# Patient Record
Sex: Male | Born: 1940 | Race: White | Hispanic: No | Marital: Married | Smoking: Former smoker
Health system: Southern US, Community
[De-identification: ages and names within clinical notes are randomized; demographics above are authoritative.]

## PROBLEM LIST (undated history)

## (undated) DIAGNOSIS — I10 Essential (primary) hypertension: Secondary | ICD-10-CM

## (undated) DIAGNOSIS — I219 Acute myocardial infarction, unspecified: Secondary | ICD-10-CM

## (undated) DIAGNOSIS — K56609 Unspecified intestinal obstruction, unspecified as to partial versus complete obstruction: Secondary | ICD-10-CM

## (undated) DIAGNOSIS — I2510 Atherosclerotic heart disease of native coronary artery without angina pectoris: Secondary | ICD-10-CM

## (undated) DIAGNOSIS — E785 Hyperlipidemia, unspecified: Secondary | ICD-10-CM

## (undated) DIAGNOSIS — R0609 Other forms of dyspnea: Secondary | ICD-10-CM

## (undated) DIAGNOSIS — Z720 Tobacco use: Secondary | ICD-10-CM

## (undated) DIAGNOSIS — J449 Chronic obstructive pulmonary disease, unspecified: Secondary | ICD-10-CM

## (undated) DIAGNOSIS — K219 Gastro-esophageal reflux disease without esophagitis: Secondary | ICD-10-CM

## (undated) DIAGNOSIS — R0600 Dyspnea, unspecified: Secondary | ICD-10-CM

## (undated) DIAGNOSIS — K625 Hemorrhage of anus and rectum: Secondary | ICD-10-CM

## (undated) HISTORY — DX: Tobacco use: Z72.0

## (undated) HISTORY — DX: Gastro-esophageal reflux disease without esophagitis: K21.9

## (undated) HISTORY — DX: Essential (primary) hypertension: I10

## (undated) HISTORY — DX: Atherosclerotic heart disease of native coronary artery without angina pectoris: I25.10

## (undated) HISTORY — DX: Hyperlipidemia, unspecified: E78.5

## (undated) HISTORY — PX: CORONARY ANGIOPLASTY WITH STENT PLACEMENT: SHX49

## (undated) HISTORY — DX: Dyspnea, unspecified: R06.00

## (undated) HISTORY — PX: PTCA: SHX146

## (undated) HISTORY — DX: Hemorrhage of anus and rectum: K62.5

## (undated) HISTORY — DX: Other forms of dyspnea: R06.09

---

## 1969-02-13 HISTORY — PX: EYE SURGERY: SHX253

## 2003-07-15 ENCOUNTER — Inpatient Hospital Stay (HOSPITAL_COMMUNITY): Admission: EM | Admit: 2003-07-15 | Discharge: 2003-07-18 | Payer: Self-pay | Admitting: Family Medicine

## 2003-08-24 ENCOUNTER — Encounter (HOSPITAL_COMMUNITY): Admission: RE | Admit: 2003-08-24 | Discharge: 2003-11-22 | Payer: Self-pay | Admitting: Cardiology

## 2003-11-23 ENCOUNTER — Encounter (HOSPITAL_COMMUNITY): Admission: RE | Admit: 2003-11-23 | Discharge: 2004-01-18 | Payer: Self-pay | Admitting: Cardiology

## 2003-12-22 ENCOUNTER — Ambulatory Visit: Payer: Self-pay | Admitting: Cardiology

## 2003-12-30 ENCOUNTER — Ambulatory Visit: Payer: Self-pay | Admitting: *Deleted

## 2004-01-19 ENCOUNTER — Encounter (HOSPITAL_COMMUNITY): Admission: RE | Admit: 2004-01-19 | Discharge: 2004-02-14 | Payer: Self-pay | Admitting: Cardiology

## 2004-03-30 ENCOUNTER — Ambulatory Visit: Payer: Self-pay

## 2004-07-06 ENCOUNTER — Ambulatory Visit: Payer: Self-pay | Admitting: *Deleted

## 2004-09-29 ENCOUNTER — Ambulatory Visit: Payer: Self-pay | Admitting: Internal Medicine

## 2004-09-30 ENCOUNTER — Ambulatory Visit: Payer: Self-pay | Admitting: Cardiology

## 2005-02-22 ENCOUNTER — Ambulatory Visit: Payer: Self-pay | Admitting: Cardiology

## 2005-11-06 ENCOUNTER — Emergency Department (HOSPITAL_COMMUNITY): Admission: EM | Admit: 2005-11-06 | Discharge: 2005-11-06 | Payer: Self-pay | Admitting: Family Medicine

## 2005-11-15 ENCOUNTER — Ambulatory Visit: Payer: Self-pay | Admitting: Cardiology

## 2005-11-21 ENCOUNTER — Ambulatory Visit: Payer: Self-pay | Admitting: Cardiology

## 2006-02-13 HISTORY — PX: RECTAL SURGERY: SHX760

## 2006-03-26 ENCOUNTER — Ambulatory Visit: Payer: Self-pay | Admitting: Cardiology

## 2006-04-02 ENCOUNTER — Ambulatory Visit (HOSPITAL_COMMUNITY): Admission: RE | Admit: 2006-04-02 | Discharge: 2006-04-02 | Payer: Self-pay | Admitting: General Surgery

## 2006-04-02 ENCOUNTER — Encounter (INDEPENDENT_AMBULATORY_CARE_PROVIDER_SITE_OTHER): Payer: Self-pay | Admitting: *Deleted

## 2006-05-29 ENCOUNTER — Ambulatory Visit: Payer: Self-pay | Admitting: Cardiology

## 2006-12-24 ENCOUNTER — Ambulatory Visit: Payer: Self-pay | Admitting: Cardiology

## 2006-12-24 LAB — CONVERTED CEMR LAB
ALT: 22 units/L (ref 0–53)
AST: 20 units/L (ref 0–37)
Albumin: 3.9 g/dL (ref 3.5–5.2)
Alkaline Phosphatase: 70 units/L (ref 39–117)
Cholesterol: 101 mg/dL (ref 0–200)
HDL: 26.7 mg/dL — ABNORMAL LOW (ref >39.0)
LDL Cholesterol: 57 mg/dL (ref 0–99)
Total CHOL/HDL Ratio: 3.8
Total Protein: 6.7 g/dL (ref 6.0–8.3)
VLDL: 18 mg/dL (ref 0–40)

## 2006-12-25 ENCOUNTER — Ambulatory Visit: Payer: Self-pay | Admitting: Cardiology

## 2006-12-25 ENCOUNTER — Ambulatory Visit: Payer: Self-pay

## 2008-01-15 ENCOUNTER — Ambulatory Visit: Payer: Self-pay | Admitting: Cardiology

## 2008-01-23 ENCOUNTER — Ambulatory Visit: Payer: Self-pay | Admitting: Cardiology

## 2008-01-23 LAB — CONVERTED CEMR LAB
Alkaline Phosphatase: 74 units/L (ref 39–117)
Bilirubin, Direct: 0.1 mg/dL (ref 0.0–0.3)
LDL Cholesterol: 81 mg/dL (ref 0–99)
Total Bilirubin: 0.6 mg/dL (ref 0.3–1.2)
Total CHOL/HDL Ratio: 4.1
VLDL: 12 mg/dL (ref 0–40)

## 2008-03-03 ENCOUNTER — Ambulatory Visit: Payer: Self-pay | Admitting: Cardiology

## 2008-03-03 LAB — CONVERTED CEMR LAB
Cholesterol: 114 mg/dL (ref 0–200)
HDL: 27.7 mg/dL — ABNORMAL LOW (ref >39.0)
LDL Cholesterol: 69 mg/dL (ref 0–99)

## 2008-03-05 ENCOUNTER — Ambulatory Visit: Payer: Self-pay | Admitting: Cardiology

## 2008-06-23 ENCOUNTER — Telehealth: Payer: Self-pay | Admitting: Cardiology

## 2008-06-28 ENCOUNTER — Emergency Department (HOSPITAL_COMMUNITY): Admission: EM | Admit: 2008-06-28 | Discharge: 2008-06-28 | Payer: Self-pay | Admitting: Family Medicine

## 2008-06-30 ENCOUNTER — Emergency Department (HOSPITAL_COMMUNITY): Admission: EM | Admit: 2008-06-30 | Discharge: 2008-06-30 | Payer: Self-pay | Admitting: Emergency Medicine

## 2008-11-05 ENCOUNTER — Ambulatory Visit: Payer: Self-pay | Admitting: Cardiology

## 2008-11-05 ENCOUNTER — Inpatient Hospital Stay (HOSPITAL_COMMUNITY): Admission: EM | Admit: 2008-11-05 | Discharge: 2008-11-06 | Payer: Self-pay | Admitting: Emergency Medicine

## 2008-11-09 ENCOUNTER — Telehealth (INDEPENDENT_AMBULATORY_CARE_PROVIDER_SITE_OTHER): Payer: Self-pay | Admitting: *Deleted

## 2009-01-21 DIAGNOSIS — F17200 Nicotine dependence, unspecified, uncomplicated: Secondary | ICD-10-CM | POA: Insufficient documentation

## 2009-01-21 DIAGNOSIS — E785 Hyperlipidemia, unspecified: Secondary | ICD-10-CM | POA: Insufficient documentation

## 2009-01-21 DIAGNOSIS — R0989 Other specified symptoms and signs involving the circulatory and respiratory systems: Secondary | ICD-10-CM

## 2009-01-21 DIAGNOSIS — I10 Essential (primary) hypertension: Secondary | ICD-10-CM

## 2009-01-21 DIAGNOSIS — K219 Gastro-esophageal reflux disease without esophagitis: Secondary | ICD-10-CM | POA: Insufficient documentation

## 2009-01-21 DIAGNOSIS — I2510 Atherosclerotic heart disease of native coronary artery without angina pectoris: Secondary | ICD-10-CM | POA: Insufficient documentation

## 2009-01-21 DIAGNOSIS — R0609 Other forms of dyspnea: Secondary | ICD-10-CM | POA: Insufficient documentation

## 2009-01-22 ENCOUNTER — Ambulatory Visit: Payer: Self-pay | Admitting: Cardiology

## 2009-01-27 ENCOUNTER — Ambulatory Visit: Payer: Self-pay | Admitting: Cardiology

## 2009-01-27 DIAGNOSIS — E860 Dehydration: Secondary | ICD-10-CM | POA: Insufficient documentation

## 2009-01-27 DIAGNOSIS — J449 Chronic obstructive pulmonary disease, unspecified: Secondary | ICD-10-CM | POA: Insufficient documentation

## 2009-01-27 LAB — CONVERTED CEMR LAB
BUN: 18 mg/dL (ref 6–23)
Chloride: 103 meq/L (ref 96–112)
Creatinine, Ser: 1.1 mg/dL (ref 0.4–1.5)
Sodium: 137 meq/L (ref 135–145)

## 2009-07-15 ENCOUNTER — Ambulatory Visit: Payer: Self-pay | Admitting: Cardiology

## 2009-07-21 ENCOUNTER — Ambulatory Visit: Payer: Self-pay | Admitting: Cardiology

## 2009-07-25 LAB — CONVERTED CEMR LAB
ALT: 26 units/L (ref 0–53)
AST: 20 units/L (ref 0–37)
Alkaline Phosphatase: 74 units/L (ref 39–117)
Bilirubin, Direct: 0.1 mg/dL (ref 0.0–0.3)
LDL Cholesterol: 77 mg/dL (ref 0–99)
Total CHOL/HDL Ratio: 4
Triglycerides: 92 mg/dL (ref 0.0–149.0)
VLDL: 18.4 mg/dL (ref 0.0–40.0)

## 2009-07-27 ENCOUNTER — Encounter: Payer: Self-pay | Admitting: Cardiology

## 2010-03-01 ENCOUNTER — Ambulatory Visit
Admission: RE | Admit: 2010-03-01 | Discharge: 2010-03-01 | Payer: Self-pay | Source: Home / Self Care | Attending: Cardiology | Admitting: Cardiology

## 2010-03-01 ENCOUNTER — Encounter: Payer: Self-pay | Admitting: Cardiology

## 2010-03-09 ENCOUNTER — Ambulatory Visit: Admit: 2010-03-09 | Payer: Self-pay | Admitting: Cardiology

## 2010-03-15 NOTE — Letter (Signed)
Summary: [facility] - Lipid  [facility], Main Office  [address]   [place], [place] [address]   Phone: [phone]  Fax: [phone]     July 27, 2009 MRN: [id number]   Austin [name] [address] [place], [place]  [address]   Dear Mr. Savant,  We have reviewed your cholesterol results.  They are as follows:     Total Cholesterol:    129 (Desirable: less than 200)       HDL  Cholesterol:     33.50  (Desirable: greater than 40 for men and 50 for women)       LDL Cholesterol:       77  (Desirable: less than 100 for low risk and less than 70 for moderate to high risk)       Triglycerides:       92.0  (Desirable: less than 150)  Our recommendations include:  You are near target on your LDL. Cholesterol results look good. Liver function is normal.  Continue current medications.  Dr Riley Kill would like you to have labs rechecked in one year. Lauren RN   Call our office at the number listed above if you have any questions.  Lowering your LDL cholesterol is important, but it is only one of a large number of "risk factors" that may indicate that you are at risk for heart disease, stroke or other complications of hardening of the arteries.  Other risk factors include:   A.  Cigarette Smoking* B.  High Blood Pressure* C.  Obesity* D.   Low HDL Cholesterol (see yours above)* E.   Diabetes Mellitus (higher risk if your is uncontrolled) F.  Family history of premature heart disease G.  Previous history of stroke or cardiovascular disease    *These are risk factors YOU HAVE CONTROL OVER.  For more information, visit .  There is now evidence that lowering the TOTAL CHOLESTEROL AND LDL CHOLESTEROL can reduce the risk of heart disease.  The American Heart Association recommends the following guidelines for the treatment of elevated cholesterol:  1.  If there is now current heart disease and less than two risk factors, TOTAL CHOLESTEROL should be less than 200 and LDL  CHOLESTEROL should be less than 100. 2.  If there is current heart disease or two or more risk factors, TOTAL CHOLESTEROL should be less than 200 and LDL CHOLESTEROL should be less than 70.  A diet low in cholesterol, saturated fat, and calories is the cornerstone of treatment for elevated cholesterol.  Cessation of smoking and exercise are also important in the management of elevated cholesterol and preventing vascular disease.  Studies have shown that 30 to 60 minutes of physical activity most days can help lower blood pressure, lower cholesterol, and keep your weight at a healthy level.  Drug therapy is used when cholesterol levels do not respond to therapeutic lifestyle changes (smoking cessation, diet, and exercise) and remains unacceptably high.  If medication is started, it is important to have you levels checked periodically to evaluate the need for further treatment options.  Thank you,    [employer] Team

## 2010-03-15 NOTE — Assessment & Plan Note (Signed)
Summary: Austin [name]   Visit Type:  6 months follow up  CC:  No cardiac complains.  History of Present Illness: Stays pretty busy.  Does honor guard, racing, and works at hospital.  Expressed a desire to come off of plavix at this point in time.  We discussed today in great detail.  He has three DES  (one Cypher, two TAXUS Express 2).  Denies chest pain at all.  The indication for stenting was not STEMI. THe RCA was 32 mm in length.  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    j Current Medications (verified): 1)  Altace 2.5 Mg Caps (Ramipril) .Marland Kitchen.. 1 By Mouth Daily 2)  Lipitor 40 Mg Tabs (Atorvastatin Calcium) .... Take 1 Tablet By Mouth At Bedtime 3)  Nitroglycerin 0.4 Mg Subl (Nitroglycerin) .... Place 1 Tablet Under Tongue As Directed 4)  Plavix 75 Mg Tabs (Clopidogrel Bisulfate) .... Take 1 Tablet By Mouth Once A Day 5)  Toprol Xl 50 Mg Xr24h-Tab (Metoprolol Succinate) .... Take 1 Tablet By Mouth Once A Day 6)  Aspirin 81 Mg Tbec (Aspirin) .... Take One Tablet By Mouth Daily  Allergies (verified): No Known Drug Allergies  Vital Signs:  Patient profile:   70 year old male Height:      71 inches Weight:      147 pounds BMI:     20.58 Pulse rate:   58 / minute Pulse rhythm:   regular Resp:     18 per minute BP sitting:   116 / 64  (left arm) Cuff size:   regular  Vitals Entered By: Vikki Ports (July 15, 2009 11:36 AM)  Physical Exam  General:  Well developed, well nourished, in no acute distress. Head:  normocephalic and atraumatic Eyes:  PERRLA/EOM intact; conjunctiva and lids normal. Neck:  Neck supple, no JVD. No masses, thyromegaly or abnormal cervical nodes. Chest Wall:  no deformities or breast masses noted Lungs:  Slight decrease in breath sounds.   Heart:  PMI non displaced. Normal  Skin:  multiple bruises both arms.   EKG  Procedure date:  07/15/2009  Findings:      SB.  Otherwise normal.  Impression &  Recommendations:  Problem # 1:  CAD (ICD-414.00)  Anatomy, indications, DES type all reviewed with patient, along with studies.   Not a STEMI  indication.   Now out about six years from initial implant.  Lots of bruising noted.  Will stop Plavix per pateint request, and also increase ASA to 162mg  per day.  Guidelines  (1 year total) reviewed in detail.   His updated medication list for this problem includes:    Altace 2.5 Mg Caps (Ramipril) .Marland Kitchen... 1 by mouth daily    Nitroglycerin 0.4 Mg Subl (Nitroglycerin) .Marland Kitchen... Place 1 tablet under tongue as directed    Plavix 75 Mg Tabs (Clopidogrel bisulfate) .Marland Kitchen... Take 1 tablet by mouth once a day    Toprol Xl 50 Mg Xr24h-tab (Metoprolol succinate) .Marland Kitchen... Take 1 tablet by mouth once a day    Aspirin 81 Mg Tbec (Aspirin) .Marland Kitchen... Take one tablet by mouth daily  Orders: EKG w/ Interpretation (93000)  Problem # 2:  HYPERLIPIDEMIA (ICD-272.4) Need to check lipid and liver profile.   His updated medication list for this problem includes:    Lipitor 40 Mg Tabs (Atorvastatin calcium) .Marland Kitchen... Take 1 tablet by mouth at bedtime  His updated medication list for this problem includes:  Lipitor 40 Mg Tabs (Atorvastatin calcium) .Marland Kitchen... Take 1 tablet by mouth at bedtime  Patient Instructions: 1)  Your physician recommends that you return for a FASTING LIPID and LIVER Profile (414.01, 272.0)---Nothing to eat or drink after midnight 2)  Your physician has recommended you make the following change in your medication: STOP Plavix when you finish your current supply, INCREASE Aspirin to 162mg  once a day (2 baby aspirin) when you STOP Plavix 3)  Your physician wants you to follow-up in:   6 MONTHS. You will receive a reminder letter in the mail two months in advance. If you don't receive a letter, please call our office to schedule the follow-up appointment. Prescriptions: TOPROL XL 50 MG XR24H-TAB (METOPROLOL SUCCINATE) Take 1 tablet by mouth once a day  #90 x 3   Entered  by:   Julieta Gutting, RN, BSN   Authorized by:   Ronaldo Miyamoto, MD, [facility]   Signed by:   Julieta Gutting, RN, BSN on 07/15/2009   Method used:   Electronically to        [facility]* (mail-order)             ,          Ph: [phone]       Fax: [phone]   RxID:   [id number] LIPITOR 40 MG TABS (ATORVASTATIN CALCIUM) Take 1 tablet by mouth at bedtime  #90 x 3   Entered by:   Julieta Gutting, RN, BSN   Authorized by:   Ronaldo Miyamoto, MD, [facility]   Signed by:   Julieta Gutting, RN, BSN on 07/15/2009   Method used:   Electronically to        [facility] [employer] [facility]* (mail-order)             ,          Ph: [phone]       Fax: [phone]   RxID:   [id number] ALTACE 2.5 MG CAPS (RAMIPRIL) 1 by mouth daily  #90 x 3   Entered by:   Julieta Gutting, RN, BSN   Authorized by:   Ronaldo Miyamoto, MD, [facility]   Signed by:   Julieta Gutting, RN, BSN on 07/15/2009   Method used:   Electronically to        [facility] [facility]* (mail-order)             ,          Ph: [phone]       Fax: [phone]   RxID:   [id number]

## 2010-03-17 NOTE — Assessment & Plan Note (Signed)
Summary: Austin [name]   Visit Type:  6 months follow up  CC:  No complaints.  History of Present Illness: Patient is doing very well.  He is not having any problem.  He denies any chest pain.  He has been off of clopidogrel for about six months without any problems.      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 1 By Mouth Daily 2)  Lipitor 40 Mg Tabs (Atorvastatin Calcium) .... Take 1 Tablet By Mouth At Bedtime 3)  Nitroglycerin 0.4 Mg Subl (Nitroglycerin) .... Place 1 Tablet Under Tongue As Directed 4)  Toprol Xl 50 Mg Xr24h-Tab (Metoprolol Succinate) .... Take 1 Tablet By Mouth Once A Day 5)  Aspirin 81 Mg Tbec (Aspirin) .... Take One Tablet By Mouth Daily  Allergies (verified): No Known Drug Allergies  Vital Signs:  Patient profile:   70 year old male Height:      71 inches Weight:      150.25 pounds BMI:     21.03 Pulse rate:   57 / minute Pulse rhythm:   regular Resp:     18 per minute BP sitting:   140 / 70  (left arm) Cuff size:   large  Vitals Entered By: Vikki Ports (March 01, 2010 4:02 PM)  Physical Exam  General:  Well developed, well nourished, in no acute distress. Head:  normocephalic and atraumatic Eyes:  PERRLA/EOM intact; conjunctiva and lids normal. Lungs:  Clear bilaterally to auscultation and percussion. Heart:  PMi non displaced.  Normal S1 and S2.  No murmur, rub, or gallop.   Pulses:  pulses normal in all 4 extremities Extremities:  No clubbing or cyanosis. Neurologic:  Alert and oriented x 3.   EKG  Procedure date:  03/01/2010  Findings:      NSR with PAC's.  No acute changes.    Impression & Recommendations:  Problem  # 1:  CAD (ICD-414.00) Doing well since last visit.  Will continue to follow at one year.  The following medications were removed from the medication list:    Plavix 75 Mg Tabs (Clopidogrel bisulfate) .Marland Kitchen... Take 1 tablet by mouth once a day His updated medication list for this problem includes:    Altace 2.5 Mg Caps (Ramipril) .Marland Kitchen... 1 by mouth daily    Nitroglycerin 0.4 Mg Subl (Nitroglycerin) .Marland Kitchen... Place 1 tablet under tongue as directed    Toprol Xl 50 Mg Xr24h-tab (Metoprolol succinate) .Marland Kitchen... Take 1 tablet by mouth once a day    Aspirin 81 Mg Tbec (Aspirin) .Marland Kitchen... Take one tablet by mouth daily  Orders: EKG w/ Interpretation (93000)  Problem # 2:  HYPERLIPIDEMIA (ICD-272.4) near target six months ago.  Gets primary care at [facility]). His updated medication list for this problem includes:    Lipitor 40 Mg Tabs (Atorvastatin calcium) .Marland Kitchen... Take 1 tablet by mouth at bedtime  Problem # 3:  HYPERTENSION, UNSPECIFIED (ICD-401.9) on ACE, so will check BMET.  Pt agreeable to return for followup.  His updated medication list for this problem includes:    Altace 2.5 Mg Caps (Ramipril) .Marland Kitchen... 1 by mouth daily    Toprol Xl 50 Mg Xr24h-tab (  Metoprolol succinate) .Marland Kitchen... Take 1 tablet by mouth once a day    Aspirin 81 Mg Tbec (Aspirin) .Marland Kitchen... Take one tablet by mouth daily  Orders: EKG w/ Interpretation (93000)  Patient Instructions: 1)  Your physician recommends that you return for lab work : BMP --lab hours 8:30-2:00 and 2:30-4:45 (401.9, 414.01, 272.0) 2)  Your physician wants you to follow-up in: 6 MONTHS.  You will receive a reminder letter in the mail two months in advance. If you don't receive a letter, please call our office to schedule the follow-up appointment. 3)  Your physician recommends that you continue on your current medications as directed. Please refer to the Current Medication list given to you today.

## 2010-03-21 ENCOUNTER — Other Ambulatory Visit (INDEPENDENT_AMBULATORY_CARE_PROVIDER_SITE_OTHER): Payer: Medicare Other

## 2010-03-21 ENCOUNTER — Encounter (INDEPENDENT_AMBULATORY_CARE_PROVIDER_SITE_OTHER): Payer: Self-pay | Admitting: *Deleted

## 2010-03-21 ENCOUNTER — Other Ambulatory Visit: Payer: Self-pay | Admitting: Cardiology

## 2010-03-21 DIAGNOSIS — I2510 Atherosclerotic heart disease of native coronary artery without angina pectoris: Secondary | ICD-10-CM

## 2010-03-21 DIAGNOSIS — I10 Essential (primary) hypertension: Secondary | ICD-10-CM

## 2010-03-21 DIAGNOSIS — E785 Hyperlipidemia, unspecified: Secondary | ICD-10-CM

## 2010-03-21 LAB — BASIC METABOLIC PANEL
CO2: 27 mEq/L (ref 19–32)
Chloride: 104 mEq/L (ref 96–112)
Creatinine, Ser: 1 mg/dL (ref 0.4–1.5)
Sodium: 139 mEq/L (ref 135–145)

## 2010-05-20 LAB — BASIC METABOLIC PANEL
BUN: 20 mg/dL (ref 6–23)
Calcium: 9.6 mg/dL (ref 8.4–10.5)
Creatinine, Ser: 1.79 mg/dL — ABNORMAL HIGH (ref 0.4–1.5)
GFR calc non Af Amer: >60 mL/min (ref >60)
Potassium: 4.2 mEq/L (ref 3.5–5.1)
Potassium: 4.6 mEq/L (ref 3.5–5.1)
Sodium: 139 mEq/L (ref 135–145)

## 2010-05-20 LAB — CBC
HCT: 36 % — ABNORMAL LOW (ref 39.0–52.0)
HCT: 41 % (ref 39.0–52.0)
Hemoglobin: 14.1 g/dL (ref 13.0–17.0)
MCHC: 35.1 g/dL (ref 30.0–36.0)
MCV: 95.2 fL (ref 78.0–100.0)
MCV: 95.8 fL (ref 78.0–100.0)
Platelets: 209 10*3/uL (ref 150–400)
Platelets: 226 10*3/uL (ref 150–400)
RBC: 3.78 MIL/uL — ABNORMAL LOW (ref 4.22–5.81)
RDW: 13 % (ref 11.5–15.5)

## 2010-05-20 LAB — URINALYSIS, ROUTINE W REFLEX MICROSCOPIC
Glucose, UA: NEGATIVE mg/dL
Ketones, ur: NEGATIVE mg/dL
Nitrite: NEGATIVE
Urobilinogen, UA: 1 mg/dL (ref 0.0–1.0)

## 2010-05-20 LAB — CARDIAC PANEL(CRET KIN+CKTOT+MB+TROPI)
CK, MB: 1.2 ng/mL (ref 0.3–4.0)
CK, MB: 1.4 ng/mL (ref 0.3–4.0)
Relative Index: 1.2 (ref 0.0–2.5)
Total CK: 116 U/L (ref 7–232)
Total CK: 121 U/L (ref 7–232)

## 2010-05-20 LAB — LIPID PANEL: VLDL: 9 mg/dL (ref 0–40)

## 2010-05-20 LAB — TROPONIN I: Troponin I: 0.01 ng/mL (ref 0.00–0.06)

## 2010-05-20 LAB — DIFFERENTIAL
Basophils Absolute: 0.1 10*3/uL (ref 0.0–0.1)
Eosinophils Absolute: 0.1 10*3/uL (ref 0.0–0.7)
Lymphocytes Relative: 7 % — ABNORMAL LOW (ref 12–46)
Lymphs Abs: 1.4 10*3/uL (ref 0.7–4.0)
Monocytes Relative: 7 % (ref 3–12)
Neutrophils Relative %: 85 % — ABNORMAL HIGH (ref 43–77)

## 2010-05-20 LAB — APTT: aPTT: 27 seconds (ref 24–37)

## 2010-05-20 LAB — POCT CARDIAC MARKERS

## 2010-05-20 LAB — CK TOTAL AND CKMB (NOT AT ARMC): Total CK: 156 U/L (ref 7–232)

## 2010-05-20 LAB — BRAIN NATRIURETIC PEPTIDE: Pro B Natriuretic peptide (BNP): <30 pg/mL (ref 0.0–100.0)

## 2010-05-24 LAB — CULTURE, ROUTINE-ABSCESS: Gram Stain: NONE SEEN

## 2010-06-28 NOTE — Assessment & Plan Note (Signed)
[facility]                            CARDIOLOGY OFFICE NOTE   POOKELA, SELLIN                       MRN:          [id number]  DATE:01/15/2008                            DOB:          1940-10-13    [name] is in for followup.  He is doing extremely well.  He  continues to see Dr. __________ at the [facility] for his general medical  care.  They do a lot of his laboratory studies.  He likes his current  lipid status, and we talked about the possibility of using niacin.  He  is no longer on Zetia.   His current medications include:  1. Lipitor 40 mg daily.  2. Prilosec over the counter p.r.n.  3. Altace 5 mg daily.  4. Toprol-XL 50 daily.  5. Plavix 75 mg daily.  6. Aspirin 81 mg daily.   On physical, the blood pressure is 110/60, pulse is 51.  The lung fields  are clear.  The cardiac rhythm is regular.  The weight is 150.   EKG reveals sinus bradycardia, otherwise unremarkable.   IMPRESSION:  1. Coronary disease with prior multivessel coronary intervention with      prior right coronary artery intervention in the right followed by      proximal circumflex and obtuse marginal by Dr. Samule Ohm with Cypher      stents and TAXUS.  2. Hypercholesterolemia with low HDL.  3. Hypertension.  4. History of mildly elevated liver enzymes.  5. History of exercise-induced asymptomatic paroxysmal atrial      fibrillation at the end of routine treadmill testing.  6. History of hemorrhoids, status post surgery.   PLAN:  1. Continue current medical regimen.  2. Continue followup at the [place].  3. We will obtain lipid and liver profile.  4. He and I had a discussion about his Prilosec.  He only uses it very      sparingly, so I am less concerned about the Plavix and omeprazole      interaction, but he and I actually in fact did discuss this.   ADDENDUM  He will return to clinic in 1 year.      [name], MD, [facility]  Electronically  Signed    TDS/MedQ  DD: 01/15/2008  DT: 01/16/2008  Job #: [id number]

## 2010-06-28 NOTE — Assessment & Plan Note (Signed)
[facility]                            CARDIOLOGY OFFICE NOTE   WEAVER, TWEED                       MRN:          [id number]  DATE:03/05/2008                            DOB:          1940-12-03    Austin [name] is in for followup.  He really is doing quite well.  He  repeated his lipid profile.  His total cholesterol is 114, his HDL is  27, his LDL is 69.  His preference is to not add any more drug therapy.   He denies any ongoing chest pain.  He actually feels fairly well.   MEDICATIONS:  1. Lipitor 40 mg daily.  2. Altace 5 mg daily.  3. Toprol-XL 50 mg daily.  4. Plavix 75 mg daily.  5. Aspirin 81 mg daily.   PHYSICAL EXAMINATION:  GENERAL:  He is alert and oriented gentleman in  no distress.  VITAL SIGNS:  Blood pressure is 116/68, pulse is 60.  LUNGS:  Fields are clear.  CARDIAC:  Rhythm is regular.  There is no definite murmur.  EXTREMITIES:  No edema.   IMPRESSION:  1. Coronary artery disease status post multivessel percutaneous      coronary intervention.  2. Hypercholesterolemia with low HDL.  3. Hypertension.  4. History of previously elevated liver enzymes, normal most recently.  5. History of hemorrhoids, status post surgery.   DISPOSITION:  1. Continued medical therapy.  2. Return to clinic in 1 year.  3. Continued followup at the [facility].     [name], MD, [facility]  Electronically Signed    TDS/MedQ  DD: 03/05/2008  DT: 03/06/2008  Job #: [id number]

## 2010-06-28 NOTE — Procedures (Signed)
[facility]                              EXERCISE TREADMILL   NAME:[name], Austin A                       MRN:          [id number]  DATE:12/25/2006                            DOB:          1940-11-18    Duration of exercise:  Eleven minutes, 15 seconds.  Maximum heart rate:  130.  Percent of PMHR:  83%.   COMMENTS:  [name] exercised today on the Bruce protocol.  Exercise  tolerance was fairly good.  He experienced no chest pain.  Test was  terminated due to some fatigue.  There was minimal scooping of the ST  segments, but no horizontal ST depression.  There was no evidence of  high-grade ischemia by exercise tolerance testing.   RISK SUMMATION:  The patient has previously undergone percutaneous  intervention in the setting of myocardial infarction.  He had drug-  eluting intervention on July 15, 2003, in the right coronary artery.  At  that time, he had a Taxus platform placed.  This was then followed by  percutaneous intervention of the proximal circumflex and obtuse marginal  by Dr. Samule Ohm on July 17, 2003, and at that time, had a Cypher placed in  the obtuse marginal and then a Taxus placed in the proximal marginal.  He has done well since that time.  The current study does not suggest  ongoing ischemia.  He has tolerated aspirin and Plavix.  Most recent  lipid profile, done today, reveals an LDL of 57.  Triglycerides are  intact.  Liver function studies are normal.  Overall, HDL is low at 26.  In February of this year, when the patient had surgery, the hemoglobin  was 14.9, hematocrit was 44, renal function was normal with a potassium  of 4.5.  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   Overall, the patient has done well and remains stable.  He will remain  on aspirin and Plavix because of multiple stents and remain on aspirin  81 mg.  Should he have problems,  he is to call us and otherwise we will  see him back in followup in one year.     [name], MD, [facility]  Electronically Signed    TDS/MedQ  DD: 12/25/2006  DT: 12/26/2006  Job #: [id number]   cc:   Dr. Delorise Shiner, [facility] [place]

## 2010-07-01 NOTE — Assessment & Plan Note (Signed)
[facility]                            CARDIOLOGY OFFICE NOTE   Austin [name], Austin [name]                       MRN:          [id number]  DATE:03/26/2006                            DOB:          [birth year]    CARDIOLOGIST:  Dr. Bonnee [name]   HISTORY OF PRESENT ILLNESS:  Austin [name] is a 70 year old male patient  followed by Dr. 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  He subsequently underwent staged intervention of the circumflex  and obtuse marginal with a Cypher drug-eluting stent to the obtuse  marginal and a Taxus drug-eluting stent to the circumflex.  He has done  well since that time.  He denies any chest pain or shortness of breath,  syncope or near syncope.  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  He denies any orthopnea or  paroxysmal exertional dyspnea.  He denies any arm or jaw discomfort with  exertion. Denies any lower extremity edema.  Denies any palpitations.  He apparently needs some hemorrhoid surgery.  He needs cardiac clearance  and is referred to our office today by Dr. Earlene [name].   PAST MEDICAL HISTORY:  Please see the problem list below.   MEDICATIONS:  1. Lipitor 40 mg daily.  2. Prilosec daily.  3. Altace 75 mg daily.  4. Toprol XL 50 mg daily.  5. Zetia 10 mg nightly.  6. Plavix 75 mg daily.  7. Aspirin 81 mg daily.  8. Zyrtec 10 mg daily.  9. Nitroglycerin p.r.n. chest pain.   ALLERGIES:  No known drug allergies.   SOCIAL HISTORY:  The patient quit smoking 2005.   FAMILY HISTORY:  Insignificant for coronary artery disease.   REVIEW OF SYSTEMS:  Please see the HPI.  Denies any fevers, chills,  cough, melena, hematuria, dysuria, nausea, vomiting, diarrhea.  He does  note occasional bright red blood per rectum secondary to his  hemorrhoids.  The rest of the review of systems are  negative.   PHYSICAL EXAMINATION:  He is a well-nourished, well-developed male in no  acute distress.  Blood pressure is 147/70, pulse 69, weight 150 pounds.  HEAD:  Normocephalic, atraumatic.  EYES:  PERRLA, EOMI. Sclerae are clear.  NECK:  Without JV.  LYMPH:  Without lymphadenopathy.  ENDOCRINE:  Without thyromegaly.  Carotids without bruits bilaterally.  CARDIAC:  Normal S1, S2.  Regular rate and rhythm.  LUNGS:  Are clear to auscultation bilaterally without wheezing, rhonchi  or rales.  ABDOMEN:  Soft, nontender with normoactive bowel sounds, no  organomegaly.  EXTREMITIES:  Without edema.  Calves are soft, nontender.  NEUROLOGIC:  He is alert and oriented x3.  Cranial nerves II through XII  grossly intact.   Electrocardiogram reveals sinus bradycardia with a heart rate of 57,  normal axis, no acute changes.   IMPRESSION:  1. Coronary artery disease.      a.     Status post acute inferior non ST elevation myocardial  infarction June 2005 treated with Taxus drug-eluting stent to the       right coronary artery.      b.     Status post staged intervention with a drug-eluting stent to       the circumflex and first obtuse marginal June 2005.  2. Preserved left ventricular function.  3. Treated dyslipidemia.  4. Hypertension.  5. History of mildly elevated liver enzymes.  6. History of exercise-induced asymptomatic paroxysmal atrial      fibrillation at the time of routine treadmill test.      a.     Maintaining sinus rhythm.  7. Hemorrhoids.      a.     Needs surgery.   PLAN:  The patient presents to the office today for surgical clearance  for hemorrhoid surgery in the near future.  He has no symptoms of  angina.  According to [facility] and AHA guidelines, he requires no further  cardiac workup and should be at acceptable risk.  He does however have 3  drug-eluting stents that were placed in June 2005.  He has remained on  Plavix and aspirin since that time.  I  discussed the case with Dr.  Riley [name] who notes that the patient should remain on 1 antiplatelet  agent.  We recommend he remain on aspirin throughout the perioperative  period.  It should be okay for him to hold Plavix.  He can hold Plavix 3  days prior to his procedure and start it after the procedure as soon as  possible.  He should also remain on his beta blocker  throughout the perioperative period.  Our service will certainly be  available as necessary.  I did speak to Dr. Earlene [name] over the telephone  today regarding the issues with Plavix and aspirin.      Austin Newcomer, PA-C  Electronically Signed      Austin [name]. Austin Kill, MD, [facility]  Electronically Signed   SW/MedQ  DD: 03/26/2006  DT: 03/26/2006  Job #: [id number]   cc:   Austin [name].D.

## 2010-07-01 NOTE — Assessment & Plan Note (Signed)
[facility]                            CARDIOLOGY OFFICE NOTE   VIC, ESCO                       MRN:          [id number]  DATE:05/29/2006                            DOB:          [birth year]    [name] is in for followup.  In general, he is stable, he has not  been having any chest pain.  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  He has not  had recurrent chest discomfort.  The patient has had hemorrhoid surgery  since I saw him last, and has had complete resolution of his problems.   MEDICATIONS:  1. Lipitor 40 mg daily.  2. Prilosec over-the-counter.  3. Altace 5 mg daily.  4. Toprol-XL 15 mg daily.  5. Zetia 10 mg q.h.s.  6. Plavix 75 mg daily.  7. Aspirin 81 mg daily.   PHYSICAL:  He is a well-appearing gentleman.  The weight is 151 pounds,  the blood pressure 110/64 and the pulse is 68.  There are no carotid  bruits.  PMI is nondisplaced.  There is a normal first and second heart  sound without murmur, rub or gallop.  EXTREMITIES:  No edema.   IMPRESSION:  1. Coronary artery disease with prior percutaneous coronary      intervention in June of 2005, with percutaneous intervention of the      right coronary artery with a drug-eluting stent, and subsequent      percutaneous intervention of the circumflex coronary artery in both      the first obtuse marginal and proximal circumflex by Dr. Samule Ohm.  2. Hypercholesterolemia.  3. Hypertension.  4. History of borderline elevated liver enzymes.  5. History of hemorrhoids.  6. History of atrial fibrillation at time of exercise testing in the      past.   PLAN:  1. We will repeat his exercise test in July of this year.  2. Continue current medical regimen.  3. Discussion today regarding the role of Zetia.  4. Lipid and liver profile within the next couple of months.     Austin [name]. Riley Kill, MD, [facility]  Electronically Signed    TDS/MedQ  DD: 05/29/2006  DT: 05/29/2006  Job #: [id number]

## 2010-07-01 NOTE — Op Note (Signed)
NAME:  Austin [name], Austin [name] NO.:  [id number]   MEDICAL RECORD NO.:  [id number]          PATIENT TYPE:  AMB   LOCATION:  DAY                          FACILITY:  [facility]   PHYSICIAN:  Timothy E. Earlene Plater, M.D. DATE OF BIRTH:  October 30, 1940   DATE OF PROCEDURE:  04/02/2006  DATE OF DISCHARGE:                               OPERATIVE REPORT   PREOPERATIVE DIAGNOSIS:  Hemorrhoids with prolapse, mucosal prolapse  syndrome.   POSTOPERATIVE DIAGNOSIS:  Hemorrhoids with prolapse, mucosal prolapse  syndrome.   PROCEDURE:  Examination under anesthesia and closed surgical  hemorrhoidectomy.   SURGEON:  Kendrick Ranch, MD   ANESTHESIA:  General.   Mr. Okelley is 38, thin, considers himself healthy.  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  Riley Kill.  We do have his clearance for this procedure.  The patient has  been on long-term aspirin and Plavix.  We had agreed to stop the Plavix  3 days prior to surgery and continue aspirin through the surgery.  Risk  of bleeding to the patient has been thoroughly explained.  The patient,  for many years, has had hemorrhoids.  He continued that until he now has  chronic fourth-degree hemorrhoids with continuous prolapse, bleeding,  soilage, difficulty cleansing, pain.  His bowel habits are good.  He  says he does not strain.  So the surgery has been carefully recommended  and he has agreed and signed the permit.   The patient was taken to the operating room and placed supine.  General  LMA anesthesia provided.  The patient placed in lithotomy.  Perianal  area inspected, prepped and draped.  Hemorrhoids were enormous, left  lateral, right posterior, right anterior with constant prolapse even  under general anesthesia.  I injected 25 mL of Marcaine mixed with  Wydase and massaged that in for a long time to try to get the  hemorrhoids to reduce.  Finally, they did shrink up some and could be  reduced with  the anoscope in place.  I had to make a decision about a  PPH versus a standard surgical closed hemorrhoidectomy.  The PPH would  have helped his mucosal prolapse syndrome but does not get rid of the  distal mucosal prolapse which is his main complaint.  A surgical  hemorrhoidectomy would get rid of the hemorrhoidal complex and the  distal portion but perhaps will not treat the mucosal prolapse syndrome  quite as effectively.  With the amount of tissue he had distally, I  elected to proceed with a surgical hemorrhoidectomy.  I approached left  lateral then right posterior each in the same fashion, a 2-0 chromic  suture ligature placed at the apex; the hemorrhoid was carefully grasped  between clamps and then I made a skinny elliptical incision around the  hemorrhoid, care taken to avoid removing much anoderm but widening the  lips both around the hemorrhoidal complex internally and on the skin  externally.  These were carefully dissected out and the hemorrhoid  removed, the wound closed with a running 2-0 chromic down to the  skin  where undermining of the edges removed all the large bolus superficial  varicosities.  This sufficed nicely for the left lateral, right  posterior.  Because the right anterior was not as large and I hesitated  make a third incision, I simply put a rubber band ligation on the  internal component of the right anterior.  All wounds were checked.  There was no bleeding or complication.  Gelfoam wrapped in Surgicel was  placed both inside and outside the rectal vault, and the procedure was  complete, all counts correct, and the patient was removed to recovery  room in good condition.   He will be followed closely as an outpatient, long-term, to follow his  progress in regard to the mucosal prolapse syndrome.      Timothy E. Earlene Plater, M.D.  Electronically Signed     TED/MEDQ  D:  04/02/2006  T:  04/02/2006  Job:  [id number]   cc:   Petra Kuba, M.D.  Fax:  [phone]   [name], MD, [facility]  [address]  [address]  [place]  [place] [address]

## 2010-07-01 NOTE — Cardiovascular Report (Signed)
NAMEKIMBALL, APPLEBY NO.:  [id number]   MEDICAL RECORD NO.:  [id number]                   PATIENT TYPE:  INP   LOCATION:  2908                                 FACILITY:  [facility]   PHYSICIAN:  [name], M.D. [facility]         DATE OF BIRTH:  1940/11/06   DATE OF PROCEDURE:  07/15/2003  DATE OF DISCHARGE:                              CARDIAC CATHETERIZATION   INDICATIONS:  Mr. Cansler is a pleasant gentleman who presents with an acute  coronary syndrome.  He has inferolateral T-wave inversion and chest pain.  He has been having off and on chest pain for about a month.  He was seen by  Dr. Chales Abrahams in the emergency room and subsequently admitted for early  evaluation.   PROCEDURE:  1. Left heart catheterization.  2. Selective coronary arteriography.  3. Selective left ventriculography.  4. Percutaneous coronary intervention in the right coronary artery.   DESCRIPTION OF THE PROCEDURE:  The patient was brought to the cath lab and  prepped and draped in the usual fashion.  Through an anterior puncture the  right femoral artery was easily entered.  A 6 French sheath was initially  placed.  Views of the left and right coronaries were obtained in multiple  angiographic projections.  Central aortic and left ventricular pressures  were measured with the pigtail.  Ventriculography was performed in the  biplane manner.  Following a pressure pullback, the pigtail catheter was  removed.  The patient had significant disease of the circumflex, but  subtotal occlusion of the right coronary artery with some slow antegrade  flow.  Based on this, it was our feeling that we should address the right  coronary artery first.  I discussed this with the patient and he was  agreeable.  We sent our staff out to speak with the patient's family and  take pictures.  The patient had been enrolled in the TRITON study.  Heparin  and Integrilin were given according to protocol.  A 6  French sheath was  upgraded to a 7 [place] sheath.  A JR4 guiding catheter with side holes was  then utilized to intubate the right coronary artery.  There was some very  mild ostial disease with a slight damping of the catheter with entry.  The  right coronary artery was then crossed using a Hi-Torque Floppy wire.  Predilatation was done with the 2.25 mm 15 mm Maverick balloon.  Following  this, we carefully gauged size and placed a 2.75 x 32 TAXUS drug-eluting  stent.  The stent choice was based upon length of lesion.  There was marked  improvement in the appearance of the artery, with the stent taken up to  about 12-13 atmospheres.  We then took a second wire down and passed it into  the acute marginal branch which was pinched.  Post dilatation was done with  a 3 mm power sail balloon.  This was taken out throughout the course of the  stent with marked improvement in the appearance of the artery.  The second  guide wire was across the side branch, and this was then dilated with a 2.25  mm balloon.  Initially there was about a 70% area of narrowing which became  90.  After predilatation, this was back to about 70.  Elected not to go  higher with the balloon for fear of compromising the stent to the main  portion of the right coronary artery.  There was excellent antegrade flow  throughout the entire vessel.  All wires were subsequently removed.  Generous doses of intracoronary nitroglycerin were given throughout the  course of the procedure.   HEMODYNAMIC DATA:  1. Central aortic pressure 171/79.  2. Left ventricle 155/12.  3. No gradient on pullback across the aortic valve.   ANGIOGRAPHIC DATA:  1. Ventriculography was performed in the RAO projection.  The ejection     fraction was calculated at 59%.  There was inferobasal hypokinesis.  This     was rather severe.  This was also noted in the LAO projection.  2. The left main coronary artery was free of critical disease.  3. The LAD  bifurcates fairly proximally into a large septal perforator and     an LAD proper.  The LAD proper has about 30% narrowing in some views.     The septal perforator probably has about 60% narrowing and is a fairly     large caliber vessel.  4. The circumflex proper demonstrates a slightly segmental area of disease     in the proximal vessel that has about 75% narrowing.  There is some mild     irregularity involving the ostium.  There is a marginal branch that has a     hazy 90% area of stenosis proximally.  The continuation portion of the     circumflex has about 30% narrowing.  5. The right coronary artery has about 30% narrowing at the ostium.  This is     followed by a 95% stenosis and then diffuse 70% narrowing overlying the     origin of the acute marginal.  Following the stenting of this vessel,     this was reduced to 0%.  This was done with a 2.75 x 32 TAXUS drug-     eluting stent with post dilatation to 30.  The acute marginal branch,     which is covered by the stent, as noted was about 70%, went to 90% and     then back down to about 70% after balloon dilatation.  There was     excellent runoff into the PDA.  Posterolateral exhibited a lot of spasm     throughout the course of the study.  There was a 40-50% area of narrowing     just beyond the bend in the posterolateral branch.   CONCLUSIONS:  1. Non-ST elevation myocardial infarction involving probably the right     coronary artery with successful stenting of the main channel and balloon     dilatation of the acute marginal branch to reopen this vessel.  2. Significant disease of the circumflex coronary artery.   DISPOSITION:  The patient has been started in the TRITON study.  He is  agreeable to this.  We will treat him medically at the present time.  We  will recommend that he come back to the catheterization laboratory on  Friday, and his name  has been placed on the schedule.  He will need an intervention of the  circumflex artery at that time.  I will be out of town,  will pass this on to my partners.                                               [name], M.D. [facility]    TDS/MEDQ  D:  07/15/2003  T:  07/16/2003  Job:  [id number]   cc:   Jeannett Senior A. Clent Ridges, M.D. [facility]   CV Laboratory

## 2010-07-01 NOTE — H&P (Signed)
Austin [name], Austin [name] NO.:  [id number]   MEDICAL RECORD NO.:  [id number]                   PATIENT TYPE:  INP   LOCATION:  1830                                 FACILITY:  [facility]   PHYSICIAN:  Austin [name], M.D.                   DATE OF BIRTH:  12/17/1940   DATE OF ADMISSION:  07/15/2003  DATE OF DISCHARGE:                                HISTORY & PHYSICAL   CHIEF COMPLAINT:  Chest pain.   HISTORY:  Austin [name] is a 70 year old white male with history of  dyslipidemia and tobacco use who presents with a one month history of  crescendo angina. The patient thought this was GI etiology. It seemed to be  similar to the discomfort he had during a colonoscopy. Associated initially  with exertion but eventually started occurring at rest. Last night, it awoke  him from sleep, and this morning, he had associated shortness of breath that  was concerning, and with family pressure, he finally presented to the  emergency room. He denied any nausea, vomiting, or diaphoresis. No syncope  or presyncope. He has had discomfort on an almost daily basis. No fevers or  chills or other constitutional symptoms.   REVIEW OF SYSTEMS:  Otherwise noncontributory.   PAST MEDICAL HISTORY:  Notable for gastroesophageal reflux disease and  dyslipidemia. The patient reports having had chest discomfort a number of  years ago and underwent stress test by Austin [name] which was negative.   ALLERGIES:  None.   MEDICATIONS:  1. Aspirin p.r.n.  2. Zocor 20 mg q.h.s.   SOCIAL HISTORY:  The patient lives in [place] with his wife. He has two  children. He has a long history of tobacco use, 1 pack per day for 40 years.  He uses alcohol rarely. Denies recreational drug use. He is retired Ec[employer].   FAMILY HISTORY:  Mother died of ovarian cancer in her 51s. Father died at  the age of 45 with emphysema. One brother is 4 with coronary artery disease  and has had infarction.   PHYSICAL EXAMINATION:  GENERAL:  On examination, he is well-developed, well-  nourished, white male in no acute distress.  VITAL SIGNS:  Temperature is 98.1, pulse 64, respirations 25, blood pressure  123/82. O2 saturation 96% on room air.  HEENT:  Pupils are equal, round, and reactive to light. Extraocular  movements are intact. Oropharynx with no lesion.  NECK:  Supple. No lymphadenopathy. No bruits.  HEART:  Regular rate without murmurs.  LUNGS:  Clear to auscultation.  ABDOMEN:  Soft, nontender.  EXTREMITIES:  Bilateral femoral bruits are audible. There is no peripheral  edema. Peripheral pulses are diminished bilaterally.  NEUROLOGICAL:  Motor exam is 5/5. Sensory is intact to touch.   STUDIES:  Chest x-ray shows no acute infiltration or effusion. ECG is normal  sinus rhythm at 57. There is T-wave  inversion in the lateral leads without  ST segment changes. Laboratory data is notable for a troponin 1.77.   ASSESSMENT/PLAN:  [name] is a 70 year old gentleman with multiple  cardiovascular risk factors including tobacco use, dyslipidemia, and family  history who presents with one month history of crescendo angina that has now  become unstable. He has had associated shortness of breath and continues to  have very mild chest discomfort. He has positive cardiac enzymes, and ECG  changes suggestive of non-ST elevation myocardial infarction. We will plan  on admitting the patient to telemetry monitor. We will anticoagulate with  heparin and start persistent antiplatelet therapy. We need to control  hemodynamics with beta blocker and ACE inhibitor as indicated. He will need  smoking cessation. We plan to advance his statin therapy after checking  lipid profile. The patient will undergo urgent cardiac catheterization to  define his anatomy and guide further therapy. The risks, benefits, and  alternatives of which were discussed with the patient who understands and  wishes to  proceed.                                                Austin [name], M.D.    Melton Alar  D:  07/15/2003  T:  07/15/2003  Job:  [id number]

## 2010-07-01 NOTE — Assessment & Plan Note (Signed)
[facility]                              CARDIOLOGY OFFICE NOTE   ZEREK, LITSEY                       MRN:          [id number]  DATE:11/15/2005                            DOB:          May 23, 1940    Austin [name] is in for followup.  He is doing reasonably well.  He developed  recently a cold and probable pneumonia.  He has been on amoxicillin.  He is  doing better, however.  Importantly, however, the patient has had no chest  pain.  He is now 2-1/2 years out after having drug-eluting stents in the  circumflex, marginal, and right coronary arteries.   MEDICATIONS:  1. Lipitor 40 mg daily.  2. Prilosec over the counter.  3. Altace 5 mg daily.  4. Toprol-XL 50 daily.  5. Zetia 10 mg at bedtime.  6. Aspirin 81 mg daily.  7. Plavix 75 mg daily.  8. Amoxicillin.  9. Zyrtec.   PHYSICAL EXAMINATION:  GENERAL:  He is a thin white male in no acute  distress.  VITAL SIGNS:  Weight 149, blood pressure 110/70 by me, pulse 59,  LUNGS:  Lung fields are clear, with decreased breath sounds.  CARDIAC:  Rhythm is regular.   The EKG reveals normal sinus rhythm, otherwise unremarkable.   Overall, the patient appears to be doing well.  I plan to see him back in  followup in about 6 months.  He does need a lipid and liver profile, and we  have carefully reviewed all of the indications for his current medications.  Given his stents in at least two locations, we will continue him on dual  antiplatelet therapy, and we will see him back in followup in 6 months.       [name], MD, [facility]     TDS/MedQ  DD:  11/15/2005  DT:  11/17/2005  Job #:  [id number]

## 2010-07-01 NOTE — Discharge Summary (Signed)
Austin [name], SMELSER NO.:  [id number]   MEDICAL RECORD NO.:  [id number]                   PATIENT TYPE:  INP   LOCATION:  2908                                 FACILITY:  [facility]   PHYSICIAN:  Veneda Melter, M.D.                   DATE OF BIRTH:  08/13/1940   DATE OF ADMISSION:  07/15/2003  DATE OF DISCHARGE:  07/18/2003                           DISCHARGE SUMMARY - REFERRING   PROCEDURES:  1. Emergent stenting of right coronary artery on July 15, 2003.  2. Elective stenting of circumflex and first obtuse marginal on July 17, 2003.   REASON FOR ADMISSION:  Please refer to dictated admission note.   LABORATORY DATA:  Hemoglobin 11.7 and hematocrit 34 at discharge.  Normal  electrolytes and renal function at discharge.  Cardiac markers:  Peak CPK  840/56; peak troponin I 29.4.  Lipid profile:  Total cholesterol 206,  triglycerides 126, HDL 34, LDL 147 (cholesterol/HDL ratio 6.1).  Hemoglobin  A1C 5.5.  Liver enzymes normal.   Admission CXR:  No acute changes.   HOSPITAL COURSE:  Following presentation to the emergency room with acute  coronary syndrome, the patient was stabilized and taken to the  catheterization laboratory where he underwent emergent intervention by  Austin [name]. Austin [name], M.D. (see report for full details).  The patient was  found to have significant two-vessel coronary artery disease and underwent  initial stenting (TAXUS) of tandem 70-90% proximal lesion of the proximal  RCA with no noted complications.  The patient was enrolled in the TRITON  study.   The patient remained stable and returned to the laboratory 48 hours later  for staged intervention of the proximal circumflex and first obtuse marginal  by Salvadore Farber, M.D.  The patient had placement of a TAXUS stent for  treatment of the circumflex and a CYPHER stent for treatment of the obtuse  marginal.   The patient's medications were adjusted with up titration of beta  blocker to  Toprol 100 mg daily by the time of discharge.  Low-dose Altace was also  started.   The patient was cleared for discharge on hospital day #3 in hemodynamically  stable condition.  There was minimal ecchymosis of the right groin, as well  as a small hematoma noted in the left groin.   MEDICATIONS AT DISCHARGE:  1. TRITON study drug.  2. Coated aspirin 325 mg daily.  3. Toprol XL 100 mg daily.  4. Altace 2.5 mg daily.  5. Lipitor 40 mg daily.  6. __________ 0.4 mg p.r.n.   ACTIVITY:  No strenuous activity or return to work until seen by physician.  No driving x 1 week.   FOLLOWUP:  The patient will be contacted by the TRITON study research team  regarding scheduled followup.   The patient will follow up with Austin [name]. Austin [name], M.D., in approximately  two weeks.  Arrangements will be made through the office.   DISCHARGE DIAGNOSES:  1. Acute non-ST elevation inferior myocardial infarction.     a. Emergent stenting (TAXUS) of proximal right coronary artery on July 15, 2003; staged stenting (TAXUS and CYPHER) of circumflex and first        obtuse marginal on July 17, 2003.     b. Preserved left ventricular function.     c. Triton study.  2. Tobacco.  3. Dyslipidemia.      Gene Serpe, P.A. [facility]                      Veneda Melter, M.D.    GS/MEDQ  D:  07/18/2003  T:  07/20/2003  Job:  [id number]   cc:   Jeannett Senior A. Clent Ridges, M.D. [facility]

## 2010-07-01 NOTE — Cardiovascular Report (Signed)
Austin [name], Austin [name] NO.:  [id number]   MEDICAL RECORD NO.:  [id number]                   PATIENT TYPE:  INP   LOCATION:  2908                                 FACILITY:  [facility]   PHYSICIAN:  Salvadore Farber, M.D. [facility]         DATE OF BIRTH:  1940-11-02   DATE OF PROCEDURE:  07/17/2003  DATE OF DISCHARGE:                              CARDIAC CATHETERIZATION   PROCEDURE:  Drug-eluting stent placement in the proximal circumflex and  first obtuse marginal.   INDICATION:  Austin [name] is a 70 year old gentleman who presented on June 1  with acute myocardial infarction.  Culprit lesion was at the RCA which was  treated by Dr. Riley Kill with drug-eluting stent placement.  He has had an  uncomplicated course thereafter.  He now returns to the lab for planned  revascularization of two severe stenoses, one in the proximal circumflex and  one in the proximal portion of the obtuse marginal.   PROCEDURAL TECHNIQUE:  Informed consent was obtained.  Under 1% lidocaine  local anesthesia, a 6 French sheath was placed in the left common femoral  artery using the modified Seldinger technique.  A 6 [place] JR-4 guide was  advanced over wire and engaged in the ostium of the left main.  Anticoagulation was initiated with heparin and double bolus eptifibatide to  achieve and maintain an ACT of greater than 200 seconds.  A Luge wire was  advanced to the distal portion of the first OM without difficulty.  The  lesion of the OM was directly stented using a 2.5 x 8-mm Cypher at 12  atmospheres.  Immediately after stent placement there was severe spasm of  the downstream vessel.  This was initially unresponsive to nitroglycerin.  The stent was post dilated using a 2.5 x 8-mm Quantum at 16 atmospheres.  Wire was then partially withdrawn from the obtuse marginal.  With this, the  spasm relieved and it was seen to have no residual stenosis and no  dissection.   Attention was then  turned to the lesion of the proximal circumflex.  It was  directly stented using a 3.0 x 18-mm Taxus at 18 atmospheres.  The stent was  then post dilated using a 3.25 x 15-mm PowerSail at 16 atmospheres.  After  post dilation, there was some apparent plaque shift into the proximal  circumflex.  There does not appear to be any severe stenosis, but  visualization was difficult due to overlap of the LAD.  I therefore  proceeded to intravascular ultrasound.  After the administration of  intracoronary nitroglycerin, IVUS catheter was positioned in the mid portion  of the circumflex.  Automated pullback was performed.  This demonstrated the  stent to be fully expanded and well apposed throughout its course.  The  stented segment had an area of 7.5 sq m.  Proximal to the stent the most  severe stenosis was just at the circumflex  ostium.  This had a residual  lumen area of 4.9 sq cm for approximately 35% area of stenosis.  There was  no evidence of dissection and TIMI-3 flow to the distal vasculature.  IVUS  catheter and wire were then removed.  Repeat angiography demonstrated no  residual stenosis and TIMI-3 flow to the distal vasculature.  The patient  tolerated the procedure well and transferred to the holding room in stable  condition.  Sheaths will be removed when the ACT is less than 150 seconds.   IMPRESSION/RECOMMENDATIONS:  Successful drug-eluting stent placement in both  the first obtuse marginal and the proximal circumflex.  He will be continued  on eptifibatide for 18 hours.  Tritan study will be continued.                                               Salvadore Farber, M.D. [facility]    WED/MEDQ  D:  07/17/2003  T:  07/17/2003  Job:  [id number]   cc:   Jeannett Senior A. Clent Ridges, M.D. [facility]   Maisie Fus D. Riley Kill, M.D. [facility]

## 2010-09-01 ENCOUNTER — Ambulatory Visit: Payer: Medicare Other | Admitting: Cardiology

## 2010-09-05 ENCOUNTER — Encounter: Payer: Self-pay | Admitting: Cardiology

## 2010-09-07 ENCOUNTER — Encounter: Payer: Self-pay | Admitting: Cardiology

## 2010-09-07 ENCOUNTER — Ambulatory Visit (INDEPENDENT_AMBULATORY_CARE_PROVIDER_SITE_OTHER): Payer: Medicare Other | Admitting: Cardiology

## 2010-09-07 VITALS — BP 120/68 | HR 64 | Ht 71.0 in | Wt 141.0 lb

## 2010-09-07 DIAGNOSIS — E785 Hyperlipidemia, unspecified: Secondary | ICD-10-CM

## 2010-09-07 DIAGNOSIS — I2510 Atherosclerotic heart disease of native coronary artery without angina pectoris: Secondary | ICD-10-CM

## 2010-09-07 DIAGNOSIS — I10 Essential (primary) hypertension: Secondary | ICD-10-CM

## 2010-09-07 NOTE — Progress Notes (Signed)
HPI:  Doing well.  No specific complaints.  Denies any chest pain.  Overall stable.   Current Outpatient Prescriptions  Medication Sig Dispense Refill  . aspirin 81 MG tablet Take 81 mg by mouth daily.        Marland Kitchen atorvastatin (LIPITOR) 40 MG tablet Take 40 mg by mouth at bedtime.        . metoprolol (TOPROL-XL) 50 MG 24 hr tablet Take 50 mg by mouth daily.        . nitroGLYCERIN (NITROSTAT) 0.4 MG SL tablet Place 0.4 mg under the tongue as directed.        . ramipril (ALTACE) 2.5 MG capsule Take 2.5 mg by mouth daily.          No Known Allergies  Past Medical History  Diagnosis Date  . CAD (coronary artery disease)   . Unspecified essential hypertension   . Dyspnea on exertion   . Hyperlipidemia   . GERD (gastroesophageal reflux disease)   . Tobacco abuse     Past Surgical History  Procedure Date  . Ptca     And drug-eluting stent to the RCA  . Coronary angioplasty with stent placement     Drug eluting stent placed to the first obtuse marginal with a 2.58 mm cypher drug eluting stent. Circumflex was treated with 3.0 x 18 mm TAXUS drug-eluting stent on 07/17/03    Family History  Problem Relation Age of Onset  . Cancer Mother   . Lung disease Father     Black lung    History   Social History  . Marital Status: Married    Spouse Name: N/A    Number of Children: N/A  . Years of Education: N/A   Occupational History  . 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 Smoking status: Former Games developer  . Smokeless tobacco: Not on file   Comment: Smoked for about 40 years  . Alcohol Use: No  . Drug Use: No  . Sexually Active: Not on file   Other Topics Concern  . Not on file   Social History Narrative   Lives in [place] with his wifeVietnam veteranExercises 3 times a week at the [facility]    ROS: Please see the HPI.  All other systems reviewed and negative.  PHYSICAL EXAM:  BP 120/68  Pulse 64  Ht 5\' 11"  (1.803 m)  Wt  141 lb (63.957 kg)  BMI 19.67 kg/m2  General: Well developed, well nourished, in no acute distress. Head:  Normocephalic and atraumatic. Neck: no JVD Lungs: Clear to auscultation and percussion. Heart: Normal S1 and S2.  No murmur, rubs or gallops.  Abdomen:  Normal bowel sounds; soft; non tender; no organomegaly Pulses: Pulses normal in all 4 extremities. Extremities: No clubbing or cyanosis. No edema. Neurologic: Alert and oriented x 3.  EKG:  NSR.  WNL.    ASSESSMENT AND PLAN:

## 2010-09-07 NOTE — Patient Instructions (Signed)
Your physician recommends that you schedule a follow-up appointment in: 1 year with Dr. Riley Kill  Please send our office a copy of your labs from the [place]  Our fax number is [phone]

## 2010-09-12 ENCOUNTER — Telehealth: Payer: Self-pay | Admitting: Cardiology

## 2010-09-12 MED ORDER — ATORVASTATIN CALCIUM 40 MG PO TABS: 40.0000 mg | ORAL_TABLET | Freq: Every day | ORAL | Status: DC

## 2010-09-12 MED ORDER — RAMIPRIL 2.5 MG PO CAPS: 2.5000 mg | ORAL_CAPSULE | Freq: Every day | ORAL | Status: DC

## 2010-09-12 MED ORDER — METOPROLOL SUCCINATE ER 50 MG PO TB24: 50.0000 mg | ORAL_TABLET | Freq: Every day | ORAL | Status: DC

## 2010-09-12 NOTE — Telephone Encounter (Signed)
Per pt call pt needs RX refill of lipitor 40 mg, ramipril 2.5 mg, metoprolol 50 mg. Called into [facility] [phone] [email or web address]

## 2010-09-12 NOTE — Telephone Encounter (Signed)
[facility] because he said he was out of his meds.

## 2010-09-25 NOTE — Assessment & Plan Note (Signed)
No symptoms at the present time.  Continues to do well.  Takes only ASA, and has not had any problems.  Now out seven years from index event, and no current symptoms.

## 2010-09-25 NOTE — Assessment & Plan Note (Signed)
Currently controlled on medical therapy.  

## 2010-09-25 NOTE — Assessment & Plan Note (Signed)
Currently on atorva 40mg , near target at present.

## 2010-10-19 ENCOUNTER — Encounter (INDEPENDENT_AMBULATORY_CARE_PROVIDER_SITE_OTHER): Payer: Medicare Other | Admitting: Surgery

## 2010-10-25 ENCOUNTER — Encounter (INDEPENDENT_AMBULATORY_CARE_PROVIDER_SITE_OTHER): Payer: Self-pay | Admitting: General Surgery

## 2010-10-25 ENCOUNTER — Ambulatory Visit (INDEPENDENT_AMBULATORY_CARE_PROVIDER_SITE_OTHER): Payer: Medicare Other | Admitting: General Surgery

## 2010-10-25 VITALS — BP 118/76 | HR 60 | Temp 97.8°F | Ht 71.0 in | Wt 145.6 lb

## 2010-10-25 DIAGNOSIS — K625 Hemorrhage of anus and rectum: Secondary | ICD-10-CM

## 2010-10-25 DIAGNOSIS — K648 Other hemorrhoids: Secondary | ICD-10-CM

## 2010-10-25 NOTE — Progress Notes (Signed)
Subjective:     Patient ID: Austin [name], male   DOB: 1940-10-12, 70 y.o.   MRN: [id number]  HPIPatient is a 70 year old Caucasian male patient of Austin [name] and had an internal and external hemorrhoidectomy in 2008 for Austin [name] incision Austin [name] dates the patient was really have a little prolapse of rectal mucosa not truly significant internal and external hemorrhoids but his operative note shows that he did 3 quadrant internal and external hemorrhoidectomy and the patient had very little pain postop  and saw Austin [name] on one occasion postoperative. He has a history of coronary artery disease and takes nitroglycerin with him but has not required the need for nitroglycerin and last saw Austin [name] several months ago. The patient gets his other medical care at the [place] in [place].  The patient comes in today to be seen because of intermittent small that of rectal bleeding that usually occurs he wipes after a bowel movement. He says his never been enough blood to cause any real problems with his wife is concerned that he might have a colon cancer and encouraged him to return to see Austin [name]. On reviewing Austin [name] as guides it appears that he didn't really have significant hemorrhoids with prolapse of the anal mucosa and on examination today I am seeing in a similar problem his stool in the rectal vault is soft Hemoccult negative and there is no evidence of any bleeding on a careful anoscopic exam at this time.   Review of Systems Past Medical History  Diagnosis Date  . CAD (coronary artery disease)   . Unspecified essential hypertension   . Dyspnea on exertion   . Hyperlipidemia   . GERD (gastroesophageal reflux disease)   . Tobacco abuse   . Rectal bleeding    Past Surgical History  Procedure Date  . Ptca     And drug-eluting stent to the RCA  . Coronary angioplasty with stent placement     Drug eluting stent placed to the first obtuse marginal with a 2.58 mm cypher drug eluting  stent. Circumflex was treated with 3.0 x 18 mm TAXUS drug-eluting stent on 07/17/03  . Eye surgery 1971    bilateral  . Rectal surgery 2008    Austin [name] did this surgery   Current Outpatient Prescriptions  Medication Sig Dispense Refill  . aspirin 81 MG tablet Take 81 mg by mouth daily.        Marland Kitchen atorvastatin (LIPITOR) 40 MG tablet Take 1 tablet (40 mg total) by mouth at bedtime.  90 tablet  2  . metoprolol (TOPROL-XL) 50 MG 24 hr tablet Take 1 tablet (50 mg total) by mouth daily.  30 tablet  2  . nitroGLYCERIN (NITROSTAT) 0.4 MG SL tablet Place 0.4 mg under the tongue as directed.        . ramipril (ALTACE) 2.5 MG capsule Take 1 capsule (2.5 mg total) by mouth daily.  30 capsule  2          Objective:   Physical ExamBP 118/76  Pulse 60  Temp(Src) 97.8 F (36.6 C) (Temporal)  Ht 5\' 11"  (1.803 m)  Wt 145 lb 9.6 oz (66.044 kg)  BMI 20.31 kg/m2  Physical examination the patient's in no acute distress and on examination he coughs a little bit and when he does cough he gets a little bit of prolapse of the anal mucosa but not a true rectal prolapse. The patient is still smoking but says he's trying  to decrease. He is on aspirin and does not take Coumadin or Plavix lungs are clear cardiac negative he's recently seen Dr. Tedra [name] and says he doesn't need to see Dr. Tedra [name] again for one year. [facility] the [facility] I can find is one 2 years ago performed by Dr. Tedra [name] that showed a hematocrit of 35 and I will have a repeat CBC ordered and they can draw it when they see Dr. Gwendolyn [name] nurse later today.  Please refer to the note above regarding the findings of careful rectal exam no stricture spasm, loose sinus straining or coughing there is a little bit of anal mucosa that prolapses it spontaneously reduces there is no evidence of any bleeding on my examination today    Assessment:    Very minimal rectal prolapse anall mucosa with straining or coughing but not truly prolapsing  hemorrhoids or bleeding hemorrhoids and has been 5 years since his last colonoscopy and I think that he needs repeat colonoscopy to make sure there is not any rectal or colon problems are expected little bit of bleeding that he is noted he is coming from the wiping and a little prolapse of anal mucosa     Plan:    To see Dr. Sharol Roussel see nurse's this afternoon for a CBC and also to get scheduled for colonoscopy by Austin [name] guide sometime in the future

## 2011-02-09 ENCOUNTER — Encounter (INDEPENDENT_AMBULATORY_CARE_PROVIDER_SITE_OTHER): Payer: Self-pay | Admitting: Gastroenterology

## 2011-02-23 ENCOUNTER — Encounter (INDEPENDENT_AMBULATORY_CARE_PROVIDER_SITE_OTHER): Payer: Self-pay | Admitting: Gastroenterology

## 2011-06-01 ENCOUNTER — Other Ambulatory Visit: Payer: Self-pay

## 2011-06-26 ENCOUNTER — Encounter: Payer: Self-pay | Admitting: Cardiology

## 2011-07-17 ENCOUNTER — Telehealth: Payer: Self-pay | Admitting: Cardiology

## 2011-07-17 MED ORDER — METOPROLOL SUCCINATE ER 50 MG PO TB24: 50.0000 mg | ORAL_TABLET | Freq: Every day | ORAL | Status: DC

## 2011-07-17 MED ORDER — RAMIPRIL 2.5 MG PO CAPS: 2.5000 mg | ORAL_CAPSULE | Freq: Every day | ORAL | Status: DC

## 2011-07-17 NOTE — Telephone Encounter (Signed)
New Problem:    Patient called in needing a 90 day refill of his ramipril (ALTACE) 2.5 MG capsule and metoprolol (TOPROL-XL) 50 MG 24 hr tablet filled with [facility].  Please call back if you have any questions.

## 2011-08-28 ENCOUNTER — Ambulatory Visit: Payer: Medicare Other | Admitting: Cardiology

## 2011-08-30 ENCOUNTER — Encounter: Payer: Self-pay | Admitting: Cardiology

## 2011-09-04 ENCOUNTER — Ambulatory Visit (INDEPENDENT_AMBULATORY_CARE_PROVIDER_SITE_OTHER): Payer: Medicare Other | Admitting: Cardiology

## 2011-09-04 ENCOUNTER — Encounter: Payer: Self-pay | Admitting: Cardiology

## 2011-09-04 VITALS — BP 122/58 | HR 68 | Ht 71.0 in | Wt 147.0 lb

## 2011-09-04 DIAGNOSIS — E785 Hyperlipidemia, unspecified: Secondary | ICD-10-CM

## 2011-09-04 DIAGNOSIS — F17200 Nicotine dependence, unspecified, uncomplicated: Secondary | ICD-10-CM

## 2011-09-04 DIAGNOSIS — I2510 Atherosclerotic heart disease of native coronary artery without angina pectoris: Secondary | ICD-10-CM

## 2011-09-04 MED ORDER — ATORVASTATIN CALCIUM 40 MG PO TABS: 40.0000 mg | ORAL_TABLET | Freq: Every day | ORAL | Status: DC

## 2011-09-04 MED ORDER — NITROGLYCERIN 0.4 MG SL SUBL: 0.4000 mg | SUBLINGUAL_TABLET | SUBLINGUAL | Status: DC

## 2011-09-04 MED ORDER — METOPROLOL SUCCINATE ER 50 MG PO TB24: 50.0000 mg | ORAL_TABLET | Freq: Every day | ORAL | Status: DC

## 2011-09-04 MED ORDER — RAMIPRIL 2.5 MG PO CAPS
2.5000 mg | ORAL_CAPSULE | Freq: Every day | ORAL | Status: DC
Start: 2011-09-04 — End: 2012-04-23

## 2011-09-04 NOTE — Progress Notes (Signed)
HPI:  Patient overall remained stable. He and I had a long discussion today about his posttraumatic stress disorder. He says that when he has problems with this he'll smoke, but in general he says is now only smoking about 10 cigarettes per year. Patient is followed at the [facility] in [place]. He denies any ongoing chest pain. He is tolerating his medications quite well.  Current Outpatient Prescriptions  Medication Sig Dispense Refill  . aspirin 81 MG tablet Take 81 mg by mouth daily.        Marland Kitchen atorvastatin (LIPITOR) 40 MG tablet Take 1 tablet (40 mg total) by mouth at bedtime.  90 tablet  2  . metoprolol succinate (TOPROL-XL) 50 MG 24 hr tablet Take 1 tablet (50 mg total) by mouth daily.  90 tablet  1  . nitroGLYCERIN (NITROSTAT) 0.4 MG SL tablet Place 0.4 mg under the tongue as directed.        . ramipril (ALTACE) 2.5 MG capsule Take 1 capsule (2.5 mg total) by mouth daily.  90 capsule  1    No Known Allergies  Past Medical History  Diagnosis Date  . CAD (coronary artery disease)   . Unspecified essential hypertension   . Dyspnea on exertion   . Hyperlipidemia   . GERD (gastroesophageal reflux disease)   . Tobacco abuse   . Rectal bleeding     Past Surgical History  Procedure Date  . Ptca     And drug-eluting stent to the RCA  . Coronary angioplasty with stent placement     Drug eluting stent placed to the first obtuse marginal with a 2.58 mm cypher drug eluting stent. Circumflex was treated with 3.0 x 18 mm TAXUS drug-eluting stent on 07/17/03  . Eye surgery 1971    bilateral  . Rectal surgery 2008    [name] did this surgery    Family History  Problem Relation Age of Onset  . Cancer Mother     cervical  . Lung disease Father     Black lung    History   Social History  . Marital Status: Married    Spouse Name: N/A    Number of Children: N/A  . Years of Education: N/A   Occupational History  . Retired Naval architect   . Runs a cardiac rehab     Social History Main Topics  . Smoking status: Current Some Day Smoker -- 1.0 packs/day  . Smokeless tobacco: Not on file   Comment: Smoked for about 40 years, smokes cigarette every once in a while, maybe a pack a year.  . Alcohol Use: Yes  . Drug Use: No  . Sexually Active: Not on file   Other Topics Concern  . Not on file   Social History Narrative   Lives in [place] with his wifeViet[place] veteranExercises 3 times a week at the [facility]    ROS: Please see the HPI.  All other systems reviewed and negative.  PHYSICAL EXAM:  BP 122/58  Pulse 68  Ht 5\' 11"  (1.803 m)  Wt 147 lb (66.679 kg)  BMI 20.50 kg/m2  General: Well developed, well nourished, in no acute distress. Head:  Normocephalic and atraumatic. Neck: no JVD Lungs: Clear to auscultation and percussion. Heart: Normal S1 and S2.  No murmur, rubs or gallops.  Abdomen:  Normal bowel sounds; soft; non tender; no organomegaly Pulses: Pulses normal in all 4 extremities. Extremities: No clubbing or cyanosis. No edema. Neurologic: Alert and oriented  x 3.  EKG:  NSR.  Nonspecific inferior T flattening  ASSESSMENT AND PLAN:

## 2011-09-04 NOTE — Patient Instructions (Addendum)
Your physician recommends that you return for a FASTING LIPID, LIVER and BMP--nothing to eat or drink after midnight, lab opens at 8:30   Your physician wants you to follow-up in: February/March 2014.  You will receive a reminder letter in the mail two months in advance. If you don't receive a letter, please call our office to schedule the follow-up appointment.  Your physician recommends that you continue on your current medications as directed. Please refer to the Current Medication list given to you today.

## 2011-09-10 NOTE — Assessment & Plan Note (Signed)
He says he is only smoking 10 cigarettes per year.

## 2011-09-10 NOTE — Assessment & Plan Note (Signed)
We will obtain a lipid and liver profile today.

## 2011-09-10 NOTE — Assessment & Plan Note (Signed)
The patient has a Taxus stent in the right coronary artery, and both a Cypher and Taxus stent in the circumflex coronary artery. He's on single antiplatelet therapy at this point in time, and his continued to do well. We will encourage him to continue to take care of himself.

## 2011-09-11 ENCOUNTER — Other Ambulatory Visit (INDEPENDENT_AMBULATORY_CARE_PROVIDER_SITE_OTHER): Payer: Medicare Other

## 2011-09-11 DIAGNOSIS — I2510 Atherosclerotic heart disease of native coronary artery without angina pectoris: Secondary | ICD-10-CM

## 2011-09-11 LAB — LIPID PANEL
Cholesterol: 130 mg/dL (ref 0–200)
VLDL: 12 mg/dL (ref 0.0–40.0)

## 2011-09-11 LAB — HEPATIC FUNCTION PANEL
AST: 22 U/L (ref 0–37)
Albumin: 4.2 g/dL (ref 3.5–5.2)
Total Protein: 7.1 g/dL (ref 6.0–8.3)

## 2011-09-11 LAB — BASIC METABOLIC PANEL
BUN: 25 mg/dL — ABNORMAL HIGH (ref 6–23)
CO2: 25 mEq/L (ref 19–32)
Calcium: 9.2 mg/dL (ref 8.4–10.5)
GFR: 67.51 mL/min (ref >60.00)
Glucose, Bld: 100 mg/dL — ABNORMAL HIGH (ref 70–99)
Potassium: 4 mEq/L (ref 3.5–5.1)

## 2011-09-12 ENCOUNTER — Other Ambulatory Visit: Payer: Medicare Other

## 2012-02-20 ENCOUNTER — Encounter: Payer: Self-pay | Admitting: Cardiology

## 2012-02-20 ENCOUNTER — Ambulatory Visit (INDEPENDENT_AMBULATORY_CARE_PROVIDER_SITE_OTHER): Payer: Medicare Other | Admitting: Cardiology

## 2012-02-20 VITALS — BP 130/70 | HR 65 | Ht 70.0 in | Wt 148.0 lb

## 2012-02-20 DIAGNOSIS — E785 Hyperlipidemia, unspecified: Secondary | ICD-10-CM

## 2012-02-20 DIAGNOSIS — I2510 Atherosclerotic heart disease of native coronary artery without angina pectoris: Secondary | ICD-10-CM

## 2012-02-20 DIAGNOSIS — I10 Essential (primary) hypertension: Secondary | ICD-10-CM

## 2012-02-20 DIAGNOSIS — F17200 Nicotine dependence, unspecified, uncomplicated: Secondary | ICD-10-CM

## 2012-02-20 NOTE — Assessment & Plan Note (Signed)
Controlled at present.  

## 2012-02-20 NOTE — Assessment & Plan Note (Addendum)
Near target at last office visit. Recheck at six month office visit.

## 2012-02-20 NOTE — Progress Notes (Signed)
   HPI:  Patient seen in followup. He really is doing quite well. He is no specific complaints the present time. He denies any ongoing chest pain or significant shortness of breath.  Current Outpatient Prescriptions  Medication Sig Dispense Refill  . aspirin 81 MG tablet Take 81 mg by mouth daily.        Marland Kitchen atorvastatin (LIPITOR) 40 MG tablet Take 1 tablet (40 mg total) by mouth at bedtime.  90 tablet  3  . metoprolol succinate (TOPROL-XL) 50 MG 24 hr tablet Take 1 tablet (50 mg total) by mouth daily.  90 tablet  3  . nitroGLYCERIN (NITROSTAT) 0.4 MG SL tablet Place 1 tablet (0.4 mg total) under the tongue as directed.  25 tablet  2  . ramipril (ALTACE) 2.5 MG capsule Take 1 capsule (2.5 mg total) by mouth daily.  90 capsule  3    No Known Allergies  Past Medical History  Diagnosis Date  . CAD (coronary artery disease)   . Unspecified essential hypertension   . Dyspnea on exertion   . Hyperlipidemia   . GERD (gastroesophageal reflux disease)   . Tobacco abuse   . Rectal bleeding     Past Surgical History  Procedure Date  . Ptca     And drug-eluting stent to the RCA  . Coronary angioplasty with stent placement     Drug eluting stent placed to the first obtuse marginal with a 2.58 mm cypher drug eluting stent. Circumflex was treated with 3.0 x 18 mm TAXUS drug-eluting stent on 07/17/03  . Eye surgery 1971    bilateral  . Rectal surgery 2008    [name] did this surgery    Family History  Problem Relation Age of Onset  . Cancer Mother     cervical  . Lung disease Father     Black lung    History   Social History  . Marital Status: Married    Spouse Name: N/A    Number of Children: N/A  . Years of Education: N/A   Occupational History  . 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 Smoking status: Former Smoker -- 1.0 packs/day  . Smokeless tobacco: Not on file     Comment: Smoked for about 40 years, smokes cigarette every once  in a while, maybe a pack a year.  . Alcohol Use: Yes  . Drug Use: No  . Sexually Active: Not on file   Other Topics Concern  . Not on file   Social History Narrative   [place] in [place] with his wifeViet[place] veteranExercises 3 times a week at the [facility]    ROS: Please see the HPI.  All other systems reviewed and negative.  PHYSICAL EXAM:  BP 130/70  Pulse 65  Ht 5\' 10"  (1.778 m)  Wt 148 lb (67.132 kg)  BMI 21.24 kg/m2  SpO2 98%  General: Well developed, well nourished, in no acute distress. Head:  Normocephalic and atraumatic. Neck: no JVD.  No carotid bruits.   Lungs: Clear to auscultation and percussion. Heart: Normal S1 and S2.  No murmur, rubs or gallops.  Pulses: Pulses normal in all 4 extremities. Extremities: No clubbing or cyanosis. No edema. Neurologic: Alert and oriented x 3.  EKG:  NSR.  WNL.    ASSESSMENT AND PLAN:

## 2012-02-20 NOTE — Assessment & Plan Note (Signed)
Not really smoking.

## 2012-02-20 NOTE — Patient Instructions (Addendum)
Your physician recommends that you schedule a follow-up appointment in: 6 months with Dr. Clifton James.

## 2012-02-20 NOTE — Assessment & Plan Note (Addendum)
Patient remains stable on single antiplatelet therapy.  No new changes.  We will have him follow up with Dr. Clifton James. He should remain on medical therapy.

## 2012-04-22 ENCOUNTER — Telehealth: Payer: Self-pay | Admitting: Cardiology

## 2012-04-22 NOTE — Telephone Encounter (Signed)
[facility]     [facility]    ram[facility]  2.5 mg

## 2012-04-23 ENCOUNTER — Other Ambulatory Visit: Payer: Self-pay

## 2012-04-23 DIAGNOSIS — I2510 Atherosclerotic heart disease of native coronary artery without angina pectoris: Secondary | ICD-10-CM

## 2012-04-23 MED ORDER — RAMIPRIL 2.5 MG PO CAPS: 2.5000 mg | ORAL_CAPSULE | Freq: Every day | ORAL | Status: DC

## 2012-08-19 ENCOUNTER — Telehealth: Payer: Self-pay | Admitting: *Deleted

## 2012-08-19 NOTE — Telephone Encounter (Signed)
Pt has 6 month follow up appt with Dr. Clifton James scheduled for July 14,2014. Previous pt of Dr. Riley Kill.  I have placed call to pt to reschedule this appt. Left message to call back

## 2012-08-20 NOTE — Telephone Encounter (Signed)
appt has been changed to October 03, 2012

## 2012-08-21 ENCOUNTER — Telehealth: Payer: Self-pay

## 2012-08-21 DIAGNOSIS — I2510 Atherosclerotic heart disease of native coronary artery without angina pectoris: Secondary | ICD-10-CM

## 2012-08-21 MED ORDER — METOPROLOL SUCCINATE ER 50 MG PO TB24: 50.0000 mg | ORAL_TABLET | Freq: Every day | ORAL | Status: DC

## 2012-08-21 MED ORDER — RAMIPRIL 2.5 MG PO CAPS: 2.5000 mg | ORAL_CAPSULE | Freq: Every day | ORAL | Status: DC

## 2012-08-21 MED ORDER — ATORVASTATIN CALCIUM 40 MG PO TABS: 40.0000 mg | ORAL_TABLET | Freq: Every day | ORAL | Status: DC

## 2012-08-21 NOTE — Telephone Encounter (Signed)
Waiting on return phone call to see whats pt needs from our office?

## 2012-08-21 NOTE — Telephone Encounter (Signed)
Pt calling back to get refills. Fax Received. Refill Completed. Vanda Waskey Chowoe (R.M.A)

## 2012-08-26 ENCOUNTER — Ambulatory Visit: Payer: Medicare Other | Admitting: Cardiovascular Disease

## 2012-10-03 ENCOUNTER — Encounter: Payer: Self-pay | Admitting: Cardiovascular Disease

## 2012-10-03 ENCOUNTER — Ambulatory Visit (INDEPENDENT_AMBULATORY_CARE_PROVIDER_SITE_OTHER): Payer: Medicare Other | Admitting: Cardiovascular Disease

## 2012-10-03 VITALS — BP 140/78 | HR 62 | Ht 71.0 in | Wt 148.0 lb

## 2012-10-03 DIAGNOSIS — E785 Hyperlipidemia, unspecified: Secondary | ICD-10-CM

## 2012-10-03 DIAGNOSIS — I10 Essential (primary) hypertension: Secondary | ICD-10-CM

## 2012-10-03 DIAGNOSIS — Z720 Tobacco use: Secondary | ICD-10-CM

## 2012-10-03 DIAGNOSIS — R0609 Other forms of dyspnea: Secondary | ICD-10-CM

## 2012-10-03 DIAGNOSIS — I2510 Atherosclerotic heart disease of native coronary artery without angina pectoris: Secondary | ICD-10-CM

## 2012-10-03 DIAGNOSIS — R0600 Dyspnea, unspecified: Secondary | ICD-10-CM

## 2012-10-03 DIAGNOSIS — F17200 Nicotine dependence, unspecified, uncomplicated: Secondary | ICD-10-CM

## 2012-10-03 LAB — LIPID PANEL
LDL Cholesterol: 67 mg/dL (ref 0–99)
Total CHOL/HDL Ratio: 3
Triglycerides: 101 mg/dL (ref 0.0–149.0)
VLDL: 20.2 mg/dL (ref 0.0–40.0)

## 2012-10-03 LAB — HEPATIC FUNCTION PANEL
Albumin: 4.2 g/dL (ref 3.5–5.2)
Alkaline Phosphatase: 74 U/L (ref 39–117)
Total Protein: 7.1 g/dL (ref 6.0–8.3)

## 2012-10-03 NOTE — Patient Instructions (Addendum)
Your physician recommends that you have lab work today:  Lipids & lfts.  We will call you with your results  Your physician has requested that you have an echocardiogram. Echocardiography is a painless test that uses sound waves to create images of your heart. It provides your doctor with information about the size and shape of your heart and how well your heart's chambers and valves are working. This procedure takes approximately one hour. There are no restrictions for this procedure.  Your physician has requested that you have an exercise tolerance test. For further information please visit [email or web address] Please also follow instruction sheet, as given.  Your physician wants you to follow-up in: 6 months with Dr. Clifton James.  You will receive a reminder letter in the mail two months in advance. If you don't receive a letter, please call our office to schedule the follow-up appointment.  No medication changes were made today

## 2012-10-03 NOTE — Progress Notes (Signed)
History of Present Illness: 72 yo male with history of CAD, HTN, HLD who is here today for cardiac follow up. He has been followed in the past by Dr. Riley Kill. 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 A 2.75 x 32 Taxus DES was placed in the mid RCA. A 2.5 x 8 mm Cypher DES was placed in OM1. A 3.0 x 18 mm Taxus DES was placed in the proximal Circumflex. He has had no cardiac catheterization since 2005.   He is here today for follow up. He tells me that he has had no chest pain, SOB, palpitations, near syncope or syncope. He occasionally smokes.   [facility]: [facility])  Last Lipid Profile:Lipid Panel     Component Value Date/Time   CHOL 130 09/11/2011 0919   TRIG 60.0 09/11/2011 0919   HDL 40.10 09/11/2011 0919   CHOLHDL 3 09/11/2011 0919   VLDL 12.0 09/11/2011 0919   LDLCALC 78 09/11/2011 0919    Past Medical History  Diagnosis Date  . CAD (coronary artery disease)   . Unspecified essential hypertension   . Dyspnea on exertion   . Hyperlipidemia   . GERD (gastroesophageal reflux disease)   . Tobacco abuse   . Rectal bleeding     Past Surgical History  Procedure Laterality Date  . Ptca      And drug-eluting stent to the RCA  . Coronary angioplasty with stent placement      Drug eluting stent placed to the first obtuse marginal with a 2.58 mm cypher drug eluting stent. Circumflex was treated with 3.0 x 18 mm TAXUS drug-eluting stent on 07/17/03  . Eye surgery  1971    bilateral  . Rectal surgery  2008    [name] did this surgery    Current Outpatient Prescriptions  Medication Sig Dispense Refill  . aspirin 81 MG tablet Take 81 mg by mouth daily.        Marland Kitchen atorvastatin (LIPITOR) 40 MG tablet Take 1 tablet (40 mg total) by mouth at bedtime.  90 tablet  3  . metoprolol succinate (TOPROL-XL) 50 MG 24 hr tablet Take 1 tablet (50 mg total) by mouth daily.  90 tablet  3  . nitroGLYCERIN (NITROSTAT) 0.4 MG SL tablet Place 1 tablet (0.4 mg total) under the  tongue as directed.  25 tablet  2  . ramipril (ALTACE) 2.5 MG capsule Take 1 capsule (2.5 mg total) by mouth daily.  90 capsule  3   No current facility-administered medications for this visit.    No Known Allergies  History   Social History  . Marital Status: Married    Spouse Name: N/A    Number of Children: N/A  . Years of Education: N/A   Occupational History  . 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 Smoking status: Former Smoker -- 1.00 packs/day  . Smokeless tobacco: Not on file     Comment: Smoked for about 40 years, smokes cigarette every once in a while, maybe a pack a year.  . Alcohol Use: Yes  . Drug Use: No  . Sexual Activity: Not on file   Other Topics Concern  . Not on file   Social History Narrative   Lives in [place] with his wife   [place] veteran   Exercises 3 times a week at the [employer]    Family History  Problem Relation Age of  Onset  . Cancer Mother     cervical  . Lung disease Father     Black lung    Review of Systems:  As stated in the HPI and otherwise negative.   BP 140/78  Pulse 62  Ht 5\' 11"  (1.803 m)  Wt 148 lb (67.132 kg)  BMI 20.65 kg/m2  SpO2 96%  Physical Examination: General: Well developed, well nourished, NAD HEENT: OP clear, mucus membranes moist SKIN: warm, dry. No rashes. Neuro: No focal deficits Musculoskeletal: Muscle strength 5/5 all ext Psychiatric: Mood and affect normal Neck: No JVD, no carotid bruits, no thyromegaly, no lymphadenopathy. Lungs:Clear bilaterally, no wheezes, rhonci, crackles Cardiovascular: Regular rate and rhythm. No murmurs, gallops or rubs. Abdomen:Soft. Bowel sounds present. Non-tender.  Extremities: No lower extremity edema. Pulses are 2 + in the bilateral DP/PT.  Assessment and Plan:   1. CAD: Patient remains stable on single antiplatelet therapy.  No new changes.  Will arrange treadmill exercise stress test to exclude ischemia since  he has had no recent ischemic testing. Will also arrange echocardiogram to assess LVEF since has some dyspnea.   2. HYPERTENSION: Controlled. No changes.       3. TOBACCO ABUSE:  Complete cessation encouraged.   4. HYPERLIPIDEMIA: Lipids at goal. On a statin.  Will repeat lipids and LFTs today.   5. Dyspnea: As above. Will arrange stress test and echo

## 2012-10-29 ENCOUNTER — Ambulatory Visit (INDEPENDENT_AMBULATORY_CARE_PROVIDER_SITE_OTHER): Payer: Medicare Other | Admitting: Physician Assistant

## 2012-10-29 DIAGNOSIS — R0600 Dyspnea, unspecified: Secondary | ICD-10-CM

## 2012-10-29 DIAGNOSIS — I2510 Atherosclerotic heart disease of native coronary artery without angina pectoris: Secondary | ICD-10-CM

## 2012-10-29 NOTE — Progress Notes (Signed)
Exercise Treadmill Test  Pre-Exercise Testing Evaluation Rhythm: sinus bradycardia  Rate: 58     Test  Exercise Tolerance Test Ordering MD: Melene Muller, MD  Interpreting MD: Brynda Rim  Unique Test No: 1  Treadmill:  1  Indication for ETT: known ASHD  Contraindication to ETT: No   Stress Modality: exercise - treadmill  Cardiac Imaging Performed: non   Protocol: standard Bruce - maximal  Max BP:  192/82  Max MPHR (bpm):  149 85% MPR (bpm):  127  MPHR obtained (bpm):  133 % MPHR obtained:  89  Reached 85% MPHR (min:sec):  10:19 Total Exercise Time (min-sec):  11:00  Workload in METS:  13.3 Borg Scale: 15  Reason ETT Terminated:  patient's desire to stop    ST Segment Analysis At Rest: normal ST segments - no evidence of significant ST depression With Exercise: non-specific ST changes  Other Information Arrhythmia:  No Angina during ETT:  absent (0) Quality of ETT:  diagnostic  ETT Interpretation:  normal - no evidence of ischemia by ST analysis  Comments: Excellent exercise tolerance. No chest pain. Normal BP response to exercise. There was up-sloping ST depression. No significant ST-T changes to suggest ischemia.   Recommendations: F/u with Dr. Verne Carrow as directed. Signed,  Tereso Newcomer, PA-C   10/29/2012 11:16 AM

## 2012-12-03 ENCOUNTER — Other Ambulatory Visit (HOSPITAL_COMMUNITY): Payer: Self-pay | Admitting: Cardiovascular Disease

## 2012-12-03 ENCOUNTER — Ambulatory Visit (HOSPITAL_COMMUNITY): Payer: Medicare Other | Attending: Cardiovascular Disease | Admitting: Cardiology

## 2012-12-03 DIAGNOSIS — I2510 Atherosclerotic heart disease of native coronary artery without angina pectoris: Secondary | ICD-10-CM | POA: Insufficient documentation

## 2012-12-03 DIAGNOSIS — R0609 Other forms of dyspnea: Secondary | ICD-10-CM

## 2012-12-03 DIAGNOSIS — R0989 Other specified symptoms and signs involving the circulatory and respiratory systems: Secondary | ICD-10-CM | POA: Insufficient documentation

## 2012-12-03 DIAGNOSIS — I079 Rheumatic tricuspid valve disease, unspecified: Secondary | ICD-10-CM | POA: Insufficient documentation

## 2012-12-03 DIAGNOSIS — E785 Hyperlipidemia, unspecified: Secondary | ICD-10-CM | POA: Insufficient documentation

## 2012-12-03 DIAGNOSIS — R0600 Dyspnea, unspecified: Secondary | ICD-10-CM

## 2012-12-03 DIAGNOSIS — F17200 Nicotine dependence, unspecified, uncomplicated: Secondary | ICD-10-CM | POA: Insufficient documentation

## 2012-12-03 DIAGNOSIS — I10 Essential (primary) hypertension: Secondary | ICD-10-CM | POA: Insufficient documentation

## 2012-12-03 NOTE — Progress Notes (Signed)
Echo performed. 

## 2012-12-27 ENCOUNTER — Encounter: Payer: Self-pay | Admitting: Internal Medicine

## 2012-12-27 ENCOUNTER — Encounter: Payer: Self-pay | Admitting: Cardiology

## 2013-04-02 ENCOUNTER — Ambulatory Visit: Payer: Medicare Other | Admitting: Cardiovascular Disease

## 2013-04-11 DIAGNOSIS — H524 Presbyopia: Secondary | ICD-10-CM | POA: Diagnosis not present

## 2013-04-11 DIAGNOSIS — H35039 Hypertensive retinopathy, unspecified eye: Secondary | ICD-10-CM | POA: Diagnosis not present

## 2013-04-11 DIAGNOSIS — H00029 Hordeolum internum unspecified eye, unspecified eyelid: Secondary | ICD-10-CM | POA: Diagnosis not present

## 2013-04-11 DIAGNOSIS — H01009 Unspecified blepharitis unspecified eye, unspecified eyelid: Secondary | ICD-10-CM | POA: Diagnosis not present

## 2013-05-13 ENCOUNTER — Ambulatory Visit (INDEPENDENT_AMBULATORY_CARE_PROVIDER_SITE_OTHER): Payer: Medicare Other | Admitting: Cardiovascular Disease

## 2013-05-13 ENCOUNTER — Encounter: Payer: Self-pay | Admitting: Cardiovascular Disease

## 2013-05-13 VITALS — BP 140/68 | HR 69 | Ht 71.0 in | Wt 149.0 lb

## 2013-05-13 DIAGNOSIS — E785 Hyperlipidemia, unspecified: Secondary | ICD-10-CM

## 2013-05-13 DIAGNOSIS — Z720 Tobacco use: Secondary | ICD-10-CM

## 2013-05-13 DIAGNOSIS — I2510 Atherosclerotic heart disease of native coronary artery without angina pectoris: Secondary | ICD-10-CM

## 2013-05-13 DIAGNOSIS — I10 Essential (primary) hypertension: Secondary | ICD-10-CM | POA: Diagnosis not present

## 2013-05-13 DIAGNOSIS — F17200 Nicotine dependence, unspecified, uncomplicated: Secondary | ICD-10-CM

## 2013-05-13 MED ORDER — CLOPIDOGREL BISULFATE 75 MG PO TABS: 75.0000 mg | ORAL_TABLET | Freq: Every day | ORAL | Status: DC

## 2013-05-13 NOTE — Progress Notes (Signed)
History of Present Illness: 73 yo male with history of CAD, HTN, HLD who is here today for cardiac follow up. He has been followed in the past by Dr. Lia Foyer. 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 A 2.75 x 32 Taxus DES was placed in the mid RCA. A 2.5 x 8 mm Cypher DES was placed in OM1. A 3.0 x 18 mm Taxus DES was placed in the proximal Circumflex. He has had no cardiac catheterization since 2005.   He is here today for follow up. He tells me that he has had no chest pain, SOB, palpitations, near syncope or syncope. He occasionally smokes.   [facility]: [facility])  Last Lipid Profile:Lipid Panel     Component Value Date/Time   CHOL 122 10/03/2012 0949   TRIG 101.0 10/03/2012 0949   HDL 35.30* 10/03/2012 0949   CHOLHDL 3 10/03/2012 0949   VLDL 20.2 10/03/2012 0949   LDLCALC 67 10/03/2012 0949    Past Medical History  Diagnosis Date  . CAD (coronary artery disease)   . Unspecified essential hypertension   . Dyspnea on exertion   . Hyperlipidemia   . GERD (gastroesophageal reflux disease)   . Tobacco abuse   . Rectal bleeding     Past Surgical History  Procedure Laterality Date  . Ptca      And drug-eluting stent to the RCA  . Coronary angioplasty with stent placement      Drug eluting stent placed to the first obtuse marginal with a 2.58 mm cypher drug eluting stent. Circumflex was treated with 3.0 x 18 mm TAXUS drug-eluting stent on 07/17/03  . Eye surgery  1971    bilateral  . Rectal surgery  2008    dr. Rosana Hoes did this surgery    Current Outpatient Prescriptions  Medication Sig Dispense Refill  . aspirin 81 MG tablet Take 81 mg by mouth daily.        Marland Kitchen atorvastatin (LIPITOR) 40 MG tablet Take 1 tablet (40 mg total) by mouth at bedtime.  90 tablet  3  . metoprolol succinate (TOPROL-XL) 50 MG 24 hr tablet Take 1 tablet (50 mg total) by mouth daily.  90 tablet  3  . nitroGLYCERIN (NITROSTAT) 0.4 MG SL tablet Place 1 tablet (0.4 mg total) under  the tongue as directed.  25 tablet  2  . ramipril (ALTACE) 2.5 MG capsule Take 1 capsule (2.5 mg total) by mouth daily.  90 capsule  3   No current facility-administered medications for this visit.    No Known Allergies  History   Social History  . Marital Status: Married    Spouse Name: N/A    Number of Children: N/A  . Years of Education: N/A   Occupational History  . Retired Administrator   . Runs a cardiac rehab    Social History Main Topics  . Smoking status: Former Smoker -- 1.00 packs/day  . Smokeless tobacco: Not on file     Comment: Smoked for about 40 years, smokes cigarette every once in a while, maybe a pack a year.  . Alcohol Use: Yes  . Drug Use: No  . Sexual Activity: Not on file   Other Topics Concern  . Not on file   Social History Narrative   Lives in [place] with his wife   [place] veteran   Exercises 3 times a week at the [employer] History  Problem Relation Age  of Onset  . Cancer Mother     cervical  . Lung disease Father     Black lung    Review of Systems:  As stated in the HPI and otherwise negative.   BP 140/68  Pulse 69  Ht 5\' 11"  (1.803 m)  Wt 149 lb (67.586 kg)  BMI 20.79 kg/m2  Physical Examination: General: Well developed, well nourished, NAD HEENT: OP clear, mucus membranes moist SKIN: warm, dry. No rashes. Neuro: No focal deficits Musculoskeletal: Muscle strength 5/5 all ext Psychiatric: Mood and affect normal Neck: No JVD, no carotid bruits, no thyromegaly, no lymphadenopathy. Lungs:Clear bilaterally, no wheezes, rhonci, crackles Cardiovascular: Regular rate and rhythm. No murmurs, gallops or rubs. Abdomen:Soft. Bowel sounds present. Non-tender.  Extremities: No lower extremity edema. Pulses are 2 + in the bilateral DP/PT.  Echo 12/03/12: Left ventricle: The cavity size was normal. Wall thickness was normal. Systolic function was normal. The estimated ejection fraction was in the range of 50% to 55%.  Wall motion was normal; there were no regional wall motion abnormalities. Doppler parameters are consistent with abnormal left ventricular relaxation (grade 1 diastolic dysfunction). - Mitral valve: Calcified annulus.    Assessment and Plan:   1. CAD: Patient remains stable on single antiplatelet therapy.  Based on the results of the DAPT trial, will restart Plavix and then continue dual anti-platelet therapy long term. Continue statin and beta blocker. No ischemia on exercise stress test. LV function normal on echo.   2. HYPERTENSION: Controlled. No changes.       3. TOBACCO ABUSE:  Complete cessation encouraged.   4. HYPERLIPIDEMIA: Lipids at goal. On a statin.    5. Dyspnea: Resolved.

## 2013-05-13 NOTE — Patient Instructions (Signed)
Your physician wants you to follow-up in:  6 months.  You will receive a reminder letter in the mail two months in advance. If you don't receive a letter, please call our office to schedule the follow-up appointment.  Your physician has recommended you make the following change in your medication:  Start Clopidogrel 75 mg by mouth daily.   

## 2013-10-17 ENCOUNTER — Other Ambulatory Visit: Payer: Self-pay

## 2013-10-17 DIAGNOSIS — I2510 Atherosclerotic heart disease of native coronary artery without angina pectoris: Secondary | ICD-10-CM

## 2013-10-17 MED ORDER — METOPROLOL SUCCINATE ER 50 MG PO TB24: 50.0000 mg | ORAL_TABLET | Freq: Every day | ORAL | Status: DC

## 2013-10-17 MED ORDER — ATORVASTATIN CALCIUM 40 MG PO TABS: 40.0000 mg | ORAL_TABLET | Freq: Every day | ORAL | Status: DC

## 2013-10-17 MED ORDER — CLOPIDOGREL BISULFATE 75 MG PO TABS: 75.0000 mg | ORAL_TABLET | Freq: Every day | ORAL | Status: DC

## 2013-10-17 MED ORDER — RAMIPRIL 2.5 MG PO CAPS: 2.5000 mg | ORAL_CAPSULE | Freq: Every day | ORAL | Status: DC

## 2014-01-05 ENCOUNTER — Ambulatory Visit (INDEPENDENT_AMBULATORY_CARE_PROVIDER_SITE_OTHER): Payer: Medicare Other | Admitting: Cardiovascular Disease

## 2014-01-05 ENCOUNTER — Encounter: Payer: Self-pay | Admitting: Cardiovascular Disease

## 2014-01-05 VITALS — BP 130/70 | HR 61 | Ht 71.0 in | Wt 149.4 lb

## 2014-01-05 DIAGNOSIS — F17200 Nicotine dependence, unspecified, uncomplicated: Secondary | ICD-10-CM

## 2014-01-05 DIAGNOSIS — I2510 Atherosclerotic heart disease of native coronary artery without angina pectoris: Secondary | ICD-10-CM | POA: Diagnosis not present

## 2014-01-05 DIAGNOSIS — E785 Hyperlipidemia, unspecified: Secondary | ICD-10-CM

## 2014-01-05 DIAGNOSIS — Z720 Tobacco use: Secondary | ICD-10-CM | POA: Diagnosis not present

## 2014-01-05 DIAGNOSIS — I10 Essential (primary) hypertension: Secondary | ICD-10-CM

## 2014-01-05 LAB — LIPID PANEL
Cholesterol: 142 mg/dL (ref 0–200)
HDL: 32.8 mg/dL — AB (ref >39.00)
LDL Cholesterol: 79 mg/dL (ref 0–99)
NonHDL: 109.2
TRIGLYCERIDES: 150 mg/dL — AB (ref 0.0–149.0)
Total CHOL/HDL Ratio: 4
VLDL: 30 mg/dL (ref 0.0–40.0)

## 2014-01-05 LAB — CBC
HEMATOCRIT: 43.5 % (ref 39.0–52.0)
HEMOGLOBIN: 14.4 g/dL (ref 13.0–17.0)
MCHC: 33.1 g/dL (ref 30.0–36.0)
MCV: 94.5 fl (ref 78.0–100.0)
PLATELETS: 236 10*3/uL (ref 150.0–400.0)
RBC: 4.61 Mil/uL (ref 4.22–5.81)
RDW: 12.9 % (ref 11.5–15.5)
WBC: 7.1 10*3/uL (ref 4.0–10.5)

## 2014-01-05 LAB — BASIC METABOLIC PANEL
BUN: 20 mg/dL (ref 6–23)
CO2: 27 mEq/L (ref 19–32)
CREATININE: 1.1 mg/dL (ref 0.4–1.5)
Calcium: 9.4 mg/dL (ref 8.4–10.5)
Chloride: 103 mEq/L (ref 96–112)
GFR: 73.74 mL/min (ref >60.00)
GLUCOSE: 83 mg/dL (ref 70–99)
Potassium: 4 mEq/L (ref 3.5–5.1)
Sodium: 139 mEq/L (ref 135–145)

## 2014-01-05 LAB — HEPATIC FUNCTION PANEL
ALBUMIN: 4.3 g/dL (ref 3.5–5.2)
ALK PHOS: 88 U/L (ref 39–117)
ALT: 26 U/L (ref 0–53)
AST: 19 U/L (ref 0–37)
BILIRUBIN DIRECT: 0 mg/dL (ref 0.0–0.3)
Total Bilirubin: 0.4 mg/dL (ref 0.2–1.2)
Total Protein: 7.1 g/dL (ref 6.0–8.3)

## 2014-01-05 MED ORDER — RAMIPRIL 2.5 MG PO CAPS: 2.5000 mg | ORAL_CAPSULE | Freq: Every day | ORAL | Status: DC

## 2014-01-05 MED ORDER — METOPROLOL SUCCINATE ER 50 MG PO TB24: 50.0000 mg | ORAL_TABLET | Freq: Every day | ORAL | Status: DC

## 2014-01-05 MED ORDER — CLOPIDOGREL BISULFATE 75 MG PO TABS: 75.0000 mg | ORAL_TABLET | Freq: Every day | ORAL | Status: DC

## 2014-01-05 MED ORDER — ATORVASTATIN CALCIUM 40 MG PO TABS: 40.0000 mg | ORAL_TABLET | Freq: Every day | ORAL | Status: DC

## 2014-01-05 NOTE — Progress Notes (Signed)
History of Present Illness: 73 yo male with history of CAD, HTN, HLD who is here today for cardiac follow up. He has been followed in the past by Dr. Lia Foyer. 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 A 2.75 x 32 Taxus DES was placed in the mid RCA. A 2.5 x 8 mm Cypher DES was placed in OM1. A 3.0 x 18 mm Taxus DES was placed in the proximal Circumflex. He has had no cardiac catheterization since 2005. Exercise stress test September 2015 with no ischemia.   He is here today for follow up. He tells me that he has had no chest pain, SOB, palpitations, near syncope or syncope. He occasionally smokes. He has some heartburn after meals and diarrhea. No exertional chest pain.   [facility])  Last Lipid Profile:Lipid Panel     Component Value Date/Time   CHOL 122 10/03/2012 0949   TRIG 101.0 10/03/2012 0949   HDL 35.30* 10/03/2012 0949   CHOLHDL 3 10/03/2012 0949   VLDL 20.2 10/03/2012 0949   LDLCALC 67 10/03/2012 0949    Past Medical History  Diagnosis Date  . CAD (coronary artery disease)   . Unspecified essential hypertension   . Dyspnea on exertion   . Hyperlipidemia   . GERD (gastroesophageal reflux disease)   . Tobacco abuse   . Rectal bleeding     Past Surgical History  Procedure Laterality Date  . Ptca      And drug-eluting stent to the RCA  . Coronary angioplasty with stent placement      Drug eluting stent placed to the first obtuse marginal with a 2.58 mm cypher drug eluting stent. Circumflex was treated with 3.0 x 18 mm TAXUS drug-eluting stent on 07/17/03  . Eye surgery  1971    bilateral  . Rectal surgery  2008    dr. Rosana Hoes did this surgery    Current Outpatient Prescriptions  Medication Sig Dispense Refill  . aspirin 81 MG tablet Take 81 mg by mouth daily.      Marland Kitchen atorvastatin (LIPITOR) 40 MG tablet Take 1 tablet (40 mg total) by mouth at bedtime. 90 tablet 1  . clopidogrel (PLAVIX) 75 MG tablet Take 1 tablet (75  mg total) by mouth daily. 90 tablet 1  . metoprolol succinate (TOPROL-XL) 50 MG 24 hr tablet Take 1 tablet (50 mg total) by mouth daily. 90 tablet 1  . nitroGLYCERIN (NITROSTAT) 0.4 MG SL tablet Place 1 tablet (0.4 mg total) under the tongue as directed. 25 tablet 2  . ramipril (ALTACE) 2.5 MG capsule Take 1 capsule (2.5 mg total) by mouth daily. 90 capsule 1   No current facility-administered medications for this visit.    No Known Allergies  History   Social History  . Marital Status: Married    Spouse Name: N/A    Number of Children: N/A  . Years of Education: N/A   Occupational History  . Retired Administrator   . Runs a cardiac rehab    Social History Main Topics  . Smoking status: Former Smoker -- 1.00 packs/day  . Smokeless tobacco: Not on file     Comment: Smoked for about 40 years, smokes cigarette every once in a while, maybe a pack a year.  . Alcohol Use: Yes  . Drug Use: No  . Sexual Activity: Not on file   Other Topics Concern  . Not on file   Social History Narrative  Lives in [place] with his wife   [place] veteran   Exercises 3 times a week at the [employer]    Family History  Problem Relation Age of Onset  . Cancer Mother     cervical  . Lung disease Father     Black lung    Review of Systems:  As stated in the HPI and otherwise negative.   BP 130/70 mmHg  Pulse 61  Ht 5\' 11"  (1.803 m)  Wt 149 lb 6.4 oz (67.767 kg)  BMI 20.85 kg/m2  SpO2 91%  Physical Examination: General: Well developed, well nourished, NAD HEENT: OP clear, mucus membranes moist SKIN: warm, dry. No rashes. Neuro: No focal deficits Musculoskeletal: Muscle strength 5/5 all ext Psychiatric: Mood and affect normal Neck: No JVD, no carotid bruits, no thyromegaly, no lymphadenopathy. Lungs:Clear bilaterally, no wheezes, rhonci, crackles Cardiovascular: Regular rate and rhythm. No murmurs, gallops or rubs. Abdomen:Soft. Bowel sounds present. Non-tender.    Extremities: No lower extremity edema. Pulses are 2 + in the bilateral DP/PT.  Echo 12/03/12: Left ventricle: The cavity size was normal. Wall thickness was normal. Systolic function was normal. The estimated ejection fraction was in the range of 50% to 55%. Wall motion was normal; there were no regional wall motion abnormalities. Doppler parameters are consistent with abnormal left ventricular relaxation (grade 1 diastolic dysfunction). - Mitral valve: Calcified annulus.  Assessment and Plan:   1. CAD: He is doing well. No recent angina. Will continue ASA, Plavix (First generation DES in place). Continue statin and beta blocker. No ischemia on exercise stress test September 2014.  LV function normal on echo 12/03/12. Check CBC on ASA and Plavix.   2. HYPERTENSION: Controlled. No changes today. Check BMET today.      3. TOBACCO ABUSE:  Complete cessation encouraged. He is only smoking 20 cigarettes per month.   4. HYPERLIPIDEMIA: Lipids at goal August 2014. On a statin.  Check lipids and LFTs today  5. GERD: Chest burning after meals. He is planning to see primary care soon for evaluation. May need GI workup. If he has gastritis, would stop ASA but continue Plavix.

## 2014-01-05 NOTE — Patient Instructions (Signed)
Your physician wants you to follow-up in:  6 months. You will receive a reminder letter in the mail two months in advance. If you don't receive a letter, please call our office to schedule the follow-up appointment.   

## 2014-06-23 ENCOUNTER — Encounter: Payer: Self-pay | Admitting: Cardiovascular Disease

## 2014-06-23 ENCOUNTER — Ambulatory Visit (INDEPENDENT_AMBULATORY_CARE_PROVIDER_SITE_OTHER): Payer: Medicare Other | Admitting: Cardiovascular Disease

## 2014-06-23 VITALS — BP 116/68 | HR 74 | Ht 71.0 in | Wt 143.0 lb

## 2014-06-23 DIAGNOSIS — F17200 Nicotine dependence, unspecified, uncomplicated: Secondary | ICD-10-CM

## 2014-06-23 DIAGNOSIS — I2510 Atherosclerotic heart disease of native coronary artery without angina pectoris: Secondary | ICD-10-CM | POA: Diagnosis not present

## 2014-06-23 DIAGNOSIS — I10 Essential (primary) hypertension: Secondary | ICD-10-CM

## 2014-06-23 DIAGNOSIS — Z720 Tobacco use: Secondary | ICD-10-CM

## 2014-06-23 DIAGNOSIS — E785 Hyperlipidemia, unspecified: Secondary | ICD-10-CM | POA: Diagnosis not present

## 2014-06-23 NOTE — Patient Instructions (Signed)
Medication Instructions:  Your physician recommends that you continue on your current medications as directed. Please refer to the Current Medication list given to you today.   Labwork: none  Testing/Procedures: none  Follow-Up: Your physician wants you to follow-up in: 6 months.  You will receive a reminder letter in the mail two months in advance. If you don't receive a letter, please call our office to schedule the follow-up appointment.       

## 2014-06-23 NOTE — Progress Notes (Signed)
Chief Complaint  Patient presents with  . Follow-up     History of Present Illness: 74 yo male with history of CAD, HTN, HLD who is here today for cardiac follow up. He has been followed in the past by Dr. Lia Foyer. 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 A 2.75 x 32 Taxus DES was placed in the mid RCA. A 2.5 x 8 mm Cypher DES was placed in OM1. A 3.0 x 18 mm Taxus DES was placed in the proximal Circumflex. He has had no cardiac catheterization since 2005. Exercise stress test September 2015 with no ischemia.   He is here today for follow up. He tells me that he has had no chest pain, SOB, palpitations, near syncope or syncope. He occasionally smokes. He has some heartburn after meals and diarrhea. No exertional chest pain.   [facility]  Last Lipid Profile:Lipid Panel     Component Value Date/Time   CHOL 142 01/05/2014 0919   TRIG 150.0* 01/05/2014 0919   HDL 32.80* 01/05/2014 0919   CHOLHDL 4 01/05/2014 0919   VLDL 30.0 01/05/2014 0919   LDLCALC 79 01/05/2014 0919    Past Medical History  Diagnosis Date  . CAD (coronary artery disease)   . Unspecified essential hypertension   . Dyspnea on exertion   . Hyperlipidemia   . GERD (gastroesophageal reflux disease)   . Tobacco abuse   . Rectal bleeding     Past Surgical History  Procedure Laterality Date  . Ptca      And drug-eluting stent to the RCA  . Coronary angioplasty with stent placement      Drug eluting stent placed to the first obtuse marginal with a 2.58 mm cypher drug eluting stent. Circumflex was treated with 3.0 x 18 mm TAXUS drug-eluting stent on 07/17/03  . Eye surgery  1971    bilateral  . Rectal surgery  2008    dr. Rosana Hoes did this surgery    Current Outpatient Prescriptions  Medication Sig Dispense Refill  . atorvastatin (LIPITOR) 40 MG tablet Take 1 tablet (40 mg total) by mouth at bedtime. 90 tablet 3  . clopidogrel (PLAVIX) 75 MG tablet Take 1 tablet  (75 mg total) by mouth daily. 90 tablet 3  . metoprolol succinate (TOPROL-XL) 50 MG 24 hr tablet Take 1 tablet (50 mg total) by mouth daily. 90 tablet 3  . nitroGLYCERIN (NITROSTAT) 0.4 MG SL tablet Place 1 tablet (0.4 mg total) under the tongue as directed. 25 tablet 2  . ramipril (ALTACE) 2.5 MG capsule Take 1 capsule (2.5 mg total) by mouth daily. 90 capsule 3   No current facility-administered medications for this visit.    No Known Allergies  History   Social History  . Marital Status: Married    Spouse Name: N/A  . Number of Children: N/A  . Years of Education: N/A   Occupational History  . Retired Administrator   . Runs a cardiac rehab    Social History Main Topics  . Smoking status: Former Smoker -- 1.00 packs/day  . Smokeless tobacco: Not on file     Comment: Smoked for about 40 years, smokes cigarette every once in a while, maybe a pack a year.  . Alcohol Use: Yes  . Drug Use: No  . Sexual Activity: Not on file   Other Topics Concern  . Not on file   Social History Narrative   Lives in [place] with  his wife   [place] veteran   Exercises 3 times a week at the [employer]    Family History  Problem Relation Age of Onset  . Cancer Mother     cervical  . Lung disease Father     Black lung    Review of Systems:  As stated in the HPI and otherwise negative.   BP 116/68 mmHg  Pulse 74  Ht 5\' 11"  (1.803 m)  Wt 143 lb (64.864 kg)  BMI 19.95 kg/m2  Physical Examination: General: Well developed, well nourished, NAD HEENT: OP clear, mucus membranes moist SKIN: warm, dry. No rashes. Neuro: No focal deficits Musculoskeletal: Muscle strength 5/5 all ext Psychiatric: Mood and affect normal Neck: No JVD, no carotid bruits, no thyromegaly, no lymphadenopathy. Lungs:Clear bilaterally, no wheezes, rhonci, crackles Cardiovascular: Regular rate and rhythm. No murmurs, gallops or rubs. Abdomen:Soft. Bowel sounds present. Non-tender.  Extremities: No lower  extremity edema. Pulses are 2 + in the bilateral DP/PT.  Echo 12/03/12: Left ventricle: The cavity size was normal. Wall thickness was normal. Systolic function was normal. The estimated ejection fraction was in the range of 50% to 55%. Wall motion was normal; there were no regional wall motion abnormalities. Doppler parameters are consistent with abnormal left ventricular relaxation (grade 1 diastolic dysfunction). - Mitral valve: Calcified annulus.  EKG:  EKG is ordered today. The ekg ordered today demonstrates NSR, rate 74 bpm. Non-specific T wave abnormality.   Recent Labs: 01/05/2014: ALT 26; BUN 20; Creatinine 1.1; Hemoglobin 14.4; Platelets 236.0; Potassium 4.0; Sodium 139   Lipid Panel    Component Value Date/Time   CHOL 142 01/05/2014 0919   TRIG 150.0* 01/05/2014 0919   HDL 32.80* 01/05/2014 0919   CHOLHDL 4 01/05/2014 0919   VLDL 30.0 01/05/2014 0919   LDLCALC 79 01/05/2014 0919     Wt Readings from Last 3 Encounters:  06/23/14 143 lb (64.864 kg)  01/05/14 149 lb 6.4 oz (67.767 kg)  05/13/13 149 lb (67.586 kg)     Other studies Reviewed: Additional studies/ records that were reviewed today include: . Review of the above records demonstrates:    Assessment and Plan:   1. CAD: He is doing well. No recent angina. He has been having bruising on arms, even without trauma. He stopped his ASA. He is still on Plavix (First generation DES in place). Will continue Plavix only for now. If bruising continues, will stop Plavix and restart ASA. Continue statin and beta blocker. No ischemia on exercise stress test September 2014.  LV function normal on echo 12/03/12.  2. HYPERTENSION: Controlled. No changes today.      3. TOBACCO ABUSE:  Complete cessation encouraged. He is only smoking 30-40 cigarettes per month.   4. HYPERLIPIDEMIA: Lipids at goal November 2015. On a statin. LFTs ok November 2015.   Current medicines are reviewed at length with the patient today.  The  patient does not have concerns regarding medicines.  The following changes have been made:  no change  Labs/ tests ordered today include:    Orders Placed This Encounter  Procedures  . EKG 12-Lead    Disposition:   FU with me in 6 months  Signed, Lauree Chandler, MD 06/23/2014 2:23 PM    [facility] [address], [place], [place]  [address] Phone: [phone]; Fax: [phone]

## 2014-08-18 ENCOUNTER — Telehealth: Payer: Self-pay | Admitting: Cardiovascular Disease

## 2014-08-18 NOTE — Telephone Encounter (Signed)
Returned call to patient Patient is going to follow up with his primary facility at [facility] in [place].  He noticed a knot under right breast about an inch and a hlf below the right nipple on Thursday.  No redness or warmth.  Is concerned that it could be a cancer or cyst.

## 2014-08-18 NOTE — Telephone Encounter (Signed)
New message      Pt has a knot under right breast, inch and half below the nipple.  Pt would like to discuss his options.  Please advise

## 2014-08-19 NOTE — Telephone Encounter (Signed)
Thanks

## 2014-08-21 ENCOUNTER — Emergency Department (HOSPITAL_COMMUNITY)
Admission: EM | Admit: 2014-08-21 | Discharge: 2014-08-21 | Disposition: A | Discharge status: Home / Self Care | Payer: Medicare Other | Attending: Emergency Medicine | Admitting: Emergency Medicine

## 2014-08-21 ENCOUNTER — Encounter (HOSPITAL_COMMUNITY): Payer: Self-pay | Admitting: Emergency Medicine

## 2014-08-21 DIAGNOSIS — I10 Essential (primary) hypertension: Secondary | ICD-10-CM | POA: Diagnosis not present

## 2014-08-21 DIAGNOSIS — I2510 Atherosclerotic heart disease of native coronary artery without angina pectoris: Secondary | ICD-10-CM | POA: Diagnosis not present

## 2014-08-21 DIAGNOSIS — R591 Generalized enlarged lymph nodes: Secondary | ICD-10-CM

## 2014-08-21 DIAGNOSIS — E785 Hyperlipidemia, unspecified: Secondary | ICD-10-CM | POA: Diagnosis not present

## 2014-08-21 DIAGNOSIS — Z79899 Other long term (current) drug therapy: Secondary | ICD-10-CM | POA: Diagnosis not present

## 2014-08-21 DIAGNOSIS — Z9861 Coronary angioplasty status: Secondary | ICD-10-CM | POA: Insufficient documentation

## 2014-08-21 DIAGNOSIS — K219 Gastro-esophageal reflux disease without esophagitis: Secondary | ICD-10-CM | POA: Diagnosis not present

## 2014-08-21 DIAGNOSIS — Z87891 Personal history of nicotine dependence: Secondary | ICD-10-CM | POA: Insufficient documentation

## 2014-08-21 DIAGNOSIS — Z7902 Long term (current) use of antithrombotics/antiplatelets: Secondary | ICD-10-CM | POA: Insufficient documentation

## 2014-08-21 DIAGNOSIS — L729 Follicular cyst of the skin and subcutaneous tissue, unspecified: Secondary | ICD-10-CM | POA: Diagnosis present

## 2014-08-21 DIAGNOSIS — R590 Localized enlarged lymph nodes: Secondary | ICD-10-CM | POA: Insufficient documentation

## 2014-08-21 NOTE — Discharge Instructions (Signed)
Swollen Lymph Nodes  The lymphatic system filters fluid from around cells. It is like a system of blood vessels. These channels carry lymph instead of blood. The lymphatic system is an important part of the immune (disease fighting) system. When people talk about "swollen glands in the neck," they are usually talking about swollen lymph nodes. The lymph nodes are like the little traps for infection. You and your caregiver may be able to feel lymph nodes, especially swollen nodes, in these common areas: the groin (inguinal area), armpits (axilla), and above the clavicle (supraclavicular). You may also feel them in the neck (cervical) and the back of the head just above the hairline (occipital).  Swollen glands occur when there is any condition in which the body responds with an allergic type of reaction. For instance, the glands in the neck can become swollen from insect bites or any type of minor infection on the head. These are very noticeable in children with only minor problems. Lymph nodes may also become swollen when there is a tumor or problem with the lymphatic system, such as Hodgkin's disease.  TREATMENT    Most swollen glands do not require treatment. They can be observed (watched) for a short period of time, if your caregiver feels it is necessary. Most of the time, observation is not necessary.   Antibiotics (medicines that kill germs) may be prescribed by your caregiver. Your caregiver may prescribe these if he or she feels the swollen glands are due to a bacterial (germ) infection. Antibiotics are not used if the swollen glands are caused by a virus.  HOME CARE INSTRUCTIONS    Take medications as directed by your caregiver. Only take over-the-counter or prescription medicines for pain, discomfort, or fever as directed by your caregiver.  SEEK MEDICAL CARE IF:    If you begin to run a temperature greater than 102 F (38.9 C), or as your caregiver suggests.  MAKE SURE YOU:    Understand these  instructions.   Will watch your condition.   Will get help right away if you are not doing well or get worse.  Document Released: 01/20/2002 Document Revised: 04/24/2011 Document Reviewed: 01/30/2005  ExitCare Patient Information 2015 [employer]. This information is not intended to replace advice given to you by your health care provider. Make sure you discuss any questions you have with your health care provider.

## 2014-08-21 NOTE — ED Notes (Signed)
Patient in with c/o of two knots near right chest that he noticed last Thursday. Denies pain. No drainage.

## 2014-08-21 NOTE — ED Provider Notes (Signed)
CSN: [id number]     Arrival date & time 08/21/14  1049 History   First MD Initiated Contact with Patient 08/21/14 1102     Chief Complaint  Patient presents with  . Cyst     (Consider location/radiation/quality/duration/timing/severity/associated sxs/prior Treatment) Patient is a 74 y.o. male presenting with general illness.  Illness Location:  R chest wall Quality:  Swelling x 2 Severity:  Moderate Onset quality:  Gradual Duration:  5 days Timing:  Constant Progression:  Worsening Chronicity:  New Context:  Ho CAD, tobacco abuse Relieved by:  Nothing Worsened by:  Nothing Associated symptoms: no abdominal pain, no chest pain, no nausea, no shortness of breath and no vomiting     Past Medical History  Diagnosis Date  . CAD (coronary artery disease)   . Unspecified essential hypertension   . Dyspnea on exertion   . Hyperlipidemia   . GERD (gastroesophageal reflux disease)   . Tobacco abuse   . Rectal bleeding    Past Surgical History  Procedure Laterality Date  . Ptca      And drug-eluting stent to the RCA  . Coronary angioplasty with stent placement      Drug eluting stent placed to the first obtuse marginal with a 2.58 mm cypher drug eluting stent. Circumflex was treated with 3.0 x 18 mm TAXUS drug-eluting stent on 07/17/03  . Eye surgery  1971    bilateral  . Rectal surgery  2008    dr. Rosana Hoes did this surgery   Family History  Problem Relation Age of Onset  . Cancer Mother     cervical  . Lung disease Father     Black lung   History  Substance Use Topics  . Smoking status: Former Smoker -- 1.00 packs/day  . Smokeless tobacco: Not on file     Comment: Smoked for about 40 years, smokes cigarette every once in a while, maybe a pack a year.  . Alcohol Use: Yes    Review of Systems  Constitutional: Negative for unexpected weight change.  Respiratory: Negative for shortness of breath.   Cardiovascular: Negative for chest pain.  Gastrointestinal: Negative for  nausea, vomiting and abdominal pain.  All other systems reviewed and are negative.     Allergies  Review of patient's allergies indicates no known allergies.  Home Medications   Prior to Admission medications   Medication Sig Start Date End Date Taking? Authorizing Provider  atorvastatin (LIPITOR) 40 MG tablet Take 1 tablet (40 mg total) by mouth at bedtime. 01/05/14  Yes Burnell Blanks, MD  clopidogrel (PLAVIX) 75 MG tablet Take 1 tablet (75 mg total) by mouth daily. 01/05/14  Yes Burnell Blanks, MD  metoprolol succinate (TOPROL-XL) 50 MG 24 hr tablet Take 1 tablet (50 mg total) by mouth daily. 01/05/14  Yes Burnell Blanks, MD  ramipril (ALTACE) 2.5 MG capsule Take 1 capsule (2.5 mg total) by mouth daily. 01/05/14  Yes Burnell Blanks, MD  nitroGLYCERIN (NITROSTAT) 0.4 MG SL tablet Place 1 tablet (0.4 mg total) under the tongue as directed. Patient not taking: Reported on 08/21/2014 09/04/11   Hillary Bow, MD   BP 126/61 mmHg  Pulse 57  Temp(Src) 97.8 F (36.6 C) (Oral)  Resp 14  SpO2 97% Physical Exam  Constitutional: He is oriented to person, place, and time. He appears well-developed and well-nourished.  HENT:  Head: Normocephalic and atraumatic.  Eyes: Conjunctivae and EOM are normal.  Neck: Normal range of motion. Neck supple.  Cardiovascular: Normal rate, regular rhythm and normal heart sounds.   Pulmonary/Chest: Effort normal and breath sounds normal. No respiratory distress.  2 mobile firm 1cm swelling under R breast tissue  Abdominal: He exhibits no distension. There is no tenderness. There is no rebound and no guarding.  Musculoskeletal: Normal range of motion.  Neurological: He is alert and oriented to person, place, and time.  Skin: Skin is warm and dry.  Vitals reviewed.   ED Course  Procedures (including critical care time) Labs Review Labs Reviewed - No data to display  Imaging Review No results found.   EKG  Interpretation None      MDM   Final diagnoses:  Lymphadenopathy    74 y.o. male with pertinent PMH of CAD, 40 pack year history presents with 2 mobile chest wall masses.  Location was just under R breast.  No pain, fluctuance.  No systemic symptoms.  Discussed differential of LAD, possible ca.  Discussed importance of close fu.  DC home in stable condition.    I have reviewed all laboratory and imaging studies if ordered as above  1. Lymphadenopathy         Debby Freiberg, MD 08/21/14 1235

## 2015-01-14 NOTE — Progress Notes (Signed)
Chief Complaint  Patient presents with  . Follow-up    follow up CAD/pt has no complaints     History of Present Illness: 74 yo male with history of CAD, HTN, HLD who is here today for cardiac follow up. He has been followed in the past by Dr. Lia Foyer. 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 A 2.75 x 32 Taxus DES was placed in the mid RCA. A 2.5 x 8 mm Cypher DES was placed in OM1. A 3.0 x 18 mm Taxus DES was placed in the proximal Circumflex. He has had no cardiac catheterization since 2005. Exercise stress test July 2016 at the [facility] and the patient tells me there was no ischemia.    He is here today for follow up. He tells me that he has had no chest pain, SOB, palpitations, near syncope or syncope. He occasionally smokes. He has no change in bruising on arms.   [facility] Rogue[facility]  Past Medical History  Diagnosis Date  . CAD (coronary artery disease)   . Unspecified essential hypertension   . Dyspnea on exertion   . Hyperlipidemia   . GERD (gastroesophageal reflux disease)   . Tobacco abuse   . Rectal bleeding     Past Surgical History  Procedure Laterality Date  . Ptca      And drug-eluting stent to the RCA  . Coronary angioplasty with stent placement      Drug eluting stent placed to the first obtuse marginal with a 2.58 mm cypher drug eluting stent. Circumflex was treated with 3.0 x 18 mm TAXUS drug-eluting stent on 07/17/03  . Eye surgery  1971    bilateral  . Rectal surgery  2008    dr. Rosana Hoes did this surgery    Current Outpatient Prescriptions  Medication Sig Dispense Refill  . atorvastatin (LIPITOR) 40 MG tablet Take 1 tablet (40 mg total) by mouth at bedtime. 90 tablet 3  . clopidogrel (PLAVIX) 75 MG tablet Take 1 tablet (75 mg total) by mouth daily. 90 tablet 3  . metoprolol succinate (TOPROL-XL) 50 MG 24 hr tablet Take 1 tablet (50 mg total) by mouth daily. 90 tablet 3  . Multiple Vitamins-Minerals (MULTIVITAMIN WITH  MINERALS) tablet Take 1 tablet by mouth daily.    . nitroGLYCERIN (NITROSTAT) 0.4 MG SL tablet Place 1 tablet (0.4 mg total) under the tongue as directed. 25 tablet 2  . losartan (COZAAR) 25 MG tablet Take 1 tablet (25 mg total) by mouth daily. 90 tablet 3   No current facility-administered medications for this visit.    No Known Allergies  Social History   Social History  . Marital Status: Married    Spouse Name: N/A  . Number of Children: N/A  . Years of Education: N/A   Occupational History  . Retired Administrator   . Runs a cardiac rehab    Social History Main Topics  . Smoking status: Former Smoker -- 1.00 packs/day  . Smokeless tobacco: Not on file     Comment: Smoked for about 40 years, smokes cigarette every once in a while, maybe a pack a year.  . Alcohol Use: Yes  . Drug Use: No  . Sexual Activity: Not on file   Other Topics Concern  . Not on file   Social History Narrative   Lives in [place] with his wife   [place] veteran   Exercises 3 times a week at the [employer]  Family History  Problem Relation Age of Onset  . Cancer Mother     cervical  . Lung disease Father     Black lung    Review of Systems:  As stated in the HPI and otherwise negative.   BP 110/70 mmHg  Pulse 79  Ht 5\' 11"  (1.803 m)  Wt 139 lb 3.2 oz (63.141 kg)  BMI 19.42 kg/m2  SpO2 99%  Physical Examination: General: Well developed, well nourished, NAD HEENT: OP clear, mucus membranes moist SKIN: warm, dry. No rashes. Neuro: No focal deficits Musculoskeletal: Muscle strength 5/5 all ext Psychiatric: Mood and affect normal Neck: No JVD, no carotid bruits, no thyromegaly, no lymphadenopathy. Lungs:Clear bilaterally, no wheezes, rhonci, crackles Cardiovascular: Regular rate and rhythm. No murmurs, gallops or rubs. Abdomen:Soft. Bowel sounds present. Non-tender.  Extremities: No lower extremity edema. Pulses are 2 + in the bilateral DP/PT.  Echo 12/03/12: Left  ventricle: The cavity size was normal. Wall thickness was normal. Systolic function was normal. The estimated ejection fraction was in the range of 50% to 55%. Wall motion was normal; there were no regional wall motion abnormalities. Doppler parameters are consistent with abnormal left ventricular relaxation (grade 1 diastolic dysfunction). - Mitral valve: Calcified annulus.  EKG:  EKG is not ordered today. The ekg ordered today demonstrates   Recent Labs: No results found for requested labs within last 365 days.   Lipid Panel    Component Value Date/Time   CHOL 142 01/05/2014 0919   TRIG 150.0* 01/05/2014 0919   HDL 32.80* 01/05/2014 0919   CHOLHDL 4 01/05/2014 0919   VLDL 30.0 01/05/2014 0919   LDLCALC 79 01/05/2014 0919     Wt Readings from Last 3 Encounters:  01/15/15 139 lb 3.2 oz (63.141 kg)  06/23/14 143 lb (64.864 kg)  01/05/14 149 lb 6.4 oz (67.767 kg)     Other studies Reviewed: Additional studies/ records that were reviewed today include: . Review of the above records demonstrates:    Assessment and Plan:   1. CAD: He is doing well. No recent angina. He is on Plavix (First generation DES in place). Will continue Plavix only for now. Continue statin and beta blocker. No ischemia on exercise stress test July 2016 at the [place]. LV function normal on echo 12/03/12.  2. HYPERTENSION: Controlled. No changes today.      3. TOBACCO ABUSE:  Complete cessation encouraged.   4. HYPERLIPIDEMIA: Lipids at goal at the [place]. Continue statin. LFTs ok November 2015.   5. Dry cough: stop Ramipril. Start Cozaar 25 mg daily.   Current medicines are reviewed at length with the patient today.  The patient does not have concerns regarding medicines.  The following changes have been made:  no change  Labs/ tests ordered today include:    No orders of the defined types were placed in this encounter.    Disposition:   FU with me in 6 months  Signed, Lauree Chandler,  MD 01/15/2015 9:56 AM    [facility] [place], [place], [place]  [address] Phone: [phone]; Fax: [phone]

## 2015-01-15 ENCOUNTER — Ambulatory Visit (INDEPENDENT_AMBULATORY_CARE_PROVIDER_SITE_OTHER): Payer: Medicare Other | Admitting: Cardiovascular Disease

## 2015-01-15 ENCOUNTER — Encounter: Payer: Self-pay | Admitting: Cardiovascular Disease

## 2015-01-15 VITALS — BP 110/70 | HR 79 | Ht 71.0 in | Wt 139.2 lb

## 2015-01-15 DIAGNOSIS — I10 Essential (primary) hypertension: Secondary | ICD-10-CM | POA: Diagnosis not present

## 2015-01-15 DIAGNOSIS — I2510 Atherosclerotic heart disease of native coronary artery without angina pectoris: Secondary | ICD-10-CM

## 2015-01-15 DIAGNOSIS — E785 Hyperlipidemia, unspecified: Secondary | ICD-10-CM | POA: Diagnosis not present

## 2015-01-15 DIAGNOSIS — Z720 Tobacco use: Secondary | ICD-10-CM

## 2015-01-15 MED ORDER — LOSARTAN POTASSIUM 25 MG PO TABS: 25.0000 mg | ORAL_TABLET | Freq: Every day | ORAL | Status: DC

## 2015-01-15 NOTE — Patient Instructions (Signed)
Medication Instructions:  Your physician has recommended you make the following change in your medication:  Stop Ramipril. Start Cozaar 25 mg by mouth daily   Labwork: none  Testing/Procedures: none  Follow-Up: Your physician wants you to follow-up in: 12 months.  You will receive a reminder letter in the mail two months in advance. If you don't receive a letter, please call our office to schedule the follow-up appointment.     If you need a refill on your cardiac medications before your next appointment, please call your pharmacy.

## 2015-01-25 ENCOUNTER — Other Ambulatory Visit: Payer: Self-pay | Admitting: Cardiovascular Disease

## 2015-02-03 ENCOUNTER — Telehealth: Payer: Self-pay | Admitting: Cardiovascular Disease

## 2015-02-03 DIAGNOSIS — E785 Hyperlipidemia, unspecified: Secondary | ICD-10-CM

## 2015-02-03 DIAGNOSIS — I2510 Atherosclerotic heart disease of native coronary artery without angina pectoris: Secondary | ICD-10-CM

## 2015-02-03 MED ORDER — ATORVASTATIN CALCIUM 40 MG PO TABS: 40.0000 mg | ORAL_TABLET | Freq: Every day | ORAL | Status: DC

## 2015-02-03 MED ORDER — METOPROLOL SUCCINATE ER 50 MG PO TB24: 50.0000 mg | ORAL_TABLET | Freq: Every day | ORAL | Status: DC

## 2015-02-03 NOTE — Telephone Encounter (Signed)
New message       *STAT* If patient is at the pharmacy, call can be transferred to refill team.   1. Which medications need to be refilled? (please list name of each medication and dose if known) metoprolol 50mg , generic plavix 75mg , atorvastatin 40mg  2. Which pharmacy/location (including street and city if local pharmacy) is medication to be sent to?  [facility]  3. Do they need a 30 day or 90 day supply? 90day supply

## 2015-02-16 ENCOUNTER — Telehealth: Payer: Self-pay | Admitting: *Deleted

## 2015-02-16 DIAGNOSIS — I2510 Atherosclerotic heart disease of native coronary artery without angina pectoris: Secondary | ICD-10-CM

## 2015-02-16 MED ORDER — METOPROLOL SUCCINATE ER 50 MG PO TB24: 50.0000 mg | ORAL_TABLET | Freq: Every day | ORAL | Status: DC

## 2015-02-16 NOTE — Telephone Encounter (Signed)
metoprolol succinate (TOPROL-XL) 50 MG 24 hr tablet  Medication   Date: 02/03/2015  Department: [place] Office  Ordering/Authorizing: Burnell Blanks, MD      Order Providers    Prescribing Provider Encounter Provider   Burnell Blanks, MD Burnell Blanks, MD    Medication Detail      Disp Refills Start End     metoprolol succinate (TOPROL-XL) 50 MG 24 hr tablet 90 tablet 3 02/03/2015     Sig - Route: Take 1 tablet (50 mg total) by mouth daily. - Oral    E-Prescribing Status: Receipt confirmed by pharmacy (02/03/2015 1:18 PM EST)     Associated Diagnoses    Atherosclerosis of native coronary artery of native heart without angina pectoris - Primary       [facility]    [facility] [place] [place]   needed refill. was sent 02/03/15 with 90/3, see notes  Called [facility], verified with Santiago Glad, he does have refills, pt will have to call & order. Called pt and he expressed understanding.

## 2015-03-15 ENCOUNTER — Other Ambulatory Visit: Payer: Self-pay | Admitting: Cardiovascular Disease

## 2015-04-21 ENCOUNTER — Encounter (HOSPITAL_COMMUNITY): Payer: Self-pay | Admitting: *Deleted

## 2015-04-21 ENCOUNTER — Emergency Department (INDEPENDENT_AMBULATORY_CARE_PROVIDER_SITE_OTHER)
Admission: EM | Admit: 2015-04-21 | Discharge: 2015-04-21 | Disposition: A | Discharge status: Home / Self Care | Payer: Medicare Other | Source: Home / Self Care | Attending: Family Medicine | Admitting: Family Medicine

## 2015-04-21 ENCOUNTER — Emergency Department (INDEPENDENT_AMBULATORY_CARE_PROVIDER_SITE_OTHER): Payer: Medicare Other

## 2015-04-21 DIAGNOSIS — J111 Influenza due to unidentified influenza virus with other respiratory manifestations: Secondary | ICD-10-CM

## 2015-04-21 DIAGNOSIS — J439 Emphysema, unspecified: Secondary | ICD-10-CM

## 2015-04-21 DIAGNOSIS — R69 Illness, unspecified: Principal | ICD-10-CM

## 2015-04-21 DIAGNOSIS — R05 Cough: Secondary | ICD-10-CM | POA: Diagnosis not present

## 2015-04-21 DIAGNOSIS — R509 Fever, unspecified: Secondary | ICD-10-CM | POA: Diagnosis not present

## 2015-04-21 MED ORDER — HYDROCOD POLST-CPM POLST ER 10-8 MG/5ML PO SUER: 5.0000 mL | Freq: Two times a day (BID) | ORAL | Status: DC | PRN

## 2015-04-21 NOTE — Discharge Instructions (Signed)
Drink plenty of fluids as discussed, use medicine as prescribed, or mucinex or delsym for cough. Return or see your doctor if further problems

## 2015-04-21 NOTE — ED Notes (Signed)
Pt  Reports   Cough  /  Congested      Body  Aches  And  Fever    X  5  Days      Smoker         also  Has  Diarrhea     No  Appetite          sitting  Upright  On  Exam table  Speaking in  Complete  sentances   Cough   Is  Non  Productive

## 2015-04-21 NOTE — ED Provider Notes (Signed)
CSN: [id number]     Arrival date & time 04/21/15  1259 History   First MD Initiated Contact with Patient 04/21/15 1323     Chief Complaint  Patient presents with  . Cough   (Consider location/radiation/quality/duration/timing/severity/associated sxs/prior Treatment) Patient is a 75 y.o. male presenting with cough. The history is provided by the patient.  Cough Cough characteristics:  Non-productive, dry and hacking Severity:  Moderate Onset quality:  Gradual Duration:  5 days Progression:  Unchanged Chronicity:  New Smoker: yes   Relieved by:  None tried Worsened by:  Nothing tried Ineffective treatments:  None tried Associated symptoms: fever, myalgias, rhinorrhea and shortness of breath   Associated symptoms: no chest pain     Past Medical History  Diagnosis Date  . CAD (coronary artery disease)   . Unspecified essential hypertension   . Dyspnea on exertion   . Hyperlipidemia   . GERD (gastroesophageal reflux disease)   . Tobacco abuse   . Rectal bleeding    Past Surgical History  Procedure Laterality Date  . Ptca      And drug-eluting stent to the RCA  . Coronary angioplasty with stent placement      Drug eluting stent placed to the first obtuse marginal with a 2.58 mm cypher drug eluting stent. Circumflex was treated with 3.0 x 18 mm TAXUS drug-eluting stent on 07/17/03  . Eye surgery  1971    bilateral  . Rectal surgery  2008    dr. Rosana Hoes did this surgery   Family History  Problem Relation Age of Onset  . Cancer Mother     cervical  . Lung disease Father     Black lung   Social History  Substance Use Topics  . Smoking status: Former Smoker -- 1.00 packs/day  . Smokeless tobacco: Not on file     Comment: Smoked for about 40 years, smokes cigarette every once in a while, maybe a pack a year.  . Alcohol Use: Yes    Review of Systems  Constitutional: Positive for fever.  HENT: Positive for congestion and rhinorrhea.   Respiratory: Positive for cough and  shortness of breath.   Cardiovascular: Negative.  Negative for chest pain.  Gastrointestinal: Positive for diarrhea. Negative for nausea and vomiting.  Musculoskeletal: Positive for myalgias.  Skin: Negative.   All other systems reviewed and are negative.   Allergies  Review of patient's allergies indicates no known allergies.  Home Medications   Prior to Admission medications   Medication Sig Start Date End Date Taking? Authorizing Provider  atorvastatin (LIPITOR) 40 MG tablet Take 1 tablet (40 mg total) by mouth at bedtime. 02/03/15   Burnell Blanks, MD  clopidogrel (PLAVIX) 75 MG tablet TAKE 1 TABLET DAILY 01/25/15   Burnell Blanks, MD  losartan (COZAAR) 25 MG tablet Take 1 tablet (25 mg total) by mouth daily. 01/15/15   Burnell Blanks, MD  metoprolol succinate (TOPROL-XL) 50 MG 24 hr tablet TAKE 1 TABLET (50 MG TOTAL) BY MOUTH DAILY. 03/16/15   Burnell Blanks, MD  Multiple Vitamins-Minerals (MULTIVITAMIN WITH MINERALS) tablet Take 1 tablet by mouth daily.    Historical Provider, MD  nitroGLYCERIN (NITROSTAT) 0.4 MG SL tablet Place 1 tablet (0.4 mg total) under the tongue as directed. 09/04/11   Hillary Bow, MD   Meds Ordered and Administered this Visit  Medications - No data to display  BP 131/80 mmHg  Pulse 81  Temp(Src) 98.1 F (36.7 C) (Oral)  Resp 12  SpO2 96% No data found.   Physical Exam  Constitutional: He is oriented to person, place, and time. He appears well-developed and well-nourished. No distress.  HENT:  Right Ear: External ear normal.  Left Ear: External ear normal.  Mouth/Throat: Oropharynx is clear and moist.  Neck: Normal range of motion. Neck supple.  Cardiovascular: Normal rate, normal heart sounds and intact distal pulses.   Pulmonary/Chest: Accessory muscle usage present. He has decreased breath sounds. He has rhonchi.  Lymphadenopathy:    He has no cervical adenopathy.  Neurological: He is alert and oriented to  person, place, and time.  Skin: Skin is warm and dry.  Nursing note and vitals reviewed.   ED Course  Procedures (including critical care time)  Labs Review Labs Reviewed - No data to display  Imaging Review No results found. X-rays reviewed and report per radiologist.   Visual Acuity Review  Right Eye Distance:   Left Eye Distance:   Bilateral Distance:    Right Eye Near:   Left Eye Near:    Bilateral Near:         MDM  No diagnosis found.  Meds ordered this encounter  Medications  . aspirin 81 MG tablet    Sig: Take 81 mg by mouth daily.  . chlorpheniramine-HYDROcodone (TUSSIONEX PENNKINETIC ER) 10-8 MG/5ML SUER    Sig: Take 5 mLs by mouth every 12 (twelve) hours as needed for cough.    Dispense:  140 mL    Refill:  1      Billy [name], MD 04/21/15 1423

## 2015-09-03 ENCOUNTER — Other Ambulatory Visit: Payer: Self-pay | Admitting: Cardiovascular Disease

## 2015-09-17 ENCOUNTER — Telehealth: Payer: Self-pay | Admitting: Cardiovascular Disease

## 2015-09-17 NOTE — Telephone Encounter (Signed)
New message       Request for surgical clearance:  1. What type of surgery is being performed? Removal of skin lesion from ear  When is this surgery scheduled? 09-28-15 Are there any medications that need to be held prior to surgery and how long? Hold plavix and aspirin? Name of physician performing surgery?  What is your office phone and fax number?  Fax [phone]

## 2015-09-17 NOTE — Telephone Encounter (Signed)
No answer or voicemail on extension. I paged number listed and spoke with Dr. Raelyn Mora.  Surgery can be done without holding ASA 81 mg but surgeon is asking if OK for pt to hold Plavix prior to procedure. Fax number confirmed

## 2015-09-20 ENCOUNTER — Encounter: Payer: Self-pay | Admitting: Cardiovascular Disease

## 2015-09-20 NOTE — Telephone Encounter (Signed)
Letter written thanks

## 2015-09-20 NOTE — Telephone Encounter (Signed)
Letter faxed.

## 2015-11-29 ENCOUNTER — Other Ambulatory Visit: Payer: Self-pay | Admitting: Cardiovascular Disease

## 2016-01-17 ENCOUNTER — Other Ambulatory Visit: Payer: Self-pay | Admitting: *Deleted

## 2016-01-17 MED ORDER — ATORVASTATIN CALCIUM 40 MG PO TABS: 40.0000 mg | ORAL_TABLET | Freq: Every day | ORAL | 0 refills | Status: DC

## 2016-01-17 MED ORDER — LOSARTAN POTASSIUM 25 MG PO TABS: 25.0000 mg | ORAL_TABLET | Freq: Every day | ORAL | 0 refills | Status: DC

## 2016-03-06 ENCOUNTER — Ambulatory Visit (INDEPENDENT_AMBULATORY_CARE_PROVIDER_SITE_OTHER): Payer: Medicare Other | Admitting: Cardiovascular Disease

## 2016-03-06 VITALS — BP 128/76 | HR 90 | Ht 68.0 in | Wt 148.0 lb

## 2016-03-06 DIAGNOSIS — I2510 Atherosclerotic heart disease of native coronary artery without angina pectoris: Secondary | ICD-10-CM

## 2016-03-06 DIAGNOSIS — E7800 Pure hypercholesterolemia, unspecified: Secondary | ICD-10-CM

## 2016-03-06 DIAGNOSIS — I10 Essential (primary) hypertension: Secondary | ICD-10-CM

## 2016-03-06 MED ORDER — CLOPIDOGREL BISULFATE 75 MG PO TABS: 75.0000 mg | ORAL_TABLET | Freq: Every day | ORAL | 3 refills | Status: DC

## 2016-03-06 MED ORDER — ATORVASTATIN CALCIUM 40 MG PO TABS: 40.0000 mg | ORAL_TABLET | Freq: Every day | ORAL | 3 refills | Status: DC

## 2016-03-06 MED ORDER — LOSARTAN POTASSIUM 25 MG PO TABS: 25.0000 mg | ORAL_TABLET | Freq: Every day | ORAL | 3 refills | Status: DC

## 2016-03-06 MED ORDER — METOPROLOL SUCCINATE ER 50 MG PO TB24: ORAL_TABLET | ORAL | 3 refills | Status: DC

## 2016-03-06 NOTE — Patient Instructions (Signed)

## 2016-03-06 NOTE — Progress Notes (Signed)
Chief Complaint  Patient presents with  . Annual Exam    History of Present Illness: 76 yo male with history of CAD, HTN, HLD and COPD who is here today for cardiac follow up. He has been followed in the past by Dr. Lia Foyer. 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 A 2.75 x 32 Taxus DES was placed in the mid RCA. A 2.5 x 8 mm Cypher DES was placed in OM1. A 3.0 x 18 mm Taxus DES was placed in the proximal Circumflex. He has had no cardiac catheterization since 2005. Exercise stress test July 2016 at the [facility] and the patient tells me there was no ischemia.    He is here today for follow up. He tells me that he has had no chest pain, SOB, palpitations, near syncope or syncope. He occasionally smokes. He has no change in bruising on arms.   [facility]  Past Medical History:  Diagnosis Date  . CAD (coronary artery disease)   . Dyspnea on exertion   . GERD (gastroesophageal reflux disease)   . Hyperlipidemia   . Rectal bleeding   . Tobacco abuse   . Unspecified essential hypertension     Past Surgical History:  Procedure Laterality Date  . CORONARY ANGIOPLASTY WITH STENT PLACEMENT     Drug eluting stent placed to the first obtuse marginal with a 2.58 mm cypher drug eluting stent. Circumflex was treated with 3.0 x 18 mm TAXUS drug-eluting stent on 07/17/03  . EYE SURGERY  1971   bilateral  . PTCA     And drug-eluting stent to the RCA  . RECTAL SURGERY  2008   dr. Rosana Hoes did this surgery    Current Outpatient Prescriptions  Medication Sig Dispense Refill  . aspirin 81 MG tablet Take 81 mg by mouth daily.    Marland Kitchen atorvastatin (LIPITOR) 40 MG tablet Take 1 tablet (40 mg total) by mouth at bedtime. 90 tablet 3  . clopidogrel (PLAVIX) 75 MG tablet Take 1 tablet (75 mg total) by mouth daily. 3 90 tablet 3  . losartan (COZAAR) 25 MG tablet Take 1 tablet (25 mg total) by mouth daily. 90 tablet 3  . metoprolol succinate (TOPROL-XL) 50 MG 24 hr  tablet TAKE 1 TABLET (50 MG TOTAL) BY MOUTH DAILY. 90 tablet 3  . Multiple Vitamins-Minerals (MULTIVITAMIN WITH MINERALS) tablet Take 1 tablet by mouth daily.    . nitroGLYCERIN (NITROSTAT) 0.4 MG SL tablet Place 1 tablet (0.4 mg total) under the tongue as directed. 25 tablet 2  . PROAIR HFA 108 (90 Base) MCG/ACT inhaler Inhale 1 puff into the lungs every 4 (four) hours as needed for shortness of breath.    . SPIRIVA HANDIHALER 18 MCG inhalation capsule Place 1 puff into inhaler and inhale daily.     No current facility-administered medications for this visit.     No Known Allergies  Social History   Social History  . Marital status: Married    Spouse name: N/A  . Number of children: N/A  . Years of education: N/A   Occupational History  . 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 Smoking status: Current Every Day Smoker    Packs/day: 1.00  . Smokeless tobacco: Not on file     Comment: Smoked for about 40 years, smokes cigarette every once in a while, maybe a pack a year.  Marland Kitchen  Alcohol use Yes  . Drug use: No  . Sexual activity: Not on file   Other Topics Concern  . Not on file   Social History Narrative   Lives in [place] with his wife   [place] veteran   Exercises 3 times a week at the [employer]    Family History  Problem Relation Age of Onset  . Cancer Mother     cervical  . Lung disease Father     Black lung    Review of Systems:  As stated in the HPI and otherwise negative.   BP 128/76   Pulse 90   Ht 5\' 8"  (1.727 m)   Wt 148 lb (67.1 kg)   BMI 22.50 kg/m   Physical Examination: General: Well developed, well nourished, NAD  HEENT: OP clear, mucus membranes moist  SKIN: warm, dry. No rashes. Neuro: No focal deficits  Musculoskeletal: Muscle strength 5/5 all ext  Psychiatric: Mood and affect normal  Neck: No JVD, no carotid bruits, no thyromegaly, no lymphadenopathy.  Lungs:Clear bilaterally, no  wheezes, rhonci, crackles Cardiovascular: Regular rate and rhythm. No murmurs, gallops or rubs. Abdomen:Soft. Bowel sounds present. Non-tender.  Extremities: No lower extremity edema. Pulses are 2 + in the bilateral DP/PT.  Echo 12/03/12: Left ventricle: The cavity size was normal. Wall thickness was normal. Systolic function was normal. The estimated ejection fraction was in the range of 50% to 55%. Wall motion was normal; there were no regional wall motion abnormalities. Doppler parameters are consistent with abnormal left ventricular relaxation (grade 1 diastolic dysfunction). - Mitral valve: Calcified annulus.  EKG:  EKG is ordered today. The ekg ordered today demonstrates NSR, rate 90 bpm.   Recent Labs: No results found for requested labs within last 8760 hours.   Lipid Panel    Component Value Date/Time   CHOL 142 01/05/2014 0919   TRIG 150.0 (H) 01/05/2014 0919   HDL 32.80 (L) 01/05/2014 0919   CHOLHDL 4 01/05/2014 0919   VLDL 30.0 01/05/2014 0919   LDLCALC 79 01/05/2014 0919     Wt Readings from Last 3 Encounters:  03/06/16 148 lb (67.1 kg)  01/15/15 139 lb 3.2 oz (63.1 kg)  06/23/14 143 lb (64.9 kg)     Other studies Reviewed: Additional studies/ records that were reviewed today include: . Review of the above records demonstrates:    Assessment and Plan:   1. CAD: He is doing well. No recent angina. He is on Plavix (First generation DES in place). Will continue Plavix only for now. Continue statin and beta blocker. No ischemia on exercise stress test July 2016 at the [place]. LV function normal on echo 12/03/12.  2. HYPERTENSION: Controlled. No changes today.      3. TOBACCO ABUSE:  He stopped smoking .    4. HYPERLIPIDEMIA: Lipids at goal at the [place]. Continue statin. LFTs ok November 2015.   5. Dry cough: Resolved when changed Ramipril to Cozaar.    Current medicines are reviewed at length with the patient today.  The patient does not have concerns  regarding medicines.  The following changes have been made:  no change  Labs/ tests ordered today include:    Orders Placed This Encounter  Procedures  . EKG 12-Lead    Disposition:   FU with me in 6 months  Signed, Lauree Chandler, MD 03/06/2016 4:29 PM    [address], [place], [place]  [address] Phone: [phone]; Fax: [phone]

## 2016-03-13 ENCOUNTER — Ambulatory Visit: Payer: Medicare Other | Admitting: Cardiovascular Disease

## 2016-07-14 ENCOUNTER — Telehealth: Payer: Self-pay | Admitting: Cardiovascular Disease

## 2016-07-14 ENCOUNTER — Other Ambulatory Visit: Payer: Self-pay | Admitting: Cardiovascular Disease

## 2016-07-14 NOTE — Telephone Encounter (Signed)
Called pt back concerning his refill of Atorvastatin 40 mg tablet. I informed the pt that he has refills left at his pharmacy and pt stated that he email his pharmacy and that the pharmacy did not have any refills. I advised the pt to call his pharmacy to request a refill and that he has a year supply. The pt stated that it was not his job to call his pharmacy to request a refill, the pt was very rude and hung up the phone, after I offered to call his pharmacy for him. I called pt's pharmacy and requested a refill for the pt and informed them that the pt stated that he sent a email requesting a refill. Pharmacy stated that they had not received that request. I called the pt back and left a message informing pt that his refill was on the way and that he needed to call his pharmacy to request another refill when it is time and to call this number, [phone] and the confirmation number for the refill that the pharmacy is sending out is confirmation# [id number] and if he has any other problems, questions or concerns to call our office back.

## 2016-07-14 NOTE — Telephone Encounter (Signed)
Called pt back concerning his refill of Atorvastatin 40 mg tablet. I informed the pt that he has refills left at his pharmacy and pt stated that he email his pharmacy and that the pharmacy did not have any refills. I advised the pt to call his pharmacy to request a refill and that he has a year supply. The pt stated that it was not his job to call his pharmacy to request a refill, the pt was very rude and hung up the phone, after I offered to call his pharmacy for him. I called pt's pharmacy and requested a refill for the pt and informed them that the pt stated that he sent a email requesting a refill. Pharmacy stated that they had not received that request. I called the pt back and left a message informing pt that his refill was on the way and that he needed to call his pharmacy to request another refill when it is time and to call this number, [phone] and the confirmation number for the refill that the pharmacy is sending out is confirmation# [id number] and if he has any other problems, questions or concerns to call our office back.

## 2016-07-14 NOTE — Telephone Encounter (Signed)
New message    *STAT* If patient is at the pharmacy, call can be transferred to refill team.   1. Which medications need to be refilled? (please list name of each medication and dose if known) atorvastatin (LIPITOR) 40 MG tablet metoprolol succinate (TOPROL-XL) 50 MG 24 hr tablet  2. Which pharmacy/location (including street and city if local pharmacy) is medication to be sent to? [facility]  3. Do they need a 30 day or 90 day supply? 90 day

## 2016-07-25 ENCOUNTER — Telehealth: Payer: Self-pay | Admitting: Cardiovascular Disease

## 2016-07-25 NOTE — Telephone Encounter (Signed)
Called pt and pt's pharmacy to inform pt that he has refills left at his pharmacy and if he has any other problems, questions or concerns to call our office. Pt verbalized understanding.

## 2016-07-25 NOTE — Telephone Encounter (Signed)
°*  STAT* If patient is at the pharmacy, call can be transferred to refill team.   1. Which medications need to be refilled? (please list name of each medication and dose if known)new prescription for Atorvastatin  2. Which pharmacy/location (including street and city if local pharmacy) is medication to be sent to?[facility]  3. Do they need a 30 day or 90 day supply? 90 and refills

## 2016-07-25 NOTE — Telephone Encounter (Signed)
Please close encounter.. Thanks! °

## 2016-08-29 NOTE — Telephone Encounter (Signed)
I could not close this encounter,says it is locked

## 2016-09-21 ENCOUNTER — Encounter (INDEPENDENT_AMBULATORY_CARE_PROVIDER_SITE_OTHER): Payer: Self-pay

## 2016-09-21 ENCOUNTER — Ambulatory Visit (INDEPENDENT_AMBULATORY_CARE_PROVIDER_SITE_OTHER): Payer: Medicare Other | Admitting: Cardiovascular Disease

## 2016-09-21 ENCOUNTER — Encounter: Payer: Self-pay | Admitting: Cardiovascular Disease

## 2016-09-21 VITALS — BP 120/70 | HR 67 | Ht 68.0 in | Wt 146.8 lb

## 2016-09-21 DIAGNOSIS — I2510 Atherosclerotic heart disease of native coronary artery without angina pectoris: Secondary | ICD-10-CM

## 2016-09-21 DIAGNOSIS — E7800 Pure hypercholesterolemia, unspecified: Secondary | ICD-10-CM | POA: Diagnosis not present

## 2016-09-21 DIAGNOSIS — Z720 Tobacco use: Secondary | ICD-10-CM

## 2016-09-21 DIAGNOSIS — I10 Essential (primary) hypertension: Secondary | ICD-10-CM

## 2016-09-21 NOTE — Progress Notes (Signed)
Chief Complaint  Patient presents with  . Follow-up    History of Present Illness: 76 yo male with history of CAD, HTN, HLD and COPD who is here today for cardiac follow up. 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 A 2.75 x 32 Taxus DES was placed in the mid RCA. A 2.5 x 8 mm Cypher DES was placed in OM1. A 3.0 x 18 mm Taxus DES was placed in the proximal Circumflex. He has had no cardiac catheterization since 2005. Exercise stress test July 2016 at the [facility] without ischemia.   He is here today for follow up. The patient denies any chest pain, palpitations, lower extremity edema, orthopnea, PND, dizziness, near syncope or syncope. He has dyspnea with exertion and is now on inhalers for COPD. His breathing has improved with use of inhalers.    [facility] [place]   Past Medical History:  Diagnosis Date  . CAD (coronary artery disease)   . Dyspnea on exertion   . GERD (gastroesophageal reflux disease)   . Hyperlipidemia   . Rectal bleeding   . Tobacco abuse   . Unspecified essential hypertension     Past Surgical History:  Procedure Laterality Date  . CORONARY ANGIOPLASTY WITH STENT PLACEMENT     Drug eluting stent placed to the first obtuse marginal with a 2.58 mm cypher drug eluting stent. Circumflex was treated with 3.0 x 18 mm TAXUS drug-eluting stent on 07/17/03  . EYE SURGERY  1971   bilateral  . PTCA     And drug-eluting stent to the RCA  . RECTAL SURGERY  2008   dr. Rosana Hoes did this surgery    Current Outpatient Prescriptions  Medication Sig Dispense Refill  . aspirin 81 MG tablet Take 81 mg by mouth daily.    Marland Kitchen atorvastatin (LIPITOR) 40 MG tablet Take 1 tablet (40 mg total) by mouth at bedtime. 90 tablet 3  . clopidogrel (PLAVIX) 75 MG tablet Take 1 tablet (75 mg total) by mouth daily. 3 90 tablet 3  . losartan (COZAAR) 25 MG tablet Take 1 tablet (25 mg total) by mouth daily. 90 tablet 3  . metoprolol succinate (TOPROL-XL) 50 MG  24 hr tablet TAKE 1 TABLET (50 MG TOTAL) BY MOUTH DAILY. 90 tablet 3  . Multiple Vitamins-Minerals (MULTIVITAMIN WITH MINERALS) tablet Take 1 tablet by mouth daily.    . nitroGLYCERIN (NITROSTAT) 0.4 MG SL tablet Place 1 tablet (0.4 mg total) under the tongue as directed. 25 tablet 2  . PROAIR HFA 108 (90 Base) MCG/ACT inhaler Inhale 1 puff into the lungs every 4 (four) hours as needed for shortness of breath.    . SPIRIVA HANDIHALER 18 MCG inhalation capsule Place 1 puff into inhaler and inhale daily.    . SYMBICORT 80-4.5 MCG/ACT inhaler Inhale 2 puffs into the lungs 2 (two) times daily.     No current facility-administered medications for this visit.     No Known Allergies  Social History   Social History  . Marital status: Married    Spouse name: N/A  . Number of children: N/A  . Years of education: N/A   Occupational History  . 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 Smoking status: Current Every Day Smoker    Packs/day: 1.00  . Smokeless tobacco: Never Used     Comment: Smoked for about 40 years, smokes cigarette every  once in a while, maybe a pack a year.  . Alcohol use Yes  . Drug use: No  . Sexual activity: Not on file   Other Topics Concern  . Not on file   Social History Narrative   Lives in [place] with his wife   [place] veteran   Exercises 3 times a week at the [employer]    Family History  Problem Relation Age of Onset  . Cancer Mother        cervical  . Lung disease Father        Black lung    Review of Systems:  As stated in the HPI and otherwise negative.   BP 120/70   Pulse 67   Ht 5\' 8"  (1.727 m)   Wt 146 lb 12.8 oz (66.6 kg)   SpO2 94%   BMI 22.32 kg/m   Physical Examination:  General: Well developed, well nourished, NAD  HEENT: OP clear, mucus membranes moist  SKIN: warm, dry. No rashes. Neuro: No focal deficits  Musculoskeletal: Muscle strength 5/5 all ext  Psychiatric: Mood  and affect normal  Neck: No JVD, no carotid bruits, no thyromegaly, no lymphadenopathy.  Lungs:Clear bilaterally, no wheezes, rhonci, crackles Cardiovascular: Regular rate and rhythm. No murmurs, gallops or rubs. Abdomen:Soft. Bowel sounds present. Non-tender.  Extremities: No lower extremity edema. Pulses are 2 + in the bilateral DP/PT.  Echo 12/03/12: Left ventricle: The cavity size was normal. Wall thickness was normal. Systolic function was normal. The estimated ejection fraction was in the range of 50% to 55%. Wall motion was normal; there were no regional wall motion abnormalities. Doppler parameters are consistent with abnormal left ventricular relaxation (grade 1 diastolic dysfunction). - Mitral valve: Calcified annulus.  EKG:  EKG is not ordered today. The ekg ordered today demonstrates   Recent Labs: No results found for requested labs within last 8760 hours.   Lipid Panel    Component Value Date/Time   CHOL 142 01/05/2014 0919   TRIG 150.0 (H) 01/05/2014 0919   HDL 32.80 (L) 01/05/2014 0919   CHOLHDL 4 01/05/2014 0919   VLDL 30.0 01/05/2014 0919   LDLCALC 79 01/05/2014 0919     Wt Readings from Last 3 Encounters:  09/21/16 146 lb 12.8 oz (66.6 kg)  03/06/16 148 lb (67.1 kg)  01/15/15 139 lb 3.2 oz (63.1 kg)     Other studies Reviewed: Additional studies/ records that were reviewed today include: . Review of the above records demonstrates:    Assessment and Plan:   1. CAD without angina: He has no chest pain suggestive of angina. He is on ASA/Plavix/statin and a beta blocker. He has a first generation DES so will need to continue Plavix. LV function normal by echo in 2014.   2. HYPERTENSION: BP controlled. No changes.      3. TOBACCO ABUSE: He quit smoking completely.   4. HYPERLIPIDEMIA: Lipids followed at the [facility] and controlled per pt. Continue statin.       Current medicines are reviewed at length with the patient today.  The patient does not have  concerns regarding medicines.  The following changes have been made:  no change  Labs/ tests ordered today include:    No orders of the defined types were placed in this encounter.   Disposition:   FU with me in 12 months  Signed, Lauree Chandler, MD 09/21/2016 11:15 AM    [address], [place], [place]  [address] Phone: [phone]; Fax: [phone]

## 2016-09-21 NOTE — Patient Instructions (Signed)

## 2017-02-28 ENCOUNTER — Other Ambulatory Visit: Payer: Self-pay | Admitting: Cardiovascular Disease

## 2017-03-30 ENCOUNTER — Telehealth: Payer: Self-pay | Admitting: Cardiovascular Disease

## 2017-03-30 ENCOUNTER — Other Ambulatory Visit: Payer: Self-pay | Admitting: Cardiovascular Disease

## 2017-03-30 MED ORDER — NITROGLYCERIN 0.4 MG SL SUBL: 0.4000 mg | SUBLINGUAL_TABLET | SUBLINGUAL | 1 refills | Status: DC

## 2017-03-30 MED ORDER — LOSARTAN POTASSIUM 25 MG PO TABS: 25.0000 mg | ORAL_TABLET | Freq: Every day | ORAL | 1 refills | Status: DC

## 2017-03-30 MED ORDER — CLOPIDOGREL BISULFATE 75 MG PO TABS: 75.0000 mg | ORAL_TABLET | Freq: Every day | ORAL | 1 refills | Status: DC

## 2017-03-30 MED ORDER — ATORVASTATIN CALCIUM 40 MG PO TABS: 40.0000 mg | ORAL_TABLET | Freq: Every day | ORAL | 1 refills | Status: DC

## 2017-03-30 NOTE — Telephone Encounter (Signed)
Pt's medications were sent to pt's pharmacy as requested. Confirmation received.  

## 2017-03-30 NOTE — Telephone Encounter (Signed)
Pt's medication was sent to pt's pharmacy as requested. Confirmation received.  °

## 2017-03-30 NOTE — Addendum Note (Signed)
Addended by: Derl Barrow on: 03/30/2017 02:33 PM   Modules accepted: Orders

## 2017-03-30 NOTE — Telephone Encounter (Signed)
°*  STAT* If patient is at the pharmacy, call can be transferred to refill team.   1. Which medications need to be refilled? (please list name of each medication and dose if known) Atorvastatin 40 mg, Clopidogrel 75 mg, Losartan 25 mg, Metoprolol Succinate 50 mg and Nitroglycerin 0.4mg   2. Which pharmacy/location (including street and city if local pharmacy) is medication to be sent to?[facility], [address] AT [facility]  3. Do they need a 30 day or 90 day supply? [place]

## 2017-06-01 ENCOUNTER — Other Ambulatory Visit: Payer: Self-pay | Admitting: Cardiovascular Disease

## 2017-06-01 MED ORDER — METOPROLOL SUCCINATE ER 50 MG PO TB24: ORAL_TABLET | ORAL | 0 refills | Status: DC

## 2017-06-01 NOTE — Telephone Encounter (Signed)
New message     *STAT* If patient is at the pharmacy, call can be transferred to refill team.   1. Which medications need to be refilled? (please list name of each medication and dose if known) metoprolol succinate (TOPROL-XL) 50 MG 24 hr tablet  2. Which pharmacy/location (including street and city if local pharmacy) is medication to be sent to? [facility], [place] AT [facility]  3. Do they need a 30 day or 90 day supply?[place]

## 2017-06-01 NOTE — Telephone Encounter (Signed)
Pt's medication was sent to pt's pharmacy as requested. Confirmation received.  °

## 2017-06-13 ENCOUNTER — Encounter: Payer: Self-pay | Admitting: Physician Assistant

## 2017-06-22 ENCOUNTER — Ambulatory Visit: Payer: Medicare Other | Admitting: Cardiovascular Disease

## 2017-07-04 ENCOUNTER — Ambulatory Visit (INDEPENDENT_AMBULATORY_CARE_PROVIDER_SITE_OTHER): Payer: Medicare Other | Admitting: Physician Assistant

## 2017-07-04 ENCOUNTER — Encounter: Payer: Self-pay | Admitting: Physician Assistant

## 2017-07-04 VITALS — BP 130/72 | HR 77 | Ht 70.0 in | Wt 147.0 lb

## 2017-07-04 DIAGNOSIS — I2510 Atherosclerotic heart disease of native coronary artery without angina pectoris: Secondary | ICD-10-CM | POA: Diagnosis not present

## 2017-07-04 DIAGNOSIS — I10 Essential (primary) hypertension: Secondary | ICD-10-CM | POA: Diagnosis not present

## 2017-07-04 DIAGNOSIS — E782 Mixed hyperlipidemia: Secondary | ICD-10-CM

## 2017-07-04 DIAGNOSIS — F17200 Nicotine dependence, unspecified, uncomplicated: Secondary | ICD-10-CM

## 2017-07-04 MED ORDER — NITROGLYCERIN 0.4 MG SL SUBL: 0.4000 mg | SUBLINGUAL_TABLET | SUBLINGUAL | 3 refills | Status: DC

## 2017-07-04 MED ORDER — CLOPIDOGREL BISULFATE 75 MG PO TABS: 75.0000 mg | ORAL_TABLET | Freq: Every day | ORAL | 3 refills | Status: DC

## 2017-07-04 MED ORDER — METOPROLOL SUCCINATE ER 50 MG PO TB24: ORAL_TABLET | ORAL | 3 refills | Status: DC

## 2017-07-04 MED ORDER — LOSARTAN POTASSIUM 25 MG PO TABS: 25.0000 mg | ORAL_TABLET | Freq: Every day | ORAL | 3 refills | Status: DC

## 2017-07-04 MED ORDER — ATORVASTATIN CALCIUM 40 MG PO TABS: 40.0000 mg | ORAL_TABLET | Freq: Every day | ORAL | 3 refills | Status: DC

## 2017-07-04 NOTE — Patient Instructions (Addendum)
Medication Instructions:  Your physician recommends that you continue on your current medications as directed. Please refer to the Current Medication list given to you today.   Labwork: None ordered  Testing/Procedures: None ordered  Follow-Up: Your physician wants you to follow-up in: 1 YEAR WITH DR. MCALHANY   You will receive a reminder letter in the mail two months in advance. If you don't receive a letter, please call our office to schedule the follow-up appointment.   Any Other Special Instructions Will Be Listed Below (If Applicable).     If you need a refill on your cardiac medications before your next appointment, please call your pharmacy.   

## 2017-07-04 NOTE — Progress Notes (Signed)
Cardiology Office Note    Date:  07/04/2017   ID:  Austin [name], DOB 01-08-1941, MRN [id number]  PCP:  [facility] Dr. Warrick Parisian Cardiologist: Lauree Chandler, MD  Chief Complaint  Patient presents with  . Follow-up    History of Present Illness:  Austin [name] is a 77 y.o. 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  Also had Cypher DES in the OM1 and Taxus DES in the proximal circumflex.  Stress test in 2016 at the [facility] without ischemia.  Also has hypertension, HLD, COPD.  Patient last saw Dr. Angelena Form 09/21/2016 at which time he was doing well without angina.  Because he has a first generation DES he recommend he stay on Plavix.  2D echo in 2014 showed normal LV function.  Patient comes in for f/u. Walks 3-4 miles throughout the day. No regular exercise. Stays busy all day. Has chronic dyspnea on exertion unchanged. Labs just checked at [facility]. and he was told they were normal.  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  Denies chest pain, palpitations, dyspnea at rest, dizziness or presyncope.  Past Medical History:  Diagnosis Date  . CAD (coronary artery disease)   . Dyspnea on exertion   . GERD (gastroesophageal reflux disease)   . Hyperlipidemia   . Rectal bleeding   . Tobacco abuse   . Unspecified essential hypertension     Past Surgical History:  Procedure Laterality Date  . CORONARY ANGIOPLASTY WITH STENT PLACEMENT     Drug eluting stent placed to the first obtuse marginal with a 2.58 mm cypher drug eluting stent. Circumflex was treated with 3.0 x 18 mm TAXUS drug-eluting stent on 07/17/03  . EYE SURGERY  1971   bilateral  . PTCA     And drug-eluting stent to the RCA  . RECTAL SURGERY  2008   dr. Rosana Hoes did this surgery    Current Medications: Current Meds  Medication Sig  . atorvastatin (LIPITOR) 40 MG tablet Take 1 tablet (40 mg total) by mouth at bedtime.  .  clopidogrel (PLAVIX) 75 MG tablet Take 1 tablet (75 mg total) by mouth daily.  Marland Kitchen losartan (COZAAR) 25 MG tablet Take 1 tablet (25 mg total) by mouth daily.  . metoprolol succinate (TOPROL-XL) 50 MG 24 hr tablet TAKE 1 TABLET BY MOUTH DAILY.  . Multiple Vitamins-Minerals (MULTIVITAMIN WITH MINERALS) tablet Take 1 tablet by mouth daily.  . nitroGLYCERIN (NITROSTAT) 0.4 MG SL tablet Place 1 tablet (0.4 mg total) under the tongue as directed.  Marland Kitchen PROAIR HFA 108 (90 Base) MCG/ACT inhaler Inhale 1 puff into the lungs every 4 (four) hours as needed for shortness of breath.  . SPIRIVA HANDIHALER 18 MCG inhalation capsule Place 1 puff into inhaler and inhale daily.  . SYMBICORT 80-4.5 MCG/ACT inhaler Inhale 2 puffs into the lungs 2 (two) times daily.  . [DISCONTINUED] atorvastatin (LIPITOR) 40 MG tablet Take 1 tablet (40 mg total) by mouth at bedtime. Please make yearly appt with Dr. Angelena Form for August. 1st attempt  . [DISCONTINUED] clopidogrel (PLAVIX) 75 MG tablet Take 1 tablet (75 mg total) by mouth daily. Please make yearly appt with Dr. Angelena Form for August. 1st attempt  . [DISCONTINUED] losartan (COZAAR) 25 MG tablet Take 1 tablet (25 mg total) by mouth daily. Please make yearly appt with Dr. Angelena Form for August. 1st attempt  . [DISCONTINUED] metoprolol  succinate (TOPROL-XL) 50 MG 24 hr tablet TAKE 1 TABLET BY MOUTH DAILY. Please keep upcoming appt before anymore refills. Thank you  . [DISCONTINUED] nitroGLYCERIN (NITROSTAT) 0.4 MG SL tablet Place 1 tablet (0.4 mg total) under the tongue as directed.     Allergies:   Patient has no known allergies.   Social History   Socioeconomic History  . Marital status: Married    Spouse name: Not on file  . Number of children: Not on file  . Years of education: Not on file  . Highest education level: Not on file  Occupational History  . Occupation: Retired Magazine features editor: RETIRED  . Occupation: Runs a cardiac rehab  Social Needs  . Financial  resource strain: Not on file  . Food insecurity:    Worry: Not on file    Inability: Not on file  . Transportation needs:    Medical: Not on file    Non-medical: Not on file  Tobacco Use  . Smoking status: Former Smoker    Packs/day: 1.00  . Smokeless tobacco: Never Used  . Tobacco comment: Smoked for about 40 years, smokes cigarette every once in a while, maybe a pack a year.  Substance and Sexual Activity  . Alcohol use: Yes  . Drug use: No  . Sexual activity: Not on file  Lifestyle  . Physical activity:    Days per week: Not on file    Minutes per session: Not on file  . Stress: Not on file  Relationships  . Social connections:    Talks on phone: Not on file    Gets together: Not on file    Attends religious service: Not on file    Active member of club or organization: Not on file    Attends meetings of clubs or organizations: Not on file    Relationship status: Not on file  Other Topics Concern  . Not on file  Social History Narrative   Lives in [place] with his wife   [place] veteran   Exercises 3 times a week at the [employer]     Family History:  The patient's family history includes Cancer in his mother; Lung disease in his father.   ROS:   Please see the history of present illness.    Review of Systems  Constitution: Negative.  HENT: Negative.   Cardiovascular: Positive for dyspnea on exertion.  Respiratory: Negative.   Endocrine: Negative.   Hematologic/Lymphatic: Negative.   Musculoskeletal: Negative.   Gastrointestinal: Negative.   Genitourinary: Negative.   Neurological: Negative.    All other systems reviewed and are negative.   PHYSICAL EXAM:   VS:  BP 130/72   Pulse 77   Ht 5\' 10"  (1.778 m)   Wt 147 lb (66.7 kg)   BMI 21.09 kg/m   Physical Exam  GEN: Thin, in no acute distress  Neck: no JVD, carotid bruits, or masses Cardiac:RRR; positive S4, no murmur Respiratory:  Decreased breath sounds throughout GI: soft, nontender,  nondistended, + BS Ext: without cyanosis, clubbing, or edema, Good distal pulses bilaterally Neuro:  Alert and Oriented x 3, Psych: euthymic mood, full affect  Wt Readings from Last 3 Encounters:  07/04/17 147 lb (66.7 kg)  09/21/16 146 lb 12.8 oz (66.6 kg)  03/06/16 148 lb (67.1 kg)      Studies/Labs Reviewed:   EKG:  EKG is ordered today.  The ekg ordered today demonstrates normal sinus rhythm, no acute change  Recent Labs:  No results found for requested labs within last 8760 hours.   Lipid Panel    Component Value Date/Time   CHOL 142 01/05/2014 0919   TRIG 150.0 (H) 01/05/2014 0919   HDL 32.80 (L) 01/05/2014 0919   CHOLHDL 4 01/05/2014 0919   VLDL 30.0 01/05/2014 0919   LDLCALC 79 01/05/2014 0919    Additional studies/ records that were reviewed today include:  2D echo 2014Study Conclusions  - Left ventricle: The cavity size was normal. Wall thickness   was normal. Systolic function was normal. The estimated   ejection fraction was in the range of 50% to 55%. Wall   motion was normal; there were no regional wall motion   abnormalities. Doppler parameters are consistent with   abnormal left ventricular relaxation (grade 1 diastolic   dysfunction). - Mitral valve: Calcified annulus. Transthoracic echocardiography.  M-mode, complete 2D, spectral Doppler, and color Doppler.  Height:  Height: 180.3cm. Height: 71in.  Weight:  Weight: 67.1kg. Weight: 147.7lb.  Body mass index:  BMI: 20.6kg/m^2.  Body surface area:    BSA: 1.82m^2.  Blood pressure:     140/78.  Patient status:  Outpatient.  Location:  [facility]     ASSESSMENT:    1. Atherosclerosis of native coronary artery of native heart without angina pectoris   2. Essential hypertension   3. Mixed hyperlipidemia   4. TOBACCO ABUSE      PLAN:  In order of problems listed above:  CAD status post status post MI 07/2003 treated with Taxus DES to the mid RCA, Cypher DES to the OM1, DES to the proximal  circumflex.  Dr. Angelena Form recommends Plavix long-term because he has a first generation DES.  Refill medicines, continue Plavix, aspirin and Lipitor.  Obtain labs from the [facility] that were just drawn.  Essential hypertension blood pressure well controlled on losartan and Toprol  Mixed hyperlipidemia continue Lipitor  Tobacco abuse smoking 3 years ago    Medication Adjustments/Labs and Tests Ordered: Current medicines are reviewed at length with the patient today.  Concerns regarding medicines are outlined above.  Medication changes, Labs and Tests ordered today are listed in the Patient Instructions below. Patient Instructions  Medication Instructions:  Your physician recommends that you continue on your current medications as directed. Please refer to the Current Medication list given to you today.  Labwork: None ordered  Testing/Procedures: None ordered  Follow-Up: Your physician wants you to follow-up in: [place] will receive a reminder letter in the mail two months in advance. If you don't receive a letter, please call our office to schedule the follow-up appointment.  Any Other Special Instructions Will Be Listed Below (If Applicable).     If you need a refill on your cardiac medications before your next appointment, please call your pharmacy.      Sumner Boast, PA-C  07/04/2017 12:47 PM    [facility] [address], [place], [place]  [address] Phone: [phone]; Fax: [phone]

## 2017-07-23 IMAGING — DX DG CHEST 2V
2 series · 2 of 2 positions shown · non-contrast
Comparison: Single-view of the chest 11/05/2008. PA and lateral
chest 03/30/2006.

CLINICAL DATA: Cough, fever, head and chest congestion and body
aches for 4 days. Smoker. Initial encounter.

EXAM:
CHEST  2 VIEW

[chest lat]
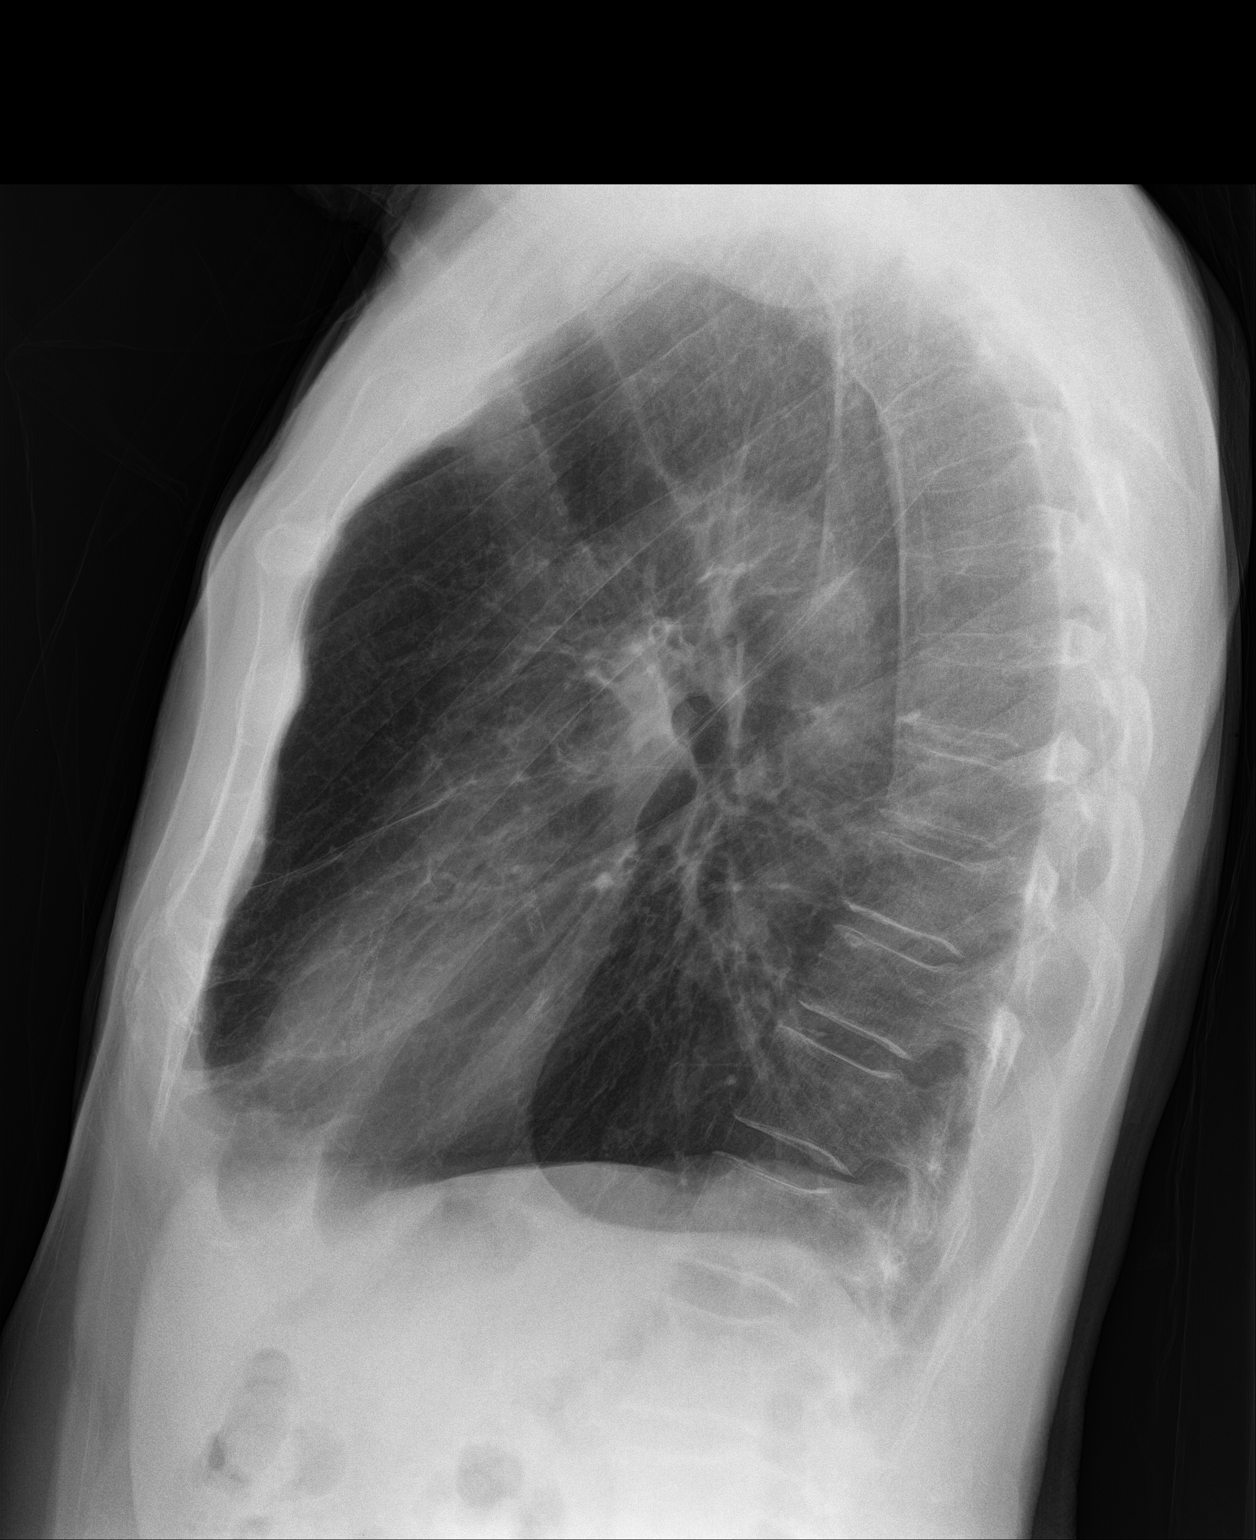

[chest pa]
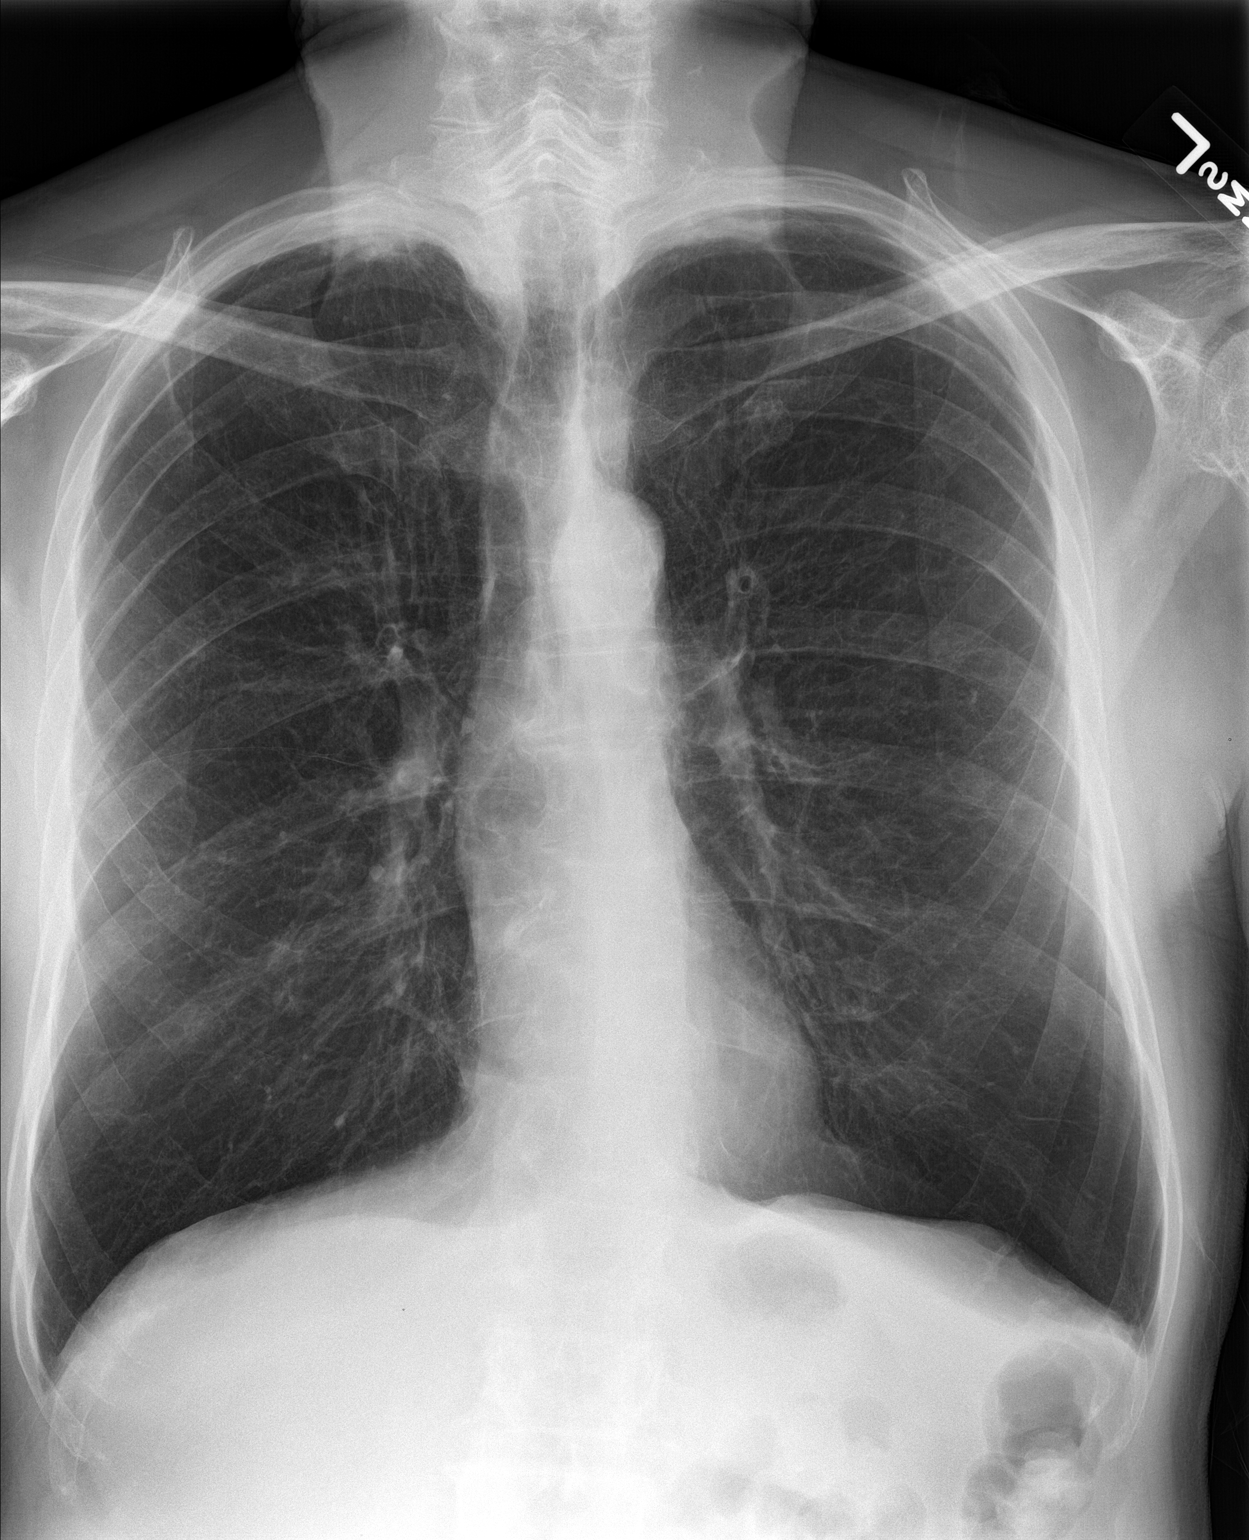

[2 of 2 positions shown; findings below may reference images not displayed]

FINDINGS: The chest is hyperexpanded with attenuation of the pulmonary
vasculature. The lungs are clear. Heart size is normal. No
pneumothorax or pleural effusion.
IMPRESSION: Emphysema without acute disease.

## 2018-04-03 ENCOUNTER — Other Ambulatory Visit: Payer: Self-pay | Admitting: *Deleted

## 2018-04-03 MED ORDER — ATORVASTATIN CALCIUM 40 MG PO TABS: 40.0000 mg | ORAL_TABLET | Freq: Every day | ORAL | 0 refills | Status: DC

## 2018-04-03 MED ORDER — CLOPIDOGREL BISULFATE 75 MG PO TABS: 75.0000 mg | ORAL_TABLET | Freq: Every day | ORAL | 0 refills | Status: DC

## 2018-04-03 MED ORDER — LOSARTAN POTASSIUM 25 MG PO TABS: 25.0000 mg | ORAL_TABLET | Freq: Every day | ORAL | 0 refills | Status: DC

## 2018-06-24 ENCOUNTER — Telehealth: Payer: Self-pay | Admitting: Cardiovascular Disease

## 2018-06-24 NOTE — Telephone Encounter (Signed)
PHONE visit on 06/27/2018     Phone Call to obtain consent    -  06/27/2018         Virtual Visit Pre-Appointment Phone Call  "(Name), I am calling you today to discuss your upcoming appointment. We are currently trying to limit exposure to the virus that causes COVID-19 by seeing patients at home rather than in the office."  1. "What is the BEST phone number to call the day of the visit?" - include this in appointment notes  2. Do you have or have access to (through a family member/friend) a smartphone with video capability that we can use for your visit?" a. If yes - list this number in appt notes as cell (if different from BEST phone #) and list the appointment type as a VIDEO visit in appointment notes b. If no - list the appointment type as a PHONE visit in appointment notes  3. Confirm consent - "In the setting of the current Covid19 crisis, you are scheduled for a (phone or video) visit with your provider on (date) at (time).  Just as we do with many in-office visits, in order for you to participate in this visit, we must obtain consent.  If you'd like, I can send this to your mychart (if signed up) or email for you to review.  Otherwise, I can obtain your verbal consent now.  All virtual visits are billed to your insurance company just like a normal visit would be.  By agreeing to a virtual visit, we'd like you to understand that the technology does not allow for your provider to perform an examination, and thus may limit your provider's ability to fully assess your condition. If your provider identifies any concerns that need to be evaluated in person, we will make arrangements to do so.  Finally, though the technology is pretty good, we cannot assure that it will always work on either your or our end, and in the setting of a video visit, we may have to convert it to a phone-only visit.  In either situation, we cannot ensure that we have a secure connection.  Are you willing  to proceed?" STAFF: Did the patient verbally acknowledge consent to telehealth visit? Document YES/NO here: YES  4. Advise patient to be prepared - "Two hours prior to your appointment, go ahead and check your blood pressure, pulse, oxygen saturation, and your weight (if you have the equipment to check those) and write them all down. When your visit starts, your provider will ask you for this information. If you have an Apple Watch or Kardia device, please plan to have heart rate information ready on the day of your appointment. Please have a pen and paper handy nearby the day of the visit as well."  5. Give patient instructions for MyChart download to smartphone OR [email or web address] as below if video visit (depending on what platform provider is using)  6. Inform patient they will receive a phone call 15 minutes prior to their appointment time (may be from unknown caller ID) so they should be prepared to answer    TELEPHONE CALL NOTE  Austin [name] has been deemed a candidate for a follow-up tele-health visit to limit community exposure during the Covid-19 pandemic. I spoke with the patient via phone to ensure availability of phone/video source, confirm preferred email & phone number, and discuss instructions and expectations.  I reminded Austin [name] to be prepared with any vital sign and/or heart rhythm  information that could potentially be obtained via home monitoring, at the time of his visit. I reminded Austin [name] to expect a phone call prior to his visit.  [name] 06/24/2018 11:04 AM   INSTRUCTIONS FOR DOWNLOADING THE MYCHART APP TO SMARTPHONE  - The patient must first make sure to have activated MyChart and know their login information - If Apple, go to [employer] and type in MyChart in the search bar and download the app. If Android, ask patient to go to [employer] and type in [place] in the search bar and download the app. The app is free but as with any other app  downloads, their phone may require them to verify saved payment information or Apple/Android password.  - The patient will need to then log into the app with their MyChart username and password, and select [facility] as their healthcare provider to link the account. When it is time for your visit, go to the MyChart app, find appointments, and click Begin Video Visit. Be sure to Select Allow for your device to access the Microphone and Camera for your visit. You will then be connected, and your provider will be with you shortly.  **If they have any issues connecting, or need assistance please contact MyChart service desk [phone])**  **If using a computer, in order to ensure the best quality for their visit they will need to use either of the following Internet Browsers: [employer], or Google Chrome**  IF USING DOXIMITY or DOXY.ME - The patient will receive a link just prior to their visit by text.     FULL LENGTH CONSENT FOR TELE-HEALTH VISIT   I hereby voluntarily request, consent and authorize [facility] and its employed or contracted physicians, physician assistants, nurse practitioners or other licensed health care professionals (the Practitioner), to provide me with telemedicine health care services (the Services") as deemed necessary by the treating Practitioner. I acknowledge and consent to receive the Services by the Practitioner via telemedicine. I understand that the telemedicine visit will involve communicating with the Practitioner through live audiovisual communication technology and the disclosure of certain medical information by electronic transmission. I acknowledge that I have been given the opportunity to request an in-person assessment or other available alternative prior to the telemedicine visit and am voluntarily participating in the telemedicine visit.  I understand that I have the right to withhold or withdraw my consent to the use of telemedicine  in the course of my care at any time, without affecting my right to future care or treatment, and that the Practitioner or I may terminate the telemedicine visit at any time. I understand that I have the right to inspect all information obtained and/or recorded in the course of the telemedicine visit and may receive copies of available information for a reasonable fee.  I understand that some of the potential risks of receiving the Services via telemedicine include:   Delay or interruption in medical evaluation due to technological equipment failure or disruption;  Information transmitted may not be sufficient (e.g. poor resolution of images) to allow for appropriate medical decision making by the Practitioner; and/or   In rare instances, security protocols could fail, causing a breach of personal health information.  Furthermore, I acknowledge that it is my responsibility to provide information about my medical history, conditions and care that is complete and accurate to the best of my ability. I acknowledge that Practitioner's advice, recommendations, and/or decision may be based on factors not  within their control, such as incomplete or inaccurate data provided by me or distortions of diagnostic images or specimens that may result from electronic transmissions. I understand that the practice of medicine is not an exact science and that Practitioner makes no warranties or guarantees regarding treatment outcomes. I acknowledge that I will receive a copy of this consent concurrently upon execution via email to the email address I last provided but may also request a printed copy by calling the office of [facility].    I understand that my insurance will be billed for this visit.   I have read or had this consent read to me.  I understand the contents of this consent, which adequately explains the benefits and risks of the Services being provided via telemedicine.   I have been provided ample  opportunity to ask questions regarding this consent and the Services and have had my questions answered to my satisfaction.  I give my informed consent for the services to be provided through the use of telemedicine in my medical care  By participating in this telemedicine visit I agree to the above.

## 2018-06-26 NOTE — Progress Notes (Signed)
Virtual Visit via Video Note   This visit type was conducted due to national recommendations for restrictions regarding the COVID-19 Pandemic (e.g. social distancing) in an effort to limit this patient's exposure and mitigate transmission in our community.  Due to his co-morbid illnesses, this patient is at least at moderate risk for complications without adequate follow up.  This format is felt to be most appropriate for this patient at this time.  All issues noted in this document were discussed and addressed.  A limited physical exam was performed with this format.  Please refer to the patient's chart for his consent to telehealth for [facility].   Date:  06/27/2018   ID:  Austin [name], DOB Jul 02, 1940, MRN [id number]  Patient Location: Home Provider Location: Home  [facility]P:  [facility] [place]  Cardiologist:  Lauree Chandler, MD  Electrophysiologist:  None   Evaluation Performed:  Follow-Up Visit  Chief Complaint:  Follow up- CAD  History of Present Illness:    Austin [name] is a 78 y.o. male with history of CAD, HTN, HLD and COPD who is being seen today by virtual e-visit due to the Covid19 pandemic.He was admitted June 2005 with an acute inferolateral MI. A Taxus DES was placed in the mid RCA. A Cypher DES was placed in OM1. A Taxus DES was placed in the proximal Circumflex. He has had no cardiac catheterization since 2005. Exercise stress test July 2016 at the [facility] without ischemia.   The patient denies chest pain, dyspnea, palpitations, dizziness, near syncope or syncope. No lower extremity edema.   The patient does not have symptoms concerning for COVID-19 infection (fever, chills, cough, or new shortness of breath).   Past Medical History:  Diagnosis Date  . CAD (coronary artery disease)   . Dyspnea on exertion   . GERD (gastroesophageal reflux disease)   . Hyperlipidemia   . Rectal bleeding   . Tobacco abuse   . Unspecified essential hypertension     Past Surgical History:  Procedure Laterality Date  . CORONARY ANGIOPLASTY WITH STENT PLACEMENT     Drug eluting stent placed to the first obtuse marginal with a 2.58 mm cypher drug eluting stent. Circumflex was treated with 3.0 x 18 mm TAXUS drug-eluting stent on 07/17/03  . EYE SURGERY  1971   bilateral  . PTCA     And drug-eluting stent to the RCA  . RECTAL SURGERY  2008   dr. Rosana Hoes did this surgery     Current Meds  Medication Sig  . atorvastatin (LIPITOR) 40 MG tablet Take 1 tablet (40 mg total) by mouth at bedtime.  . clopidogrel (PLAVIX) 75 MG tablet Take 1 tablet (75 mg total) by mouth daily.  Marland Kitchen losartan (COZAAR) 25 MG tablet Take 1 tablet (25 mg total) by mouth daily.  . metoprolol succinate (TOPROL-XL) 50 MG 24 hr tablet TAKE 1 TABLET BY MOUTH DAILY.  . Multiple Vitamins-Minerals (MULTIVITAMIN WITH MINERALS) tablet Take 1 tablet by mouth daily.  . nitroGLYCERIN (NITROSTAT) 0.4 MG SL tablet Place 1 tablet (0.4 mg total) under the tongue as directed.  Marland Kitchen PROAIR HFA 108 (90 Base) MCG/ACT inhaler Inhale 1 puff into the lungs every 4 (four) hours as needed for shortness of breath.  . SPIRIVA HANDIHALER 18 MCG inhalation capsule Place 1 puff into inhaler and inhale daily.  . SYMBICORT 80-4.5 MCG/ACT inhaler Inhale 2 puffs into the lungs 2 (two) times daily.  . [DISCONTINUED] atorvastatin (LIPITOR) 40 MG  tablet Take 1 tablet (40 mg total) by mouth at bedtime.  . [DISCONTINUED] clopidogrel (PLAVIX) 75 MG tablet Take 1 tablet (75 mg total) by mouth daily.  . [DISCONTINUED] losartan (COZAAR) 25 MG tablet Take 1 tablet (25 mg total) by mouth daily.  . [DISCONTINUED] metoprolol succinate (TOPROL-XL) 50 MG 24 hr tablet TAKE 1 TABLET BY MOUTH DAILY.     Allergies:   Patient has no known allergies.   Social History   Tobacco Use  . Smoking status: Former Smoker    Packs/day: 1.00  . Smokeless tobacco: Never Used  . Tobacco comment: Smoked for about 40 years, smokes cigarette every once  in a while, maybe a pack a year.  Substance Use Topics  . Alcohol use: Yes  . Drug use: No     Family Hx: The patient's family history includes Cancer in his mother; Lung disease in his father.  ROS:   Please see the history of present illness.    All other systems reviewed and are negative.   Prior CV studies:   The following studies were reviewed today:  Echo 12/03/12: Left ventricle: The cavity size was normal. Wall thickness was normal. Systolic function was normal. The estimated ejection fraction was in the range of 50% to 55%. Wall motion was normal; there were no regional wall motion abnormalities. Doppler parameters are consistent with abnormal left ventricular relaxation (grade 1 diastolic dysfunction). - Mitral valve: Calcified annulus.  Labs/Other Tests and Data Reviewed:    EKG:  No ECG reviewed.  Recent Labs: No results found for requested labs within last 8760 hours.   Recent Lipid Panel Lab Results  Component Value Date/Time   CHOL 142 01/05/2014 09:19 AM   TRIG 150.0 (H) 01/05/2014 09:19 AM   HDL 32.80 (L) 01/05/2014 09:19 AM   CHOLHDL 4 01/05/2014 09:19 AM   LDLCALC 79 01/05/2014 09:19 AM    Wt Readings from Last 3 Encounters:  06/27/18 147 lb (66.7 kg)  07/04/17 147 lb (66.7 kg)  09/21/16 146 lb 12.8 oz (66.6 kg)     Objective:    Vital Signs:  Ht 5\' 10"  (1.778 m)   Wt 147 lb (66.7 kg)   BMI 21.09 kg/m    No exam  Phone visit  ASSESSMENT & PLAN:    1. CAD without angina: No cath since 2005. He has first generation drug eluting stents and has been on lifelong DAPT with ASA and Plavix. He has stopped ASA recently due to GI upset. He had blood in his stool and had polyps removed. He does not wish to restart ASA. He has no chest pain. Will continue Plavix, beta blocker and statin.    2. HYPERTENSION: BP has been well controlled per pt     3. TOBACCO ABUSE in remission: He quit smoking  4. HYPERLIPIDEMIA: Lipids followed at the [facility] and  controlled per pt. Will continue statin  COVID-19 Education: The signs and symptoms of COVID-19 were discussed with the patient and how to seek care for testing (follow up with PCP or arrange E-visit).  The importance of social distancing was discussed today.  Time:   Today, I have spent 14 minutes with the patient with telehealth technology discussing the above problems.     Medication Adjustments/Labs and Tests Ordered: Current medicines are reviewed at length with the patient today.  Concerns regarding medicines are outlined above.   Tests Ordered: No orders of the defined types were placed in this encounter.   Medication Changes:  Meds ordered this encounter  Medications  . atorvastatin (LIPITOR) 40 MG tablet    Sig: Take 1 tablet (40 mg total) by mouth at bedtime.    Dispense:  90 tablet    Refill:  3  . clopidogrel (PLAVIX) 75 MG tablet    Sig: Take 1 tablet (75 mg total) by mouth daily.    Dispense:  90 tablet    Refill:  3  . losartan (COZAAR) 25 MG tablet    Sig: Take 1 tablet (25 mg total) by mouth daily.    Dispense:  90 tablet    Refill:  3  . metoprolol succinate (TOPROL-XL) 50 MG 24 hr tablet    Sig: TAKE 1 TABLET BY MOUTH DAILY.    Dispense:  90 tablet    Refill:  3    Disposition:  Follow up in 6 month(s)  Signed, Lauree Chandler, MD  06/27/2018 8:55 AM    [place]

## 2018-06-27 ENCOUNTER — Telehealth (INDEPENDENT_AMBULATORY_CARE_PROVIDER_SITE_OTHER): Payer: Medicare Other | Admitting: Cardiovascular Disease

## 2018-06-27 ENCOUNTER — Other Ambulatory Visit: Payer: Self-pay

## 2018-06-27 ENCOUNTER — Encounter: Payer: Self-pay | Admitting: Cardiovascular Disease

## 2018-06-27 VITALS — Ht 70.0 in | Wt 147.0 lb

## 2018-06-27 DIAGNOSIS — I2510 Atherosclerotic heart disease of native coronary artery without angina pectoris: Secondary | ICD-10-CM | POA: Diagnosis not present

## 2018-06-27 DIAGNOSIS — I10 Essential (primary) hypertension: Secondary | ICD-10-CM

## 2018-06-27 DIAGNOSIS — E782 Mixed hyperlipidemia: Secondary | ICD-10-CM | POA: Diagnosis not present

## 2018-06-27 MED ORDER — LOSARTAN POTASSIUM 25 MG PO TABS: 25.0000 mg | ORAL_TABLET | Freq: Every day | ORAL | 3 refills | Status: DC

## 2018-06-27 MED ORDER — CLOPIDOGREL BISULFATE 75 MG PO TABS: 75.0000 mg | ORAL_TABLET | Freq: Every day | ORAL | 3 refills | Status: DC

## 2018-06-27 MED ORDER — ATORVASTATIN CALCIUM 40 MG PO TABS: 40.0000 mg | ORAL_TABLET | Freq: Every day | ORAL | 3 refills | Status: DC

## 2018-06-27 MED ORDER — METOPROLOL SUCCINATE ER 50 MG PO TB24: ORAL_TABLET | ORAL | 3 refills | Status: DC

## 2018-06-27 NOTE — Patient Instructions (Signed)

## 2018-07-12 ENCOUNTER — Ambulatory Visit: Payer: Medicare Other | Admitting: Cardiovascular Disease

## 2018-11-26 ENCOUNTER — Telehealth: Payer: Self-pay | Admitting: Cardiovascular Disease

## 2018-11-26 NOTE — Telephone Encounter (Signed)
° ° ° °*  STAT* If patient is at the pharmacy, call can be transferred to refill team.   1. Which medications need to be refilled? (please list name of each medication and dose if known)  atorvastatin (LIPITOR) 40 MG tablet clopidogrel (PLAVIX) 75 MG tablet losartan (COZAAR) 25 MG tablet metoprolol succinate (TOPROL-XL) 50 MG 24 hr tablet     2. Which pharmacy/location (including street and city if local pharmacy) is medication to be sent to?[facility] 3. Do they need a 30 day or 90 day supply? [place]

## 2018-11-27 ENCOUNTER — Telehealth: Payer: Self-pay | Admitting: *Deleted

## 2018-11-27 NOTE — Telephone Encounter (Signed)
Received message from scheduling team that pt wanted to discuss scheduling of 6 month follow up appointment.  I placed call to pt and left message to call office  I have placed hold on 2:00 spot on 11/18 for pt.  Need to confirm if he would like this appointment.

## 2018-11-27 NOTE — Telephone Encounter (Signed)
I spoke with pt. He cannot be here on 01/01/19.  He reports he is doing well and wants to see Dr Angelena Form for 6 month follow up.  I scheduled appointment for pt to see Dr Angelena Form on November 13,2020 at 10:40

## 2018-11-27 NOTE — Telephone Encounter (Signed)
Called pt to inform him that he has refills on these medications already at [place] mail order pharmacy and that he just need to give them a call to request refills. I advise the pt that if he has any other problems, questions or concerns to call the office. Pt verbalized understanding.

## 2018-12-27 ENCOUNTER — Encounter: Payer: Self-pay | Admitting: Cardiovascular Disease

## 2018-12-27 ENCOUNTER — Other Ambulatory Visit: Payer: Self-pay

## 2018-12-27 ENCOUNTER — Ambulatory Visit (INDEPENDENT_AMBULATORY_CARE_PROVIDER_SITE_OTHER): Payer: Medicare Other | Admitting: Cardiovascular Disease

## 2018-12-27 VITALS — BP 118/68 | HR 78 | Ht 70.0 in | Wt 137.8 lb

## 2018-12-27 DIAGNOSIS — I2510 Atherosclerotic heart disease of native coronary artery without angina pectoris: Secondary | ICD-10-CM | POA: Diagnosis not present

## 2018-12-27 DIAGNOSIS — E782 Mixed hyperlipidemia: Secondary | ICD-10-CM

## 2018-12-27 DIAGNOSIS — I10 Essential (primary) hypertension: Secondary | ICD-10-CM | POA: Diagnosis not present

## 2018-12-27 NOTE — Patient Instructions (Signed)

## 2018-12-27 NOTE — Progress Notes (Signed)
Chief Complaint  Patient presents with  . Follow-up    CAD    History of Present Illness: 78 yo male with history of CAD, HTN, HLD and COPD who is being seen today for cardiac follow up. 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 A Taxus DES was placed in the mid RCA. A Cypher DES was placed in OM1. A Taxus DES was placed in the proximal Circumflex. He has had no cardiac catheterization since 2005. [facility]   He is here today for follow up. The patient denies any chest pain, dyspnea, palpitations, lower extremity edema, orthopnea, PND, dizziness, near syncope or syncope.   [facility] [place]   Past Medical History:  Diagnosis Date  . CAD (coronary artery disease)   . Dyspnea on exertion   . GERD (gastroesophageal reflux disease)   . Hyperlipidemia   . Rectal bleeding   . Tobacco abuse   . Unspecified essential hypertension     Past Surgical History:  Procedure Laterality Date  . CORONARY ANGIOPLASTY WITH STENT PLACEMENT     Drug eluting stent placed to the first obtuse marginal with a 2.58 mm cypher drug eluting stent. Circumflex was treated with 3.0 x 18 mm TAXUS drug-eluting stent on 07/17/03  . EYE SURGERY  1971   bilateral  . PTCA     And drug-eluting stent to the RCA  . RECTAL SURGERY  2008   dr. Rosana Hoes did this surgery    Current Outpatient Medications  Medication Sig Dispense Refill  . atorvastatin (LIPITOR) 40 MG tablet Take 1 tablet (40 mg total) by mouth at bedtime. 90 tablet 3  . clopidogrel (PLAVIX) 75 MG tablet Take 1 tablet (75 mg total) by mouth daily. 90 tablet 3  . guaiFENesin (MUCINEX) 600 MG 12 hr tablet Take 600 mg by mouth 2 (two) times daily as needed.    Marland Kitchen losartan (COZAAR) 25 MG tablet Take 1 tablet (25 mg total) by mouth daily. 90 tablet 3  . metoprolol succinate (TOPROL-XL) 50 MG 24 hr tablet TAKE 1 TABLET BY MOUTH DAILY. 90 tablet 3  . Multiple  Vitamins-Minerals (MULTIVITAMIN WITH MINERALS) tablet Take 1 tablet by mouth daily.    . nitroGLYCERIN (NITROSTAT) 0.4 MG SL tablet Place 1 tablet (0.4 mg total) under the tongue as directed. 25 tablet 3  . PROAIR HFA 108 (90 Base) MCG/ACT inhaler Inhale 1 puff into the lungs every 4 (four) hours as needed for shortness of breath.    . SPIRIVA RESPIMAT 2.5 MCG/ACT AERS     . SYMBICORT 160-4.5 MCG/ACT inhaler      No current facility-administered medications for this visit.     No Known Allergies  Social History   Socioeconomic History  . Marital status: Married    Spouse name: Not on file  . Number of children: Not on file  . Years of education: Not on file  . Highest education level: Not on file  Occupational History  . Occupation: Retired Magazine features editor: RETIRED  . Occupation: Runs a cardiac rehab  Social Needs  . Financial resource strain: Not on file  . Food insecurity    Worry: Not on file    Inability: Not on file  . Transportation needs    Medical: Not on file    Non-medical: Not on file  Tobacco Use  . Smoking status: Former Smoker    Packs/day:  1.00  . Smokeless tobacco: Never Used  . Tobacco comment: Smoked for about 40 years, smokes cigarette every once in a while, maybe a pack a year.  Substance and Sexual Activity  . Alcohol use: Yes  . Drug use: No  . Sexual activity: Not on file  Lifestyle  . Physical activity    Days per week: Not on file    Minutes per session: Not on file  . Stress: Not on file  Relationships  . Social Herbalist on phone: Not on file    Gets together: Not on file    Attends religious service: Not on file    Active member of club or organization: Not on file    Attends meetings of clubs or organizations: Not on file    Relationship status: Not on file  . Intimate partner violence    Fear of current or ex partner: Not on file    Emotionally abused: Not on file    Physically abused: Not on file    Forced  sexual activity: Not on file  Other Topics Concern  . Not on file  Social History Narrative   Lives in [place] with his wife   [place] veteran   Exercises 3 times a week at the [employer]    Family History  Problem Relation Age of Onset  . Cancer Mother        cervical  . Lung disease Father        Black lung    Review of Systems:  As stated in the HPI and otherwise negative.   BP 118/68   Pulse 78   Ht 5\' 10"  (1.778 m)   Wt 137 lb 12.8 oz (62.5 kg)   SpO2 94%   BMI 19.77 kg/m   Physical Examination:  General: Well developed, well nourished, NAD  HEENT: OP clear, mucus membranes moist  SKIN: warm, dry. No rashes. Neuro: No focal deficits  Musculoskeletal: Muscle strength 5/5 all ext  Psychiatric: Mood and affect normal  Neck: No JVD, no carotid bruits, no thyromegaly, no lymphadenopathy.  Lungs:Clear bilaterally, no wheezes, rhonci, crackles Cardiovascular: Regular rate and rhythm. No murmurs, gallops or rubs. Abdomen:Soft. Bowel sounds present. Non-tender.  Extremities: No lower extremity edema. Pulses are 2 + in the bilateral DP/PT.  Echo 12/03/12: Left ventricle: The cavity size was normal. Wall thickness was normal. Systolic function was normal. The estimated ejection fraction was in the range of 50% to 55%. Wall motion was normal; there were no regional wall motion abnormalities. Doppler parameters are consistent with abnormal left ventricular relaxation (grade 1 diastolic dysfunction). - Mitral valve: Calcified annulus.  EKG:  EKG is ordered today. The ekg ordered today demonstrates NSR, rate 78 bpm.   Recent Labs: No results found for requested labs within last 8760 hours.   Lipid Panel    Component Value Date/Time   CHOL 142 01/05/2014 0919   TRIG 150.0 (H) 01/05/2014 0919   HDL 32.80 (L) 01/05/2014 0919   CHOLHDL 4 01/05/2014 0919   VLDL 30.0 01/05/2014 0919   LDLCALC 79 01/05/2014 0919     Wt Readings from Last 3 Encounters:   12/27/18 137 lb 12.8 oz (62.5 kg)  06/27/18 147 lb (66.7 kg)  07/04/17 147 lb (66.7 kg)     Other studies Reviewed: Additional studies/ records that were reviewed today include: . Review of the above records demonstrates:    Assessment and Plan:   1. CAD without angina: No chest  pain. He has been on dual anti-platelet therapy with ASA and Plavix for years since he has a first generation Taxus drug eluting stent but he decided in 2020 he did not want to continue taking ASA. Will continue Plavix, statin and beta blocker.    2. HYPERTENSION: BP controlled.      3. TOBACCO ABUSE: He has stopped smoking   4. HYPERLIPIDEMIA: [facility] and controlled per pt. Will continue statin.      Current medicines are reviewed at length with the patient today.  The patient does not have concerns regarding medicines.  The following changes have been made:  no change  Labs/ tests ordered today include:    Orders Placed This Encounter  Procedures  . EKG 12-Lead    Disposition:   FU with me in 12 months  Signed, Lauree Chandler, MD 12/27/2018 11:38 AM    [facility] [place], [place], [place]  [address] Phone: [phone]; Fax: [phone]

## 2019-02-26 ENCOUNTER — Ambulatory Visit: Payer: Medicare Other | Admitting: Cardiovascular Disease

## 2019-06-16 ENCOUNTER — Other Ambulatory Visit: Payer: Self-pay

## 2019-06-16 MED ORDER — ATORVASTATIN CALCIUM 40 MG PO TABS: 40.0000 mg | ORAL_TABLET | Freq: Every day | ORAL | 1 refills | Status: DC

## 2019-06-16 MED ORDER — METOPROLOL SUCCINATE ER 50 MG PO TB24: ORAL_TABLET | ORAL | 1 refills | Status: DC

## 2019-06-16 MED ORDER — CLOPIDOGREL BISULFATE 75 MG PO TABS: 75.0000 mg | ORAL_TABLET | Freq: Every day | ORAL | 1 refills | Status: DC

## 2019-06-16 MED ORDER — LOSARTAN POTASSIUM 25 MG PO TABS: 25.0000 mg | ORAL_TABLET | Freq: Every day | ORAL | 1 refills | Status: DC

## 2019-06-16 NOTE — Telephone Encounter (Signed)
Pt's medications were sent to pt's pharmacy as requested. Confirmation received.  

## 2019-06-18 MED ORDER — METOPROLOL SUCCINATE ER 50 MG PO TB24: ORAL_TABLET | ORAL | 1 refills | Status: DC

## 2019-06-18 MED ORDER — LOSARTAN POTASSIUM 25 MG PO TABS: 25.0000 mg | ORAL_TABLET | Freq: Every day | ORAL | 1 refills | Status: DC

## 2019-06-18 MED ORDER — ATORVASTATIN CALCIUM 40 MG PO TABS: 40.0000 mg | ORAL_TABLET | Freq: Every day | ORAL | 1 refills | Status: DC

## 2019-06-18 MED ORDER — CLOPIDOGREL BISULFATE 75 MG PO TABS: 75.0000 mg | ORAL_TABLET | Freq: Every day | ORAL | 1 refills | Status: DC

## 2019-06-18 NOTE — Addendum Note (Signed)
Addended by: Derl Barrow on: 06/18/2019 04:56 PM   Modules accepted: Orders

## 2019-10-14 ENCOUNTER — Encounter: Payer: Self-pay | Admitting: Physician Assistant

## 2019-11-10 ENCOUNTER — Encounter: Payer: Self-pay | Admitting: Cardiovascular Disease

## 2019-11-10 ENCOUNTER — Ambulatory Visit (INDEPENDENT_AMBULATORY_CARE_PROVIDER_SITE_OTHER): Payer: Medicare Other | Admitting: Cardiovascular Disease

## 2019-11-10 ENCOUNTER — Other Ambulatory Visit: Payer: Self-pay

## 2019-11-10 VITALS — BP 122/70 | HR 55 | Ht 70.0 in | Wt 139.2 lb

## 2019-11-10 DIAGNOSIS — I10 Essential (primary) hypertension: Secondary | ICD-10-CM | POA: Diagnosis not present

## 2019-11-10 DIAGNOSIS — I2510 Atherosclerotic heart disease of native coronary artery without angina pectoris: Secondary | ICD-10-CM | POA: Diagnosis not present

## 2019-11-10 DIAGNOSIS — E782 Mixed hyperlipidemia: Secondary | ICD-10-CM

## 2019-11-10 MED ORDER — METOPROLOL SUCCINATE ER 50 MG PO TB24: ORAL_TABLET | ORAL | 3 refills | Status: DC

## 2019-11-10 MED ORDER — NITROGLYCERIN 0.4 MG SL SUBL: 0.4000 mg | SUBLINGUAL_TABLET | SUBLINGUAL | 3 refills | Status: DC

## 2019-11-10 MED ORDER — ATORVASTATIN CALCIUM 40 MG PO TABS: 40.0000 mg | ORAL_TABLET | Freq: Every day | ORAL | 3 refills | Status: DC

## 2019-11-10 MED ORDER — CLOPIDOGREL BISULFATE 75 MG PO TABS
75.0000 mg | ORAL_TABLET | Freq: Every day | ORAL | 3 refills | Status: DC
Start: 2019-11-10 — End: 2020-10-22

## 2019-11-10 MED ORDER — LOSARTAN POTASSIUM 25 MG PO TABS
25.0000 mg | ORAL_TABLET | Freq: Every day | ORAL | 3 refills | Status: DC
Start: 2019-11-10 — End: 2020-10-22

## 2019-11-10 NOTE — Addendum Note (Signed)
Addended by: Rodman Key on: 11/10/2019 11:07 AM   Modules accepted: Orders

## 2019-11-10 NOTE — Progress Notes (Signed)
Chief Complaint  Patient presents with  . Follow-up    CAD    History of Present Illness: 79 yo male with history of CAD, HTN, HLD and COPD who is being seen today for cardiac follow up. 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 A Taxus DES was placed in the mid RCA. A Cypher DES was placed in OM1. A Taxus DES was placed in the proximal Circumflex. He has had no cardiac catheterization since 2005. [facility]   He is here today for follow up. The patient denies any chest pain, dyspnea, palpitations, lower extremity edema, orthopnea, PND, dizziness, near syncope or syncope.    Primary Care Physician: [place]  Past Medical History:  Diagnosis Date  . CAD (coronary artery disease)   . Dyspnea on exertion   . GERD (gastroesophageal reflux disease)   . Hyperlipidemia   . Rectal bleeding   . Tobacco abuse   . Unspecified essential hypertension     Past Surgical History:  Procedure Laterality Date  . CORONARY ANGIOPLASTY WITH STENT PLACEMENT     Drug eluting stent placed to the first obtuse marginal with a 2.58 mm cypher drug eluting stent. Circumflex was treated with 3.0 x 18 mm TAXUS drug-eluting stent on 07/17/03  . EYE SURGERY  1971   bilateral  . PTCA     And drug-eluting stent to the RCA  . RECTAL SURGERY  2008   dr. Rosana Hoes did this surgery    Current Outpatient Medications  Medication Sig Dispense Refill  . atorvastatin (LIPITOR) 40 MG tablet Take 1 tablet (40 mg total) by mouth at bedtime. 90 tablet 1  . clopidogrel (PLAVIX) 75 MG tablet Take 1 tablet (75 mg total) by mouth daily. 90 tablet 1  . guaiFENesin (MUCINEX) 600 MG 12 hr tablet Take 600 mg by mouth 2 (two) times daily as needed.    Marland Kitchen losartan (COZAAR) 25 MG tablet Take 1 tablet (25 mg total) by mouth daily. 90 tablet 1  . metoprolol succinate (TOPROL-XL) 50 MG 24 hr tablet TAKE 1 TABLET BY MOUTH DAILY. 90 tablet 1  . Multiple  Vitamins-Minerals (MULTIVITAMIN WITH MINERALS) tablet Take 1 tablet by mouth daily.    . nitroGLYCERIN (NITROSTAT) 0.4 MG SL tablet Place 1 tablet (0.4 mg total) under the tongue as directed. 25 tablet 3  . PROAIR HFA 108 (90 Base) MCG/ACT inhaler Inhale 1 puff into the lungs every 4 (four) hours as needed for shortness of breath.    . SPIRIVA RESPIMAT 2.5 MCG/ACT AERS     . SYMBICORT 160-4.5 MCG/ACT inhaler      No current facility-administered medications for this visit.    No Known Allergies  Social History   Socioeconomic History  . Marital status: Married    Spouse name: Not on file  . Number of children: Not on file  . Years of education: Not on file  . Highest education level: Not on file  Occupational History  . Occupation: Retired Magazine features editor: RETIRED  . Occupation: Runs a cardiac rehab  Tobacco Use  . Smoking status: Former Smoker    Packs/day: 1.00  . Smokeless tobacco: Never Used  . Tobacco comment: Smoked for about 40 years, smokes cigarette every once in a while, maybe a pack a year.  Vaping Use  . Vaping Use: Never used  Substance and Sexual Activity  . Alcohol use: Yes  .  Drug use: No  . Sexual activity: Not on file  Other Topics Concern  . Not on file  Social History Narrative   Lives in [place] with his wife   [place] veteran   Exercises 3 times a week at the [employer] [facility]   Financial Resource Strain:   . Difficulty of Paying Living Expenses: Not on file  Food Insecurity:   . Worried About Charity fundraiser in the Last Year: Not on file  . Ran Out of Food in the Last Year: Not on file  Transportation Needs:   . Lack of Transportation (Medical): Not on file  . Lack of Transportation (Non-Medical): Not on file  Physical Activity:   . Days of Exercise per Week: Not on file  . Minutes of Exercise per Session: Not on file  Stress:   . Feeling of Stress : Not on file  Social Connections:   .  Frequency of Communication with Friends and Family: Not on file  . Frequency of Social Gatherings with Friends and Family: Not on file  . Attends Religious Services: Not on file  . Active Member of Clubs or Organizations: Not on file  . Attends Archivist Meetings: Not on file  . Marital Status: Not on file  Intimate Partner Violence:   . Fear of Current or Ex-Partner: Not on file  . Emotionally Abused: Not on file  . Physically Abused: Not on file  . Sexually Abused: Not on file    Family History  Problem Relation Age of Onset  . Cancer Mother        cervical  . Lung disease Father        Black lung    Review of Systems:  As stated in the HPI and otherwise negative.   BP 122/70   Pulse (!) 55   Ht 5\' 10"  (1.778 m)   Wt 139 lb 3.2 oz (63.1 kg)   SpO2 96%   BMI 19.97 kg/m   Physical Examination:  General: Well developed, well nourished, NAD  HEENT: OP clear, mucus membranes moist  SKIN: warm, dry. No rashes. Neuro: No focal deficits  Musculoskeletal: Muscle strength 5/5 all ext  Psychiatric: Mood and affect normal  Neck: No JVD, no carotid bruits, no thyromegaly, no lymphadenopathy.  Lungs:Clear bilaterally, no wheezes, rhonci, crackles Cardiovascular: Regular rate and rhythm. No murmurs, gallops or rubs. Abdomen:Soft. Bowel sounds present. Non-tender.  Extremities: No lower extremity edema. Pulses are 2 + in the bilateral DP/PT.  Echo 12/03/12: Left ventricle: The cavity size was normal. Wall thickness was normal. Systolic function was normal. The estimated ejection fraction was in the range of 50% to 55%. Wall motion was normal; there were no regional wall motion abnormalities. Doppler parameters are consistent with abnormal left ventricular relaxation (grade 1 diastolic dysfunction). - Mitral valve: Calcified annulus.  EKG:  EKG is not ordered today. The ekg ordered today demonstrates   Recent Labs: No results found for requested labs within last  8760 hours.   Lipid Panel    Component Value Date/Time   CHOL 142 01/05/2014 0919   TRIG 150.0 (H) 01/05/2014 0919   HDL 32.80 (L) 01/05/2014 0919   CHOLHDL 4 01/05/2014 0919   VLDL 30.0 01/05/2014 0919   LDLCALC 79 01/05/2014 0919     Wt Readings from Last 3 Encounters:  11/10/19 139 lb 3.2 oz (63.1 kg)  12/27/18 137 lb 12.8 oz (62.5 kg)  06/27/18 147 lb (66.7  kg)     Other studies Reviewed: Additional studies/ records that were reviewed today include: . Review of the above records demonstrates:    Assessment and Plan:   1. CAD without angina: No chest pain. Continue Plavix, statin and beta blocker. We discussed arranging an echo today but he wants to wait until next time. This would be to assess LV function.   2. HYPERTENSION: BP is well controlled. Continue current therapy     3. TOBACCO ABUSE: He no longer smokes  4. HYPERLIPIDEMIA: Lipids followed at the [facility] and controlled per pt. Continue statin     Current medicines are reviewed at length with the patient today.  The patient does not have concerns regarding medicines.  The following changes have been made:  no change  Labs/ tests ordered today include:    No orders of the defined types were placed in this encounter.   Disposition:   FU with me in 12 months  Signed, Lauree Chandler, MD 11/10/2019 10:31 AM    [facility] [place], [place], [place]  [address] Phone: [phone]; Fax: [phone]

## 2019-11-10 NOTE — Patient Instructions (Signed)

## 2019-12-24 ENCOUNTER — Ambulatory Visit: Payer: Medicare Other | Admitting: Cardiovascular Disease

## 2019-12-30 ENCOUNTER — Ambulatory Visit: Payer: Medicare Other | Admitting: Physician Assistant

## 2020-10-22 ENCOUNTER — Other Ambulatory Visit: Payer: Self-pay | Admitting: Cardiovascular Disease

## 2020-12-06 NOTE — Progress Notes (Signed)
Cardiology Office Note    Date:  12/10/2020   ID:  Austin [name], DOB 1940/06/12, MRN [id number]  PCP:  [facility]  Cardiologist:  Lauree Chandler, MD  Electrophysiologist:  None   Chief Complaint: f/u CAD  History of Present Illness:   Austin [name] is a 80 y.o. male with history of CAD, HTN, HLD, COPD, tobacco abuse, GERD who presents for routine follow-up, last OV 10/2019. He was admitted 07/2003 with acute MI and underwent DES to the RCA. He returned to the lab that admission for planned staged DES to the OM1 and proximal Cx. 2D Echo 11/2012 showed EF 50-55%, grade 1 DD. Per Dr. Camillia Herter notes, he had an exercise stress test July 2018 at the [facility] without ischemia. At [place] last year Dr. Angelena Form recommended consideration of updated echo but the patient wished to defer as he was doing well.  He returns for follow-up from cardiac standpoint and states he feels he's doing well. He has some degree of unchanged mild chronic DOE. He denies any progression of symptoms. No recent CP, palpitations, dizziness or syncope. He states he's been following at the [facility] for some weight loss of 15 lb over 2 years. He states he was found to have a nodule on the chest wall and underwent mammogram and is scheduled for CT and PET scan. No hemoptysis or unusual bleeding. States he had labs at [place] in July or August of this year.  Labwork independently reviewed: Scanned labs 2019 CBC wnl, K 4.2, Cr 1.0   Past Medical History:  Diagnosis Date   CAD (coronary artery disease)    Dyspnea on exertion    GERD (gastroesophageal reflux disease)    Hyperlipidemia    Rectal bleeding    Tobacco abuse    Unspecified essential hypertension     Past Surgical History:  Procedure Laterality Date   CORONARY ANGIOPLASTY WITH STENT PLACEMENT     Drug eluting stent placed to the first obtuse marginal with a 2.58 mm cypher drug eluting stent. Circumflex was treated with 3.0 x 18 mm TAXUS drug-eluting stent  on 07/17/03   EYE SURGERY  1971   bilateral   PTCA     And drug-eluting stent to the RCA   RECTAL SURGERY  2008   dr. Rosana Hoes did this surgery    Current Medications: Current Meds  Medication Sig   atorvastatin (LIPITOR) 40 MG tablet Take 1 tablet (40 mg total) by mouth at bedtime. Patient needs to keep October appointment for future refills.   clopidogrel (PLAVIX) 75 MG tablet Take 1 tablet (75 mg total) by mouth daily. Patient needs to keep October appointment for future refills.   fluticasone-salmeterol (ADVAIR) 250-50 MCG/ACT AEPB INHALE 1 PUFF BY ORAL INHALATION TWO TIMES A DAY REPLACES BUDESONIDE/FORMOTEROL   guaiFENesin (MUCINEX) 600 MG 12 hr tablet Take 600 mg by mouth 2 (two) times daily as needed.   losartan (COZAAR) 25 MG tablet Take 1 tablet (25 mg total) by mouth daily. Patient needs to keep October appointment for future refills.   metoprolol succinate (TOPROL-XL) 50 MG 24 hr tablet TAKE 1 TABLET by mouth ( 50 mg) daily. Patient needs to keep follow up in October for refills.   Multiple Vitamins-Minerals (MULTIVITAMIN WITH MINERALS) tablet Take 1 tablet by mouth daily.   nitroGLYCERIN (NITROSTAT) 0.4 MG SL tablet Place 1 tablet (0.4 mg total) under the tongue as directed.   PROAIR HFA 108 (90 Base) MCG/ACT inhaler Inhale 1 puff into  the lungs every 4 (four) hours as needed for shortness of breath.   SPIRIVA RESPIMAT 2.5 MCG/ACT AERS    SYMBICORT 160-4.5 MCG/ACT inhaler    triamcinolone cream (KENALOG) 0.1 % 2 (two) times daily.      Allergies:   Patient has no known allergies.   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  Vaping Use   Vaping Use: Never used  Substance and Sexual Activity   Alcohol use: Yes   Drug use: No   Sexual activity: Not on file  Other Topics Concern   Not on file  Social History Narrative   Lives in [place] with his wife   [place] veteran   Exercises 3 times a week at the [place] Determinants of Health   Financial Resource Strain: Not on file  Food Insecurity: Not on file  Transportation Needs: Not on file  Physical Activity: Not on file  Stress: Not on file  Social Connections: Not on file     Family History:  The patient's family history includes Cancer in his mother; Lung disease in his father.  ROS:   Please see the history of present illness. All other systems are reviewed and otherwise negative.    EKGs/Labs/Other Studies Reviewed:    Studies reviewed are outlined and summarized above. Reports included below if pertinent.  2D echo 11/2012 - Left ventricle: The cavity size was normal. Wall thickness    was normal. Systolic function was normal. The estimated    ejection fraction was in the range of 50% to 55%. Wall    motion was normal; there were no regional wall motion    abnormalities. Doppler parameters are consistent with    abnormal left ventricular relaxation (grade 1 diastolic    dysfunction).  - Mitral valve: Calcified annulus.    Cath 07/16/2003  CARDIAC CATHETERIZATION    INDICATIONS:  [name] is a pleasant gentleman who presents with an acute  coronary syndrome.  He has inferolateral T-wave inversion and chest pain.  He has been having off and on chest pain for about a month.  He was seen by  Dr. Lyndel Safe in the emergency room and subsequently admitted for early  evaluation.    PROCEDURE:  1. Left heart catheterization.  2. Selective coronary arteriography.  3. Selective left  ventriculography.  4. Percutaneous coronary intervention in the right coronary artery.    DESCRIPTION OF THE PROCEDURE:  The patient was brought to the cath lab and  prepped and draped in the usual fashion.  Through an anterior puncture the  right femoral artery was easily entered.  A 6 French sheath was initially  placed.  Views of the left and right coronaries were obtained in multiple  angiographic projections.  Central aortic and left ventricular pressures  were measured with the pigtail.  Ventriculography was performed in the  biplane manner.  Following a pressure pullback, the pigtail catheter was  removed.  The patient had significant disease of the circumflex, but  subtotal occlusion of the right coronary artery with some slow antegrade  flow.  Based on this, it was our feeling that we should address the right  coronary artery first.  I discussed this with the patient and he was  agreeable.  We sent our staff out to speak with the patient's family and  take pictures.  The patient had been enrolled in the TRITON study.  Heparin  and Integrilin were given according to protocol.  A 6 French sheath was  upgraded to a 7 [place] sheath.  A JR4 guiding catheter with side holes was  then utilized to intubate the right coronary artery.  There was some very  mild ostial disease with a slight damping of the catheter with entry.  The  right coronary artery was then crossed using a Hi-Torque Floppy wire.  Predilatation was done with the 2.25 mm 15 mm Maverick balloon.  Following  this, we carefully gauged size and placed a 2.75 x 32 TAXUS drug-eluting  stent.  The stent choice was based upon length of lesion.  There was marked  improvement in the appearance of the artery, with the stent taken up to  about 12-13 atmospheres.  We then took a second wire down and passed it into  the acute marginal branch which was pinched.  Post dilatation was done with  a 3 mm power sail balloon.  This was taken  out throughout the course of the  stent with marked improvement in the appearance of the artery.  The second  guide wire was across the side branch, and this was then dilated with a 2.25  mm balloon.  Initially there was about a 70% area of narrowing which became  90.  After predilatation, this was back to about 70.  Elected not to go  higher with the balloon for fear of compromising the stent to the main  portion of the right coronary artery.  There was excellent antegrade flow  throughout the entire vessel.  All wires were subsequently removed.  Generous doses of intracoronary nitroglycerin were given throughout the  course of the procedure.    HEMODYNAMIC DATA:  1. Central aortic pressure 171/79.  2. Left ventricle 155/12.  3. No gradient on pullback across the aortic valve.    ANGIOGRAPHIC DATA:  1. Ventriculography was performed in the RAO projection.  The ejection     fraction was calculated at 59%.  There was inferobasal hypokinesis.  This     was rather severe.  This was also noted in the LAO projection.  2. The left main coronary artery was free of critical disease.  3. The LAD bifurcates fairly proximally into a large septal perforator and     an LAD proper.  The LAD proper has about 30% narrowing in some views.     The septal perforator probably has about 60% narrowing and is a fairly     large caliber vessel.  4. The circumflex proper demonstrates a slightly segmental area of disease     in the proximal vessel that has about 75% narrowing.  There is some mild     irregularity involving the ostium.  There is a marginal branch that has a     hazy 90% area of stenosis proximally.  The continuation portion of the     circumflex has about 30% narrowing.  5. The right  coronary artery has about 30% narrowing at the ostium.  This is     followed by a 95% stenosis and then diffuse 70% narrowing overlying the     origin of the acute marginal.  Following the stenting of this  vessel,     this was reduced to 0%.  This was done with a 2.75 x 32 TAXUS drug-     eluting stent with post dilatation to 30.  The acute marginal branch,     which is covered by the stent, as noted was about 70%, went to 90% and     then back down to about 70% after balloon dilatation.  There was     excellent runoff into the PDA.  Posterolateral exhibited a lot of spasm     throughout the course of the study.  There was a 40-50% area of narrowing     just beyond the bend in the posterolateral branch.    CONCLUSIONS:  1. Non-ST elevation myocardial infarction involving probably the right     coronary artery with successful stenting of the main channel and balloon     dilatation of the acute marginal branch to reopen this vessel.  2. Significant disease of the circumflex coronary artery.    DISPOSITION:  The patient has been started in the TRITON study.  He is  agreeable to this.  We will treat him medically at the present time.  We  will recommend that he come back to the catheterization laboratory on  Friday, and his name has been placed on the schedule.  He will need an intervention of the circumflex artery at that time.  I will be out of town,  will pass this on to my partners.                                                  Loretha Brasil. Lia Foyer, M.D. [facility]      TDS/MEDQ  D:  07/15/2003  T:  07/16/2003  Job:  [id number]  Cath 07/17/2003 CARDIAC CATHETERIZATION    PROCEDURE:  Drug-eluting stent placement in the proximal circumflex and  first obtuse marginal.    INDICATION:  Mr. Brandel is a 80 year old gentleman who presented on June 1  with acute myocardial infarction.  Culprit lesion was at the RCA which was  treated by Dr. Lia Foyer with drug-eluting stent placement.  He has had an  uncomplicated course thereafter.  He now returns to the lab for planned  revascularization of two severe stenoses, one in the proximal circumflex and  one in the proximal portion of the obtuse marginal.     PROCEDURAL TECHNIQUE:  Informed consent was obtained.  Under 1% lidocaine  local anesthesia, a 6 French sheath was placed in the left common femoral  artery using the modified Seldinger technique.  A 6 [place] JR-4 guide was  advanced over wire and engaged in the ostium of the left main.  Anticoagulation was initiated with heparin and double bolus eptifibatide to  achieve and maintain an ACT of greater than 200 seconds.  A Luge wire was  advanced to the distal portion of the first OM without difficulty.  The  lesion of the OM was directly stented using a 2.5 x 8-mm Cypher at 12  atmospheres.  Immediately after stent placement there was severe spasm of  the downstream vessel.  This was initially unresponsive to nitroglycerin.  The stent was post dilated using a 2.5 x 8-mm Quantum at 16 atmospheres.  Wire was then partially withdrawn from the obtuse marginal.  With this, the  spasm relieved and it was seen to have no residual stenosis and no  dissection.    Attention was then turned to the lesion of the proximal circumflex.  It was  directly stented using a 3.0 x 18-mm Taxus at 18 atmospheres.  The stent was  then post dilated using a 3.25 x 15-mm PowerSail at 16 atmospheres.  After  post dilation, there was some apparent plaque shift into the proximal  circumflex.  There does not appear to be any severe stenosis, but  visualization was difficult due to overlap of the LAD.  I therefore  proceeded to intravascular ultrasound.  After the administration of  intracoronary nitroglycerin, IVUS catheter was positioned in the mid portion  of the circumflex.  Automated pullback was performed.  This demonstrated the  stent to be fully expanded and well apposed throughout its course.  The  stented segment had an area of 7.5 sq m.  Proximal to the stent the most  severe stenosis was just at the circumflex ostium.  This had a residual  lumen area of 4.9 sq cm for approximately 35% area of stenosis.   There was  no evidence of dissection and TIMI-3 flow to the distal vasculature.  IVUS  catheter and wire were then removed.  Repeat angiography demonstrated no  residual stenosis and TIMI-3 flow to the distal vasculature.  The patient  tolerated the procedure well and transferred to the holding room in stable  condition.  Sheaths will be removed when the ACT is less than 150 seconds.    IMPRESSION/RECOMMENDATIONS:  Successful drug-eluting stent placement in both  the first obtuse marginal and the proximal circumflex.  He will be continued  on eptifibatide for 18 hours.  Tritan study will be continued.                                                  Ethelle Lyon, M.D. [facility]    EKG:  EKG is ordered today, personally reviewed, demonstrating SB 57bpm no acute STT changes  Recent Labs: No results found for requested labs within last 8760 hours.  Recent Lipid Panel    Component Value Date/Time   CHOL 142 01/05/2014 0919   TRIG 150.0 (H) 01/05/2014 0919   HDL 32.80 (L) 01/05/2014 0919   CHOLHDL 4 01/05/2014 0919   VLDL 30.0 01/05/2014 0919   LDLCALC 79 01/05/2014 0919    PHYSICAL EXAM:    VS:  BP 112/70   Pulse (!) 57   Ht 5\' 10"  (1.778 m)   Wt 132 lb 12.8 oz (60.2 kg)   SpO2 97%   BMI 19.05 kg/m   BMI: Body mass index is 19.05 kg/m.  GEN: Thin, cachectic appearing male in no acute distress HEENT: normocephalic, atraumatic Neck: no JVD, carotid bruits, or masses Cardiac: RRR; no murmurs, rubs, or gallops, no edema  Respiratory:  clear to auscultation bilaterally, normal work of breathing GI: soft, nontender, nondistended, + BS MS: no deformity or atrophy Skin: warm and dry, no rash Neuro:  Alert and Oriented x 3, Strength and sensation are intact, follows commands Psych: euthymic mood, full affect  Wt Readings  from Last 3 Encounters:  12/10/20 132 lb 12.8 oz (60.2 kg)  11/10/19 139 lb 3.2 oz (63.1 kg)  12/27/18 137 lb 12.8 oz (62.5 kg)     ASSESSMENT & PLAN:    1. CAD - doing well without recent angina. EKG without significant abnormality. Has chronic mild DOE but states this is unchanged from prior. He will continue Plavix monotherapy, metoprolol, atorvastatin, losartan. We will request all updated labs from [facility] primary care - specifically looking for CMET, CBC and lipid profile. Meds refilled today: atorvastatin, Plavix, losartan, Toprol. Denies needing refill on NTG - states he has plenty. Has not had to use this at all recently. Post-visit while looking deeper into records I did note his metoprolol dose is listed on the [place] records than we have on ours (100mg  on [place], 50mg  on ours). We will have him review all of his medicines when he gets home and call us to ensure the proper dose gets refilled. He was asked to ask for Rico Junker when he calls. We did discuss updating his echocardiogram but he politely declined, stating he does not wish to pursue this as he feels he is doing well.  2. Essential HTN - controlled on present regimen. Follow plan as advised in #1. No changes made today, just need to clarify metoprolol dose.  3. Hyperlipidemia - patient requesting refill on atorvastatin today. We will go ahead and refill but request records from [place] for most recent labwork. Next year he will bring a manual paper copy with him to his visit.  4. Tobacco abuse - former smoker but with rare intermittent breakthrough use noted. Cessation advised. Has ongoing f/u at [facility] for weight loss evaluation.  Disposition: F/u with Dr. Angelena Form in 1 year.   Medication Adjustments/Labs and Tests Ordered: Current medicines are reviewed at length with the patient today.  Concerns regarding medicines are outlined above. Medication changes, Labs and Tests ordered today are summarized above and listed in the Patient Instructions accessible in Encounters.   Signed, Charlie Pitter, PA-C  12/10/2020 10:39 AM    [address], [place], [place]   [address] Phone: [phone]; Fax: [phone]

## 2020-12-10 ENCOUNTER — Ambulatory Visit (INDEPENDENT_AMBULATORY_CARE_PROVIDER_SITE_OTHER): Payer: Medicare (Managed Care) | Admitting: Physician Assistant

## 2020-12-10 ENCOUNTER — Other Ambulatory Visit: Payer: Self-pay

## 2020-12-10 ENCOUNTER — Encounter: Payer: Self-pay | Admitting: Physician Assistant

## 2020-12-10 VITALS — BP 112/70 | HR 57 | Ht 70.0 in | Wt 132.8 lb

## 2020-12-10 DIAGNOSIS — E785 Hyperlipidemia, unspecified: Secondary | ICD-10-CM | POA: Diagnosis not present

## 2020-12-10 DIAGNOSIS — I10 Essential (primary) hypertension: Secondary | ICD-10-CM | POA: Diagnosis not present

## 2020-12-10 DIAGNOSIS — I2510 Atherosclerotic heart disease of native coronary artery without angina pectoris: Secondary | ICD-10-CM

## 2020-12-10 DIAGNOSIS — Z720 Tobacco use: Secondary | ICD-10-CM

## 2020-12-10 MED ORDER — ATORVASTATIN CALCIUM 40 MG PO TABS: 40.0000 mg | ORAL_TABLET | Freq: Every day | ORAL | 3 refills | Status: DC

## 2020-12-10 MED ORDER — LOSARTAN POTASSIUM 25 MG PO TABS
25.0000 mg | ORAL_TABLET | Freq: Every day | ORAL | 3 refills | Status: DC
Start: 2020-12-10 — End: 2021-12-14

## 2020-12-10 MED ORDER — METOPROLOL SUCCINATE ER 50 MG PO TB24: ORAL_TABLET | ORAL | 3 refills | Status: DC

## 2020-12-10 MED ORDER — CLOPIDOGREL BISULFATE 75 MG PO TABS: 75.0000 mg | ORAL_TABLET | Freq: Every day | ORAL | 3 refills | Status: DC

## 2020-12-10 NOTE — Patient Instructions (Signed)
Medication Instructions:  Your physician recommends that you continue on your current medications as directed. Please refer to the Current Medication list given to you today.  *If you need a refill on your cardiac medications before your next appointment, please call your pharmacy*   Lab Work: None ordered  If you have labs (blood work) drawn today and your tests are completely normal, you will receive your results only by: [place] (if you have MyChart) OR A paper copy in the mail If you have any lab test that is abnormal or we need to change your treatment, we will call you to review the results.   Testing/Procedures: None ordered   Follow-Up: At [facility], you and your health needs are our priority.  As part of our continuing mission to provide you with exceptional heart care, we have created designated Provider Care Teams.  These Care Teams include your primary Cardiologist (physician) and Advanced Practice Providers (APPs -  Physician Assistants and Nurse Practitioners) who all work together to provide you with the care you need, when you need it.  We recommend signing up for the patient portal called "MyChart".  Sign up information is provided on this After Visit Summary.  MyChart is used to connect with patients for Virtual Visits (Telemedicine).  Patients are able to view lab/test results, encounter notes, upcoming appointments, etc.  Non-urgent messages can be sent to your provider as well.   To learn more about what you can do with MyChart, go to [email or web address].    Your next appointment:   1 year(s)  The format for your next appointment:   In Person  Provider:   Lauree Chandler, MD or Melina Copa, PA-C   Other Instructions

## 2020-12-15 ENCOUNTER — Telehealth: Payer: Self-pay | Admitting: Physician Assistant

## 2020-12-15 NOTE — Telephone Encounter (Signed)
   Did not yet get fax from [place] but had went through multiple prior CareEverywhere notes - was able to find labs 08/09/20 A1C 6.2, LDL 81, TSH wnl, LFTS ok, BMET wnl K 4.8, Cr 1.0, Hgb 14.4, plt 318.  Would suggest his LDL goal be closer to <70 but patient also reported undergoing workup for concern for malignancy and weight loss with details/etiology not totally clear at this point. Labs are also 5 months old at this point and may have changed since that time with weight loss. Would continue atorvastatin at present dose but consideration can be given in the future if LDL remains >70 in follow-up. [facility] at this time.  Juanice Warburton PA-C

## 2021-10-04 ENCOUNTER — Ambulatory Visit (INDEPENDENT_AMBULATORY_CARE_PROVIDER_SITE_OTHER): Payer: Medicare (Managed Care)

## 2021-10-04 ENCOUNTER — Encounter (HOSPITAL_COMMUNITY): Payer: Self-pay | Admitting: Emergency Medicine

## 2021-10-04 ENCOUNTER — Ambulatory Visit (HOSPITAL_COMMUNITY)
Admission: EM | Admit: 2021-10-04 | Discharge: 2021-10-04 | Disposition: A | Discharge status: Home / Self Care | Payer: Medicare (Managed Care) | Attending: Family Medicine | Admitting: Family Medicine

## 2021-10-04 DIAGNOSIS — R109 Unspecified abdominal pain: Secondary | ICD-10-CM

## 2021-10-04 DIAGNOSIS — R197 Diarrhea, unspecified: Secondary | ICD-10-CM

## 2021-10-04 DIAGNOSIS — R112 Nausea with vomiting, unspecified: Secondary | ICD-10-CM

## 2021-10-04 DIAGNOSIS — R1033 Periumbilical pain: Secondary | ICD-10-CM

## 2021-10-04 HISTORY — DX: Chronic obstructive pulmonary disease, unspecified: J44.9

## 2021-10-04 HISTORY — DX: Acute myocardial infarction, unspecified: I21.9

## 2021-10-04 MED ORDER — FAMOTIDINE 40 MG PO TABS: 40.0000 mg | ORAL_TABLET | Freq: Every day | ORAL | 0 refills | Status: DC

## 2021-10-04 NOTE — Discharge Instructions (Signed)
You were seen today for abdominal pain with nausea and vomiting.  Your xray was normal.  I have sent out a medication to take daily to treat a potential ulcer/stomach irritation.  This is safe to take with your other medications.  Please avoid spicy foods and coffee.  Please go to the ER if you have worsening pain, nausea, or vomiting.  Talk with your doctor about a possible endoscopy as well.

## 2021-10-04 NOTE — ED Provider Notes (Signed)
[place]    CSN: [id number] Arrival date & time: 10/04/21  1341      History   Chief Complaint Chief Complaint  Patient presents with   Emesis   Diarrhea   Abdominal Pain    HPI Austin [name] is a 81 y.o. male.   He is here with abd pain, n/v/diarrhea.  He started with left sided abdominal pain last week Thursday, along with n/v/diarrhea.  It was not that bad and he just had to watch what he ate.  N/v/diarrhea resolved after Friday.  He felt well overall.  Yesterday he ate a good meal, and then restarted up all night with n/v/diarrhea, and more abd pain. Pain is about 4/10 in pain.  Pain comes and goes.  He vomited 8-9 times last night.  He has not had anything to eat today.  He did eat a rice krispy bar today only, and that was able to stay down and no pain.  No blood in his stool, no blood in his vomit.  + fevers/chills while he was vomiting.  He does have "acid".  He was on meds in the past.  He was taking mylanta since last week.   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 Circumflex was treated with 3.0 x 18 mm TAXUS drug-eluting stent on 07/17/03   EYE SURGERY  1971    bilateral   PTCA     And drug-eluting stent to the RCA   RECTAL SURGERY  2008   dr. Rosana Hoes did this surgery       Home Medications    Prior to Admission medications   Medication Sig Start Date End Date Taking? Authorizing Provider  atorvastatin (LIPITOR) 40 MG tablet Take 1 tablet (40 mg total) by mouth at bedtime. 12/10/20   Dunn, Nedra Hai, PA-C  clopidogrel (PLAVIX) 75 MG tablet Take 1 tablet (75 mg total) by mouth daily. 12/10/20   Dunn, Nedra Hai, PA-C  guaiFENesin (MUCINEX) 600 MG 12 hr tablet Take 600 mg by mouth 2 (two) times daily as needed.    [provider]  losartan (COZAAR) 25 MG tablet Take 1 tablet (25 mg total) by mouth daily. 12/10/20   Dunn, Nedra Hai, PA-C  metoprolol succinate (TOPROL-XL) 50 MG 24 hr tablet TAKE 1 TABLET by mouth ( 50 mg) daily. 12/10/20   Dunn, Nedra Hai, PA-C  Multiple Vitamins-Minerals (MULTIVITAMIN WITH MINERALS) tablet Take 1 tablet by mouth daily.    [provider]  nitroGLYCERIN (NITROSTAT) 0.4 MG SL tablet Place 1 tablet (0.4 mg total) under the tongue as directed. 11/10/19   Burnell Blanks, MD  PROAIR HFA 108 (  90 Base) MCG/ACT inhaler Inhale 1 puff into the lungs every 4 (four) hours as needed for shortness of breath. 01/22/16   [provider]  SPIRIVA RESPIMAT 2.5 MCG/ACT AERS Inhale 2 puffs into the lungs daily. 12/23/18   [provider]  SYMBICORT 160-4.5 MCG/ACT inhaler Inhale 2 puffs into the lungs daily. 12/23/18   [provider]  tiotropium (SPIRIVA) 18 MCG inhalation capsule Place 18 mcg into inhaler and inhale daily.    [provider]  triamcinolone cream (KENALOG) 0.1 % 2 (two) times daily. 11/04/20   [provider]    Family History Family History  Problem Relation Age of Onset   Cancer Mother        cervical   Lung disease Father        Black lung    Social History Social History   Tobacco Use   Smoking status: Former    Packs/day: 1.00    Types:  Cigarettes   Smokeless tobacco: Never   Tobacco comments:    Smoked for about 40 years, smokes cigarette every once in a while, maybe a pack a year.  Vaping Use   Vaping Use: Never used  Substance Use Topics   Alcohol use: Yes   Drug use: No     Allergies   Patient has no known allergies.   Review of Systems Review of Systems  Constitutional: Negative.   HENT: Negative.    Respiratory: Negative.    Cardiovascular: Negative.   Gastrointestinal:  Positive for abdominal pain, diarrhea and vomiting.  Genitourinary: Negative.   Musculoskeletal: Negative.   Psychiatric/Behavioral: Negative.       Physical Exam Triage Vital Signs ED Triage Vitals  Enc Vitals Group     BP 10/04/21 1427 135/80     Pulse Rate 10/04/21 1427 85     Resp 10/04/21 1427 18     Temp 10/04/21 1427 98.1 F (36.7 C)     Temp src --      SpO2 10/04/21 1427 97 %     Weight --      Height --      Head Circumference --      Peak Flow --      Pain Score 10/04/21 1424 4     Pain Loc --      Pain Edu? --      Excl. in [place]? --    No data found.  Updated Vital Signs BP 135/80 (BP Location: Left Arm)   Pulse 85   Temp 98.1 F (36.7 C)   Resp 18   SpO2 97%   Visual Acuity Right Eye Distance:   Left Eye Distance:   Bilateral Distance:    Right Eye Near:   Left Eye Near:    Bilateral Near:     Physical Exam Constitutional:      Appearance: He is well-developed.  HENT:     Head: Normocephalic.  Cardiovascular:     Rate and Rhythm: Normal rate and regular rhythm.  Pulmonary:     Effort: Pulmonary effort is normal.     Breath sounds: Normal breath sounds.  Abdominal:     General: Bowel sounds are normal.     Palpations: Abdomen is soft.     Tenderness: There is abdominal tenderness in the periumbilical area. There is no guarding or rebound.  Neurological:     General: No focal deficit present.     Mental Status: He is alert.  Psychiatric:  Mood and Affect: Mood normal.       UC Treatments / Results  Labs (all labs ordered are listed, but only abnormal results are displayed) Labs Reviewed - No data to display  EKG   Radiology DG Abd 1 View  Result Date: 10/04/2021 CLINICAL DATA:  LEFT abdominal pain lateral to umbilicus, nausea, vomiting, diarrhea, question ulcer EXAM: ABDOMEN - 1 VIEW COMPARISON:  None FINDINGS: Nonobstructive bowel gas pattern. No bowel dilatation or bowel wall thickening. Diffuse osseous demineralization live with degenerative disc disease changes at L2-L3. No urinary tract calcification. IMPRESSION: Nonspecific bowel gas pattern. Electronically Signed   By: Lavonia Dana M.D.   On: 10/04/2021 15:10    Procedures Procedures (including critical care time)  Medications Ordered in UC Medications - No data to display  Initial Impression / Assessment and Plan / UC Course  I have reviewed the triage vital signs and the nursing notes.  Pertinent labs & imaging results that were available during my care of the patient were reviewed by me and considered in my medical decision making (see chart for details).   Final Clinical Impressions(s) / UC Diagnoses   Final diagnoses:  Periumbilical abdominal pain  Nausea vomiting and diarrhea     Discharge Instructions      You were seen today for abdominal pain with nausea and vomiting.  Your xray was normal.  I have sent out a medication to take daily to treat a potential ulcer/stomach irritation.  This is safe to take with your other medications.  Please avoid spicy foods and coffee.  Please go to the ER if you have worsening pain, nausea, or vomiting.  Talk with your doctor about a possible endoscopy as well.     ED Prescriptions     Medication Sig Dispense Auth. Provider   famotidine (PEPCID) 40 MG tablet Take 1 tablet (40 mg total) by mouth daily. 15 tablet Rondel Oh, MD      PDMP not reviewed this encounter.   Rondel Oh, MD 10/04/21 1530

## 2021-10-04 NOTE — ED Triage Notes (Signed)
Pt c/o pain in abd left of umbilicus that started last Thursday. Had n/v/d as well. Thinks has an ulcer, denies hx of ulcer. Eating chicken noodle soup, yogurt.

## 2021-11-05 ENCOUNTER — Other Ambulatory Visit: Payer: Self-pay

## 2021-11-05 ENCOUNTER — Emergency Department (HOSPITAL_COMMUNITY): Payer: Medicare (Managed Care)

## 2021-11-05 ENCOUNTER — Inpatient Hospital Stay (HOSPITAL_COMMUNITY)
Admission: EM | Admit: 2021-11-05 | Discharge: 2021-11-07 | DRG: 389 | Disposition: A | Discharge status: Home / Self Care | Payer: Medicare (Managed Care) | Attending: Internal Medicine | Admitting: Internal Medicine

## 2021-11-05 ENCOUNTER — Encounter (HOSPITAL_COMMUNITY): Payer: Self-pay

## 2021-11-05 ENCOUNTER — Inpatient Hospital Stay (HOSPITAL_COMMUNITY): Payer: Medicare (Managed Care)

## 2021-11-05 DIAGNOSIS — E860 Dehydration: Secondary | ICD-10-CM | POA: Diagnosis present

## 2021-11-05 DIAGNOSIS — F32A Depression, unspecified: Secondary | ICD-10-CM | POA: Diagnosis present

## 2021-11-05 DIAGNOSIS — J449 Chronic obstructive pulmonary disease, unspecified: Secondary | ICD-10-CM | POA: Diagnosis present

## 2021-11-05 DIAGNOSIS — Z7951 Long term (current) use of inhaled steroids: Secondary | ICD-10-CM

## 2021-11-05 DIAGNOSIS — I2510 Atherosclerotic heart disease of native coronary artery without angina pectoris: Secondary | ICD-10-CM | POA: Diagnosis present

## 2021-11-05 DIAGNOSIS — Z808 Family history of malignant neoplasm of other organs or systems: Secondary | ICD-10-CM | POA: Diagnosis not present

## 2021-11-05 DIAGNOSIS — R634 Abnormal weight loss: Secondary | ICD-10-CM | POA: Diagnosis present

## 2021-11-05 DIAGNOSIS — E785 Hyperlipidemia, unspecified: Secondary | ICD-10-CM | POA: Diagnosis present

## 2021-11-05 DIAGNOSIS — Z681 Body mass index (BMI) 19 or less, adult: Secondary | ICD-10-CM

## 2021-11-05 DIAGNOSIS — Z955 Presence of coronary angioplasty implant and graft: Secondary | ICD-10-CM | POA: Diagnosis not present

## 2021-11-05 DIAGNOSIS — K529 Noninfective gastroenteritis and colitis, unspecified: Secondary | ICD-10-CM | POA: Diagnosis present

## 2021-11-05 DIAGNOSIS — K56609 Unspecified intestinal obstruction, unspecified as to partial versus complete obstruction: Secondary | ICD-10-CM | POA: Diagnosis present

## 2021-11-05 DIAGNOSIS — I252 Old myocardial infarction: Secondary | ICD-10-CM | POA: Diagnosis not present

## 2021-11-05 DIAGNOSIS — F419 Anxiety disorder, unspecified: Secondary | ICD-10-CM | POA: Diagnosis present

## 2021-11-05 DIAGNOSIS — N179 Acute kidney failure, unspecified: Secondary | ICD-10-CM | POA: Diagnosis present

## 2021-11-05 DIAGNOSIS — R636 Underweight: Secondary | ICD-10-CM | POA: Diagnosis present

## 2021-11-05 DIAGNOSIS — Z8719 Personal history of other diseases of the digestive system: Secondary | ICD-10-CM

## 2021-11-05 DIAGNOSIS — I10 Essential (primary) hypertension: Secondary | ICD-10-CM | POA: Diagnosis not present

## 2021-11-05 DIAGNOSIS — R4182 Altered mental status, unspecified: Secondary | ICD-10-CM | POA: Diagnosis present

## 2021-11-05 DIAGNOSIS — Z79899 Other long term (current) drug therapy: Secondary | ICD-10-CM | POA: Diagnosis not present

## 2021-11-05 DIAGNOSIS — R933 Abnormal findings on diagnostic imaging of other parts of digestive tract: Secondary | ICD-10-CM | POA: Diagnosis present

## 2021-11-05 DIAGNOSIS — K449 Diaphragmatic hernia without obstruction or gangrene: Secondary | ICD-10-CM | POA: Diagnosis not present

## 2021-11-05 DIAGNOSIS — K219 Gastro-esophageal reflux disease without esophagitis: Secondary | ICD-10-CM | POA: Diagnosis present

## 2021-11-05 DIAGNOSIS — Z87891 Personal history of nicotine dependence: Secondary | ICD-10-CM | POA: Diagnosis not present

## 2021-11-05 DIAGNOSIS — K5730 Diverticulosis of large intestine without perforation or abscess without bleeding: Secondary | ICD-10-CM | POA: Diagnosis present

## 2021-11-05 DIAGNOSIS — Z7902 Long term (current) use of antithrombotics/antiplatelets: Secondary | ICD-10-CM | POA: Diagnosis not present

## 2021-11-05 DIAGNOSIS — R112 Nausea with vomiting, unspecified: Secondary | ICD-10-CM

## 2021-11-05 DIAGNOSIS — Z7901 Long term (current) use of anticoagulants: Secondary | ICD-10-CM | POA: Diagnosis not present

## 2021-11-05 DIAGNOSIS — R1013 Epigastric pain: Secondary | ICD-10-CM | POA: Diagnosis present

## 2021-11-05 DIAGNOSIS — R109 Unspecified abdominal pain: Secondary | ICD-10-CM

## 2021-11-05 DIAGNOSIS — F17210 Nicotine dependence, cigarettes, uncomplicated: Secondary | ICD-10-CM | POA: Diagnosis present

## 2021-11-05 LAB — COMPREHENSIVE METABOLIC PANEL
ALT: 20 U/L (ref 0–44)
AST: 18 U/L (ref 15–41)
Albumin: 4.2 g/dL (ref 3.5–5.0)
Alkaline Phosphatase: 68 U/L (ref 38–126)
Anion gap: 10 (ref 5–15)
BUN: 18 mg/dL (ref 8–23)
CO2: 30 mmol/L (ref 22–32)
Calcium: 9.9 mg/dL (ref 8.9–10.3)
Chloride: 100 mmol/L (ref 98–111)
Creatinine, Ser: 1.17 mg/dL (ref 0.61–1.24)
GFR, Estimated: >60 mL/min (ref >60)
Glucose, Bld: 133 mg/dL — ABNORMAL HIGH (ref 70–99)
Potassium: 4.1 mmol/L (ref 3.5–5.1)
Sodium: 140 mmol/L (ref 135–145)
Total Bilirubin: 0.8 mg/dL (ref 0.3–1.2)
Total Protein: 6.9 g/dL (ref 6.5–8.1)

## 2021-11-05 LAB — CBC
HCT: 44 % (ref 39.0–52.0)
Hemoglobin: 15.1 g/dL (ref 13.0–17.0)
MCH: 32.7 pg (ref 26.0–34.0)
MCHC: 34.3 g/dL (ref 30.0–36.0)
MCV: 95.2 fL (ref 80.0–100.0)
Platelets: 322 10*3/uL (ref 150–400)
RBC: 4.62 MIL/uL (ref 4.22–5.81)
RDW: 12.6 % (ref 11.5–15.5)
WBC: 9.7 10*3/uL (ref 4.0–10.5)
nRBC: 0 % (ref 0.0–0.2)

## 2021-11-05 LAB — URINALYSIS, ROUTINE W REFLEX MICROSCOPIC
Bilirubin Urine: NEGATIVE
Glucose, UA: NEGATIVE mg/dL
Hgb urine dipstick: NEGATIVE
Ketones, ur: 5 mg/dL — AB
Leukocytes,Ua: NEGATIVE
Nitrite: NEGATIVE
Protein, ur: NEGATIVE mg/dL
Specific Gravity, Urine: 1.02 (ref 1.005–1.030)
pH: 7 (ref 5.0–8.0)

## 2021-11-05 LAB — MAGNESIUM: Magnesium: 2.2 mg/dL (ref 1.7–2.4)

## 2021-11-05 LAB — LIPASE, BLOOD: Lipase: 28 U/L (ref 11–51)

## 2021-11-05 LAB — TROPONIN I (HIGH SENSITIVITY)
Troponin I (High Sensitivity): 6 ng/L (ref <18)
Troponin I (High Sensitivity): 6 ng/L (ref <18)

## 2021-11-05 MED ORDER — ONDANSETRON HCL 4 MG/2ML IJ SOLN: 4.0000 mg | Freq: Four times a day (QID) | INTRAMUSCULAR | Status: DC | PRN

## 2021-11-05 MED ORDER — KETOROLAC TROMETHAMINE 15 MG/ML IJ SOLN
15.0000 mg | Freq: Once | INTRAMUSCULAR | Status: AC
  Administered 2021-11-05: 15 mg via INTRAVENOUS
  Filled 2021-11-05: qty 1

## 2021-11-05 MED ORDER — METOPROLOL TARTRATE 5 MG/5ML IV SOLN: 5.0000 mg | Freq: Four times a day (QID) | INTRAVENOUS | Status: DC | PRN

## 2021-11-05 MED ORDER — LORAZEPAM 2 MG/ML IJ SOLN
1.0000 mg | Freq: Once | INTRAMUSCULAR | Status: AC
  Administered 2021-11-05: 1 mg via INTRAVENOUS
  Filled 2021-11-05: qty 1

## 2021-11-05 MED ORDER — PANTOPRAZOLE SODIUM 40 MG IV SOLR
40.0000 mg | Freq: Every day | INTRAVENOUS | Status: DC
  Administered 2021-11-05 – 2021-11-06 (×2): 40 mg via INTRAVENOUS
  Filled 2021-11-05 (×2): qty 10

## 2021-11-05 MED ORDER — METOPROLOL TARTRATE 5 MG/5ML IV SOLN: 2.5000 mg | Freq: Four times a day (QID) | INTRAVENOUS | Status: DC | PRN

## 2021-11-05 MED ORDER — LACTATED RINGERS IV SOLN: INTRAVENOUS | Status: DC

## 2021-11-05 MED ORDER — DIATRIZOATE MEGLUMINE & SODIUM 66-10 % PO SOLN
90.0000 mL | Freq: Once | ORAL | Status: AC
  Administered 2021-11-05: 90 mL via NASOGASTRIC
  Filled 2021-11-05: qty 90

## 2021-11-05 MED ORDER — LIDOCAINE VISCOUS HCL 2 % MT SOLN
15.0000 mL | Freq: Once | OROMUCOSAL | Status: AC
  Administered 2021-11-05: 15 mL via OROMUCOSAL
  Filled 2021-11-05: qty 15

## 2021-11-05 MED ORDER — IOHEXOL 350 MG/ML SOLN
75.0000 mL | Freq: Once | INTRAVENOUS | Status: AC | PRN
  Administered 2021-11-05: 75 mL via INTRAVENOUS

## 2021-11-05 MED ORDER — ONDANSETRON HCL 4 MG/2ML IJ SOLN
4.0000 mg | Freq: Once | INTRAMUSCULAR | Status: AC
  Administered 2021-11-05: 4 mg via INTRAVENOUS
  Filled 2021-11-05: qty 2

## 2021-11-05 MED ORDER — SODIUM CHLORIDE 0.9 % IV BOLUS
1000.0000 mL | Freq: Once | INTRAVENOUS | Status: AC
  Administered 2021-11-05: 1000 mL via INTRAVENOUS

## 2021-11-05 MED ORDER — ENOXAPARIN SODIUM 40 MG/0.4ML IJ SOSY
40.0000 mg | PREFILLED_SYRINGE | INTRAMUSCULAR | Status: DC
  Administered 2021-11-06: 40 mg via SUBCUTANEOUS
  Filled 2021-11-05: qty 0.4

## 2021-11-05 NOTE — Consult Note (Signed)
CC/Reason for consult: Small bowel obstruction Requesting physician: Dr. Campbell Stall  HPI: Austin [name] is an 81 y.o. male hx CAD, HTN, GERD, presented with significant nausea and vomiting to the degree that nothing has taste anymore.  This all began in the last day or 2.  He has never had anything quite this dramatic before.  He reports that the distention he came in with has almost resolved after large-volume emesis here.  He denies any abdominal pain at present.  He has previously followed with Dr. Watt Climes who had done his endoscopies and colonoscopies for surveillance.  He has most recently had a colonoscopy completed in the last 5 years through the [facility] [place] [facility].  He does report a history of polyps but was told that his most recent colonoscopy was clear.  He reports he had shrapnel removed from his abdomen while in [place] but does not believe this required any sort of large incision on his abdomen.  He otherwise denies any known abdominal surgical history.  He denies any current use of tobacco, alcohol.  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  Past Medical History:  Diagnosis Date   CAD (coronary artery disease)    COPD (chronic obstructive pulmonary disease) (HCC)    Dyspnea on exertion    GERD (gastroesophageal reflux disease)    Hyperlipidemia    MI (myocardial infarction) ([place])    Rectal bleeding    Tobacco abuse    Unspecified essential hypertension     Past Surgical History:  Procedure Laterality Date   CORONARY ANGIOPLASTY WITH STENT PLACEMENT     Drug eluting stent placed to the first obtuse marginal with a 2.58 mm cypher drug eluting stent. Circumflex was treated with 3.0 x 18 mm TAXUS drug-eluting stent on 07/17/03   EYE SURGERY  1971   bilateral   PTCA     And drug-eluting stent to the RCA   RECTAL SURGERY  2008   dr. Rosana Hoes did this surgery  Hemorrhoidectomy 2008 -Dr. Lennie Hummer  Family  History  Problem Relation Age of Onset   Cancer Mother        cervical   Lung disease Father        Black lung    Social:  reports that he has quit smoking. His smoking use included cigarettes. He smoked an average of 1 pack per day. He has never used smokeless tobacco. He reports current alcohol use. He reports that he does not use drugs.  Allergies: No Known Allergies  Medications: I have reviewed the patient's current medications.  Results for orders placed or performed during the hospital encounter of 11/05/21 (from the past 48 hour(s))  Lipase, blood     Status: None   Collection Time: 11/05/21  4:00 PM  Result Value Ref Range   Lipase 28 11 - 51 U/L    Comment: Performed at [facility], [place] [address] [place], [place] [address]  Comprehensive metabolic panel     Status: Abnormal   Collection Time: 11/05/21  4:00 PM  Result Value Ref Range   Sodium 140 135 - 145 mmol/L   Potassium 4.1 3.5 - 5.1 mmol/L   Chloride 100 98 - 111 mmol/L   CO2 30 22 - 32 mmol/L   Glucose, Bld 133 (H) 70 - 99 mg/dL    Comment: Glucose reference range applies only to samples taken after fasting for at least 8  hours.   BUN 18 8 - 23 mg/dL   Creatinine, Ser 1.17 0.61 - 1.24 mg/dL   Calcium 9.9 8.9 - 10.3 mg/dL   Total Protein 6.9 6.5 - 8.1 g/dL   Albumin 4.2 3.5 - 5.0 g/dL   AST 18 15 - 41 U/L   ALT 20 0 - 44 U/L   Alkaline Phosphatase 68 38 - 126 U/L   Total Bilirubin 0.8 0.3 - 1.2 mg/dL   GFR, Estimated >60 >60 mL/min    Comment: (NOTE) Calculated using the CKD-EPI Creatinine Equation (2021)    Anion gap 10 5 - 15    Comment: Performed at [facility] [address] [place] [address]  CBC     Status: None   Collection Time: 11/05/21  4:00 PM  Result Value Ref Range   WBC 9.7 4.0 - 10.5 K/uL   RBC 4.62 4.22 - 5.81 MIL/uL   Hemoglobin 15.1 13.0 - 17.0 g/dL   HCT 44.0 39.0 - 52.0 %   MCV 95.2 80.0 - 100.0 fL   MCH 32.7 26.0 - 34.0 pg   MCHC 34.3 30.0 - 36.0 g/dL    RDW 12.6 11.5 - 15.5 %   Platelets 322 150 - 400 K/uL   nRBC 0.0 0.0 - 0.2 %    Comment: Performed at [facility], [place] [address], [place], [place] [address]  Magnesium     Status: None   Collection Time: 11/05/21  4:00 PM  Result Value Ref Range   Magnesium 2.2 1.7 - 2.4 mg/dL    Comment: Performed at [facility], [place] [address], [place], [place] [address]  Troponin I (High Sensitivity)     Status: None   Collection Time: 11/05/21  4:00 PM  Result Value Ref Range   Troponin I (High Sensitivity) 6 <18 ng/L    Comment: (NOTE) Elevated high sensitivity troponin I (hsTnI) values and significant  changes across serial measurements may suggest ACS but many other  chronic and acute conditions are known to elevate hsTnI results.  Refer to the "Links" section for chest pain algorithms and additional  guidance. Performed at [facility], [place] [address], [place], [place] [address]   Urinalysis, Routine w reflex microscopic Urine, Clean Catch     Status: Abnormal   Collection Time: 11/05/21  4:12 PM  Result Value Ref Range   Color, Urine AMBER (A) YELLOW    Comment: BIOCHEMICALS MAY BE AFFECTED BY COLOR   APPearance CLOUDY (A) CLEAR   Specific Gravity, Urine 1.020 1.005 - 1.030   pH 7.0 5.0 - 8.0   Glucose, UA NEGATIVE NEGATIVE mg/dL   Hgb urine dipstick NEGATIVE NEGATIVE   Bilirubin Urine NEGATIVE NEGATIVE   Ketones, ur 5 (A) NEGATIVE mg/dL   Protein, ur NEGATIVE NEGATIVE mg/dL   Nitrite NEGATIVE NEGATIVE   Leukocytes,Ua NEGATIVE NEGATIVE    Comment: Performed at [facility] [address] [place], [place] [address]    CT ABDOMEN PELVIS W CONTRAST  Result Date: 11/05/2021 CLINICAL DATA:  Abdominal pain. EXAM: CT ABDOMEN AND PELVIS WITH CONTRAST TECHNIQUE: Multidetector CT imaging of the abdomen and pelvis was performed using the standard protocol following bolus administration of intravenous contrast. RADIATION DOSE REDUCTION: This exam was performed  according to the departmental dose-optimization program which includes automated exposure control, adjustment of the mA and/or kV according to patient size and/or use of iterative reconstruction technique. CONTRAST:  10m OMNIPAQUE IOHEXOL 350 MG/ML  SOLN COMPARISON:  None Available. FINDINGS: Lower chest: Moderate emphysema. Hepatobiliary: 2 few mm hypoattenuated lesions in the liver, too small to be actually characterize by CT. Decompressed gallbladder. Pancreas: Unremarkable. No pancreatic ductal dilatation or surrounding inflammatory changes. Spleen: Normal in size without focal abnormality. Adrenals/Urinary Tract: Normal adrenal glands. 2.2 cm renal cyst in the midpole region of the right kidney. Smaller less than 1 cm probable cysts in the lower pole of the left kidney. Normal urinary bladder. Stomach/Bowel: Normal appearance of the stomach. Fluid-filled distended small bowel loops measure up to 4 cm with a transitional point in the right mid abdomen. Diffuse left colonic diverticulosis with associated long segment circumferential mucosal wall thickening of the descending and sigmoid colon suggestive of chronic colitis. No significant pericolonic inflammatory changes. Vascular/Lymphatic: Aortic atherosclerosis. No enlarged abdominal or pelvic lymph nodes. Reproductive: Normal sized of the prostate gland. Other: No abdominal wall hernia or abnormality. No abdominopelvic ascites. Musculoskeletal: Spondylosis of the lumbosacral spine. IMPRESSION: 1. Small bowel obstruction with transitional point in the right mid abdomen. 2. Diffuse left colonic diverticulosis with associated long segment circumferential mucosal wall thickening of the descending and sigmoid colon suggestive of chronic colitis. No significant pericolonic inflammatory changes. 3. 2 few mm hypoattenuated lesions in the liver, too small to be actually characterize by CT. 4. Benign right renal cyst. 5. Moderate emphysema. Aortic Atherosclerosis  (ICD10-I70.0) and Emphysema (ICD10-J43.9). Electronically Signed   By: Fidela Salisbury M.D.   On: 11/05/2021 19:24    ROS - all of the below systems have been reviewed with the patient and positives are indicated with bold text General: chills, fever or night sweats Eyes: blurry vision or double vision ENT: epistaxis or sore throat Allergy/Immunology: itchy/watery eyes or nasal congestion Hematologic/Lymphatic: bleeding problems, blood clots or swollen lymph nodes Endocrine: temperature intolerance or unexpected weight changes Breast: new or changing breast lumps or nipple discharge Resp: cough, shortness of breath, or wheezing CV: chest pain or dyspnea on exertion GI: as per HPI GU: dysuria, trouble voiding, or hematuria MSK: joint pain or joint stiffness Neuro: TIA or stroke symptoms Derm: pruritus and skin lesion changes Psych: anxiety and depression  PE Blood pressure (!) 158/71, pulse 85, temperature 98.1 F (36.7 C), temperature source Oral, resp. rate 18, height '5\' 10"'$  (1.778 m), weight 57.2 kg, SpO2 96 %. Constitutional: NAD; conversant Eyes: Moist conjunctiva Lungs: Normal respiratory effort CV: RRR; no palpable thrills; no pitting edema GI: Abd soft, nontender, mildly distended  Psychiatric: Appropriate affect; alert and oriented x3  Results for orders placed or performed during the hospital encounter of 11/05/21 (from the past 48 hour(s))  Lipase, blood     Status: None   Collection Time: 11/05/21  4:00 PM  Result Value Ref Range   Lipase 28 11 - 51 U/L    Comment: Performed at [facility], [address], [place], [place] [address]  Comprehensive metabolic panel     Status: Abnormal   Collection Time: 11/05/21  4:00 PM  Result Value Ref Range   Sodium 140 135 - 145 mmol/L   Potassium 4.1 3.5 - 5.1 mmol/L   Chloride 100 98 - 111 mmol/L   CO2 30 22 - 32 mmol/L   Glucose, Bld 133 (H) 70 - 99 mg/dL    Comment: Glucose reference range applies only to  samples taken after fasting for at least 8 hours.   BUN 18 8 - 23 mg/dL   Creatinine, Ser 1.17 0.61 - 1.24 mg/dL  Calcium 9.9 8.9 - 10.3 mg/dL   Total Protein 6.9 6.5 - 8.1 g/dL   Albumin 4.2 3.5 - 5.0 g/dL   AST 18 15 - 41 U/L   ALT 20 0 - 44 U/L   Alkaline Phosphatase 68 38 - 126 U/L   Total Bilirubin 0.8 0.3 - 1.2 mg/dL   GFR, Estimated >60 >60 mL/min    Comment: (NOTE) Calculated using the CKD-EPI Creatinine Equation (2021)    Anion gap 10 5 - 15    Comment: Performed at [facility] [address], [place] [address]  CBC     Status: None   Collection Time: 11/05/21  4:00 PM  Result Value Ref Range   WBC 9.7 4.0 - 10.5 K/uL   RBC 4.62 4.22 - 5.81 MIL/uL   Hemoglobin 15.1 13.0 - 17.0 g/dL   HCT 44.0 39.0 - 52.0 %   MCV 95.2 80.0 - 100.0 fL   MCH 32.7 26.0 - 34.0 pg   MCHC 34.3 30.0 - 36.0 g/dL   RDW 12.6 11.5 - 15.5 %   Platelets 322 150 - 400 K/uL   nRBC 0.0 0.0 - 0.2 %    Comment: Performed at [facility], [place] [address] [place], [place] [address]  Magnesium     Status: None   Collection Time: 11/05/21  4:00 PM  Result Value Ref Range   Magnesium 2.2 1.7 - 2.4 mg/dL    Comment: Performed at [facility], [place] [address], [place], [place] [address]  Troponin I (High Sensitivity)     Status: None   Collection Time: 11/05/21  4:00 PM  Result Value Ref Range   Troponin I (High Sensitivity) 6 <18 ng/L    Comment: (NOTE) Elevated high sensitivity troponin I (hsTnI) values and significant  changes across serial measurements may suggest ACS but many other  chronic and acute conditions are known to elevate hsTnI results.  Refer to the "Links" section for chest pain algorithms and additional  guidance. Performed at [facility], [place] [address], [place], [place] [address]   Urinalysis, Routine w reflex microscopic Urine, Clean Catch     Status: Abnormal   Collection Time: 11/05/21  4:12 PM  Result Value Ref Range   Color, Urine AMBER  (A) YELLOW    Comment: BIOCHEMICALS MAY BE AFFECTED BY COLOR   APPearance CLOUDY (A) CLEAR   Specific Gravity, Urine 1.020 1.005 - 1.030   pH 7.0 5.0 - 8.0   Glucose, UA NEGATIVE NEGATIVE mg/dL   Hgb urine dipstick NEGATIVE NEGATIVE   Bilirubin Urine NEGATIVE NEGATIVE   Ketones, ur 5 (A) NEGATIVE mg/dL   Protein, ur NEGATIVE NEGATIVE mg/dL   Nitrite NEGATIVE NEGATIVE   Leukocytes,Ua NEGATIVE NEGATIVE    Comment: Performed at [facility] [address], [place], [place] [address]    CT ABDOMEN PELVIS W CONTRAST  Result Date: 11/05/2021 CLINICAL DATA:  Abdominal pain. EXAM: CT ABDOMEN AND PELVIS WITH CONTRAST TECHNIQUE: Multidetector CT imaging of the abdomen and pelvis was performed using the standard protocol following bolus administration of intravenous contrast. RADIATION DOSE REDUCTION: This exam was performed according to the departmental dose-optimization program which includes automated exposure control, adjustment of the mA and/or kV according to patient size and/or use of iterative reconstruction technique. CONTRAST:  51m OMNIPAQUE IOHEXOL 350 MG/ML SOLN COMPARISON:  None Available. FINDINGS: Lower chest: Moderate emphysema. Hepatobiliary: 2 few mm hypoattenuated lesions in the liver, too small  to be actually characterize by CT. Decompressed gallbladder. Pancreas: Unremarkable. No pancreatic ductal dilatation or surrounding inflammatory changes. Spleen: Normal in size without focal abnormality. Adrenals/Urinary Tract: Normal adrenal glands. 2.2 cm renal cyst in the midpole region of the right kidney. Smaller less than 1 cm probable cysts in the lower pole of the left kidney. Normal urinary bladder. Stomach/Bowel: Normal appearance of the stomach. Fluid-filled distended small bowel loops measure up to 4 cm with a transitional point in the right mid abdomen. Diffuse left colonic diverticulosis with associated long segment circumferential mucosal wall thickening of the descending and  sigmoid colon suggestive of chronic colitis. No significant pericolonic inflammatory changes. Vascular/Lymphatic: Aortic atherosclerosis. No enlarged abdominal or pelvic lymph nodes. Reproductive: Normal sized of the prostate gland. Other: No abdominal wall hernia or abnormality. No abdominopelvic ascites. Musculoskeletal: Spondylosis of the lumbosacral spine. IMPRESSION: 1. Small bowel obstruction with transitional point in the right mid abdomen. 2. Diffuse left colonic diverticulosis with associated long segment circumferential mucosal wall thickening of the descending and sigmoid colon suggestive of chronic colitis. No significant pericolonic inflammatory changes. 3. 2 few mm hypoattenuated lesions in the liver, too small to be actually characterize by CT. 4. Benign right renal cyst. 5. Moderate emphysema. Aortic Atherosclerosis (ICD10-I70.0) and Emphysema (ICD10-J43.9). Electronically Signed   By: Fidela Salisbury M.D.   On: 11/05/2021 19:24     A/P: Austin [name] is an 81 y.o. male with hx CAD, HTN, GERD, with apparent small bowel obstruction  -Admit to ward for ongoing management -NPO, MIVF -NG tube to low intermittent wall suction; "SBO protocol" ordered -We will follow with you -Ok for chemical and mechanical dvt prophylaxis from our perspective as well  I spent a total of 65 minutes in both face-to-face and non-face-to-face activities, excluding procedures performed, for this visit on the date of this encounter.  Nadeen Landau, [facility], [place]

## 2021-11-05 NOTE — H&P (Incomplete)
History and Physical  Austin [name] EHM:[id number] DOB: 1940-08-15 DOA: 11/05/2021  Referring physician: Dr. Pearline Cables, Fernando Salinas  PCP: [facility] [place]  Outpatient Specialists: Cardiology. Patient coming from: Home.  Chief Complaint: Nausea vomiting and diarrhea  HPI: Austin [name] is a 81 y.o. male with medical history significant for COPD, hypertension, coronary artery disease, chronic anxiety/depression, GERD, hemorrhoids, who presented to [facility] ED due to worsening nausea vomiting and diarrhea.  Endorses symptoms onset more than a month ago.  Has been self-medicating with over-the-counter remedies.  Symptoms are associated with generalized abdominal pain.  In the ED CT abdomen and pelvis showed small bowel obstruction.  No prior abdominal surgeries.  Endorses 20 pound weight loss in 1 month due to inability to keep anything down.  Has not seen a gastroenterologist for this issue.  EDP discussed the case with general surgery.  An NG tube was placed.  Gastrografin was administered.  Later the patient pulled out his NG tube.  TRH service, was asked to admit for further evaluation and management.  ED Course: Tmax 98.6.  BP 175/80, pulse 66, respiration rate 16, O2 saturation 94% on room air.  Labs today remarkable for WBC 16.6.  Creatinine 1.44 with GFR 49.  Review of Systems: Review of systems as noted in the HPI. All other systems reviewed and are negative.   Past Medical History:  Diagnosis Date   CAD (coronary artery disease)    COPD (chronic obstructive pulmonary disease) (HCC)    Dyspnea on exertion    GERD (gastroesophageal reflux disease)    Hyperlipidemia    MI (myocardial infarction) ([place])    Rectal bleeding    Tobacco abuse    Unspecified essential hypertension    Past Surgical History:  Procedure Laterality Date   CORONARY ANGIOPLASTY WITH STENT PLACEMENT     Drug eluting stent placed to the first obtuse marginal with a 2.58 mm cypher drug eluting stent. Circumflex was  treated with 3.0 x 18 mm TAXUS drug-eluting stent on 07/17/03   EYE SURGERY  1971   bilateral   PTCA     And drug-eluting stent to the RCA   RECTAL SURGERY  2008   dr. Rosana Hoes did this surgery    Social History:  reports that he has quit smoking. His smoking use included cigarettes. He smoked an average of 1 pack per day. He has never used smokeless tobacco. He reports current alcohol use. He reports that he does not use drugs.   No Known Allergies  Family History  Problem Relation Age of Onset   Cancer Mother        cervical   Lung disease Father        Black lung      Prior to Admission medications   Medication Sig Start Date End Date Taking? Authorizing Provider  atorvastatin (LIPITOR) 40 MG tablet Take 1 tablet (40 mg total) by mouth at bedtime. 12/10/20   Dunn, Nedra Hai, PA-C  clopidogrel (PLAVIX) 75 MG tablet Take 1 tablet (75 mg total) by mouth daily. 12/10/20   Dunn, Nedra Hai, PA-C  famotidine (PEPCID) 40 MG tablet Take 1 tablet (40 mg total) by mouth daily. 10/04/21   Piontek, Junie Panning, MD  guaiFENesin (MUCINEX) 600 MG 12 hr tablet Take 600 mg by mouth 2 (two) times daily as needed.    [provider]  losartan (COZAAR) 25 MG tablet Take 1 tablet (25 mg total) by mouth daily. 12/10/20   Charlie Pitter, PA-C  metoprolol succinate (TOPROL-XL) 50 MG 24 hr tablet TAKE 1 TABLET by mouth ( 50 mg) daily. 12/10/20   Dunn, Nedra Hai, PA-C  Multiple Vitamins-Minerals (MULTIVITAMIN WITH MINERALS) tablet Take 1 tablet by mouth daily.    [provider]  nitroGLYCERIN (NITROSTAT) 0.4 MG SL tablet Place 1 tablet (0.4 mg total) under the tongue as directed. 11/10/19   Burnell Blanks, MD  PROAIR HFA [phone] Base) MCG/ACT inhaler Inhale 1 puff into the lungs every 4 (four) hours as needed for shortness of breath. 01/22/16   [provider]  SPIRIVA RESPIMAT 2.5 MCG/ACT AERS Inhale 2 puffs into the lungs daily. 12/23/18   [provider]  SYMBICORT 160-4.5  MCG/ACT inhaler Inhale 2 puffs into the lungs daily. 12/23/18   [provider]  tiotropium (SPIRIVA) 18 MCG inhalation capsule Place 18 mcg into inhaler and inhale daily.    [provider]  triamcinolone cream (KENALOG) 0.1 % 2 (two) times daily. 11/04/20   [provider]    Physical Exam: BP (!) 158/71   Pulse 85   Temp 98.1 F (36.7 C) (Oral)   Resp 18   Ht '5\' 10"'$  (1.778 m)   Wt 57.2 kg   SpO2 96%   BMI 18.08 kg/m   General: 81 y.o. year-old male well developed well nourished in no acute distress.  Alert and oriented x3. Cardiovascular: Regular rate and rhythm with no rubs or gallops.  No thyromegaly or JVD noted.  No lower extremity edema. 2/4 pulses in all 4 extremities. Respiratory: Clear to auscultation with no wheezes or rales. Good inspiratory effort. Abdomen: Soft nontender nondistended with normal bowel sounds x4 quadrants. Muskuloskeletal: No cyanosis, clubbing or edema noted bilaterally Neuro: CN II-XII intact, strength, sensation, reflexes Skin: No ulcerative lesions noted or rashes Psychiatry: Judgement and insight appear normal. Mood is appropriate for condition and setting          Labs on Admission:  Basic Metabolic Panel: Recent Labs  Lab 11/05/21 1600  NA 140  K 4.1  CL 100  CO2 30  GLUCOSE 133*  BUN 18  CREATININE 1.17  CALCIUM 9.9  MG 2.2   Liver Function Tests: Recent Labs  Lab 11/05/21 1600  AST 18  ALT 20  ALKPHOS 68  BILITOT 0.8  PROT 6.9  ALBUMIN 4.2   Recent Labs  Lab 11/05/21 1600  LIPASE 28   No results for input(s): "AMMONIA" in the last 168 hours. CBC: Recent Labs  Lab 11/05/21 1600  WBC 9.7  HGB 15.1  HCT 44.0  MCV 95.2  PLT 322   Cardiac Enzymes: No results for input(s): "CKTOTAL", "CKMB", "CKMBINDEX", "TROPONINI" in the last 168 hours.  BNP (last 3 results) No results for input(s): "BNP" in the last 8760 hours.  ProBNP (last 3 results) No results for input(s): "PROBNP" in the  last 8760 hours.  CBG: No results for input(s): "GLUCAP" in the last 168 hours.  Radiological Exams on Admission: CT ABDOMEN PELVIS W CONTRAST  Result Date: 11/05/2021 CLINICAL DATA:  Abdominal pain. EXAM: CT ABDOMEN AND PELVIS WITH CONTRAST TECHNIQUE: Multidetector CT imaging of the abdomen and pelvis was performed using the standard protocol following bolus administration of intravenous contrast. RADIATION DOSE REDUCTION: This exam was performed according to the departmental dose-optimization program which includes automated exposure control, adjustment of the mA and/or kV according to patient size and/or use of iterative reconstruction technique. CONTRAST:  68m OMNIPAQUE IOHEXOL 350 MG/ML SOLN COMPARISON:  None Available. FINDINGS: Lower  chest: Moderate emphysema. Hepatobiliary: 2 few mm hypoattenuated lesions in the liver, too small to be actually characterize by CT. Decompressed gallbladder. Pancreas: Unremarkable. No pancreatic ductal dilatation or surrounding inflammatory changes. Spleen: Normal in size without focal abnormality. Adrenals/Urinary Tract: Normal adrenal glands. 2.2 cm renal cyst in the midpole region of the right kidney. Smaller less than 1 cm probable cysts in the lower pole of the left kidney. Normal urinary bladder. Stomach/Bowel: Normal appearance of the stomach. Fluid-filled distended small bowel loops measure up to 4 cm with a transitional point in the right mid abdomen. Diffuse left colonic diverticulosis with associated long segment circumferential mucosal wall thickening of the descending and sigmoid colon suggestive of chronic colitis. No significant pericolonic inflammatory changes. Vascular/Lymphatic: Aortic atherosclerosis. No enlarged abdominal or pelvic lymph nodes. Reproductive: Normal sized of the prostate gland. Other: No abdominal wall hernia or abnormality. No abdominopelvic ascites. Musculoskeletal: Spondylosis of the lumbosacral spine. IMPRESSION: 1. Small bowel  obstruction with transitional point in the right mid abdomen. 2. Diffuse left colonic diverticulosis with associated long segment circumferential mucosal wall thickening of the descending and sigmoid colon suggestive of chronic colitis. No significant pericolonic inflammatory changes. 3. 2 few mm hypoattenuated lesions in the liver, too small to be actually characterize by CT. 4. Benign right renal cyst. 5. Moderate emphysema. Aortic Atherosclerosis (ICD10-I70.0) and Emphysema (ICD10-J43.9). Electronically Signed   By: Fidela Salisbury M.D.   On: 11/05/2021 19:24    EKG: I independently viewed the EKG done and my findings are as followed: Normal sinus rhythm rate of 91.  Nonspecific ST-T changes.  QTc 408.  Assessment/Plan Present on Admission:  Small bowel obstruction (HCC)  Principal Problem:   Small bowel obstruction (HCC)  Small bowel obstruction, seen on CT scan, unknown trigger No prior abdominal surgeries. NG tube placed in the ED, oral contrast administered. Repeat abdominal x-ray to reassess in the morning IV fluids Replace electrolytes as indicated Mobilize as tolerated General surgery consulted and following  Intractable nausea vomiting, unintentional weight loss Continue daily IV Protonix while n.p.o. GI consult  AKI, likely prerenal from dehydration Dehydration in the setting of intractable nausea and vomiting. Monitor urine output Avoid nephrotoxic agents and dehydration. Repeat renal panel in the morning  Leukocytosis, suspect reactive in the setting of small bowel obstruction Presented with WBC 16.6 K No evidence of active infective process  GERD IV PPI daily   DVT prophylaxis: Subcu Lovenox daily  Code Status: Full code  Family Communication: Updated the patient's son at bedside  Disposition Plan: Admitted to telemetry surgical unit  Consults called: General surgery, GI Dr. Tarri Glenn.  Admission status: Inpatient status.   Status is: Inpatient The  patient requires at least 2 midnights for further evaluation and treatment of present condition.   Kayleen Memos MD [facility] Pager [phone]  If 7PM-7AM, please contact night-coverage [email or web address] Password [facility]  11/05/2021, 9:03 PM

## 2021-11-05 NOTE — ED Triage Notes (Signed)
Pt c/o abdominal pain, nausea, and vomiting for the past two days. Reports hx of diverticulitis.

## 2021-11-05 NOTE — ED Provider Notes (Addendum)
[facility] EMERGENCY DEPARTMENT Provider Note   CSN: [id number] Arrival date & time: 11/05/21  1529     History  Chief Complaint  Patient presents with   Abdominal Pain   Nausea    Austin [name] is a 81 y.o. male.  Pt is a 81 yo male with PMH CAD, MI, hyperlipidemia, COPD, and tobacco use presenting with wife and son for abdominal pain. Pt admits to abdominal pain near the umbilicus, intermittent, gnawing, without radiation, with associate nausea/vomiting/diarrhea, for one month. Admits to 40 lb weight loss this month and dehydration. Went to urgent care who did abdominal xray demonstrating no acute process. Thought it may be diverticulitis and sent pt home on antibiotics. No improvement. Pt also admits to hx of serve acid reflux symptoms. Sees GI doc at [place]. Is noncompliant with recommended meds bc pt does not like to take too many medications.   The history is provided by the patient. No language interpreter was used.  Abdominal Pain Associated symptoms: diarrhea, nausea and vomiting   Associated symptoms: no chest pain, no chills, no cough, no dysuria, no fever, no hematuria, no shortness of breath and no sore throat        Home Medications Prior to Admission medications   Medication Sig Start Date End Date Taking? Authorizing Provider  atorvastatin (LIPITOR) 40 MG tablet Take 1 tablet (40 mg total) by mouth at bedtime. 12/10/20   Dunn, Nedra Hai, PA-C  clopidogrel (PLAVIX) 75 MG tablet Take 1 tablet (75 mg total) by mouth daily. 12/10/20   Dunn, Nedra Hai, PA-C  famotidine (PEPCID) 40 MG tablet Take 1 tablet (40 mg total) by mouth daily. 10/04/21   Piontek, Junie Panning, MD  guaiFENesin (MUCINEX) 600 MG 12 hr tablet Take 600 mg by mouth 2 (two) times daily as needed.    [provider]  losartan (COZAAR) 25 MG tablet Take 1 tablet (25 mg total) by mouth daily. 12/10/20   Dunn, Nedra Hai, PA-C  metoprolol succinate (TOPROL-XL) 50 MG 24 hr tablet TAKE 1 TABLET by  mouth ( 50 mg) daily. 12/10/20   Dunn, Nedra Hai, PA-C  Multiple Vitamins-Minerals (MULTIVITAMIN WITH MINERALS) tablet Take 1 tablet by mouth daily.    [provider]  nitroGLYCERIN (NITROSTAT) 0.4 MG SL tablet Place 1 tablet (0.4 mg total) under the tongue as directed. 11/10/19   Burnell Blanks, MD  PROAIR HFA [phone] Base) MCG/ACT inhaler Inhale 1 puff into the lungs every 4 (four) hours as needed for shortness of breath. 01/22/16   [provider]  SPIRIVA RESPIMAT 2.5 MCG/ACT AERS Inhale 2 puffs into the lungs daily. 12/23/18   [provider]  SYMBICORT 160-4.5 MCG/ACT inhaler Inhale 2 puffs into the lungs daily. 12/23/18   [provider]  tiotropium (SPIRIVA) 18 MCG inhalation capsule Place 18 mcg into inhaler and inhale daily.    [provider]  triamcinolone cream (KENALOG) 0.1 % 2 (two) times daily. 11/04/20   [provider]      Allergies    Patient has no known allergies.    Review of Systems   Review of Systems  Constitutional:  Positive for unexpected weight change. Negative for chills and fever.  HENT:  Negative for ear pain and sore throat.   Eyes:  Negative for pain and visual disturbance.  Respiratory:  Negative for cough and shortness of breath.   Cardiovascular:  Negative for chest pain and palpitations.  Gastrointestinal:  Positive for abdominal pain,  diarrhea, nausea and vomiting.  Genitourinary:  Negative for dysuria and hematuria.  Musculoskeletal:  Negative for arthralgias and back pain.  Skin:  Negative for color change and rash.  Neurological:  Negative for seizures and syncope.  All other systems reviewed and are negative.   Physical Exam Updated Vital Signs BP (!) 158/71   Pulse 85   Temp 98.1 F (36.7 C) (Oral)   Resp 18   Ht '5\' 10"'$  (1.778 m)   Wt 57.2 kg   SpO2 96%   BMI 18.08 kg/m  Physical Exam Vitals and nursing note reviewed.  Constitutional:      General: He is not in acute  distress.    Appearance: He is underweight.  HENT:     Head: Normocephalic and atraumatic.     Mouth/Throat:     Mouth: Mucous membranes are dry.  Eyes:     Conjunctiva/sclera: Conjunctivae normal.  Cardiovascular:     Rate and Rhythm: Normal rate and regular rhythm.     Heart sounds: No murmur heard. Pulmonary:     Effort: Pulmonary effort is normal. No respiratory distress.     Breath sounds: Normal breath sounds.  Abdominal:     Palpations: Abdomen is soft.     Tenderness: There is no abdominal tenderness.  Musculoskeletal:        General: No swelling.     Cervical back: Neck supple.  Skin:    General: Skin is warm and dry.     Capillary Refill: Capillary refill takes less than 2 seconds.     Comments: Skin tenting  Neurological:     Mental Status: He is alert.  Psychiatric:        Mood and Affect: Mood normal.     ED Results / Procedures / Treatments   Labs (all labs ordered are listed, but only abnormal results are displayed) Labs Reviewed  COMPREHENSIVE METABOLIC PANEL - Abnormal; Notable for the following components:      Result Value   Glucose, Bld 133 (*)    All other components within normal limits  URINALYSIS, ROUTINE W REFLEX MICROSCOPIC - Abnormal; Notable for the following components:   Color, Urine AMBER (*)    APPearance CLOUDY (*)    Ketones, ur 5 (*)    All other components within normal limits  LIPASE, BLOOD  CBC  MAGNESIUM  CBC  CREATININE, SERUM  CBC  COMPREHENSIVE METABOLIC PANEL  MAGNESIUM  PHOSPHORUS  TROPONIN I (HIGH SENSITIVITY)  TROPONIN I (HIGH SENSITIVITY)    EKG EKG Interpretation  Date/Time:  Saturday November 05 2021 15:37:12 EDT Ventricular Rate:  91 PR Interval:  148 QRS Duration: 74 QT Interval:  332 QTC Calculation: 408 R Axis:   82 Text Interpretation: Normal sinus rhythm ST & T wave abnormality, consider inferior ischemia Abnormal ECG When compared with ECG of 05-Nov-2008 23:47, PREVIOUS ECG IS PRESENT Confirmed  by Campbell Stall ([id number]) on 0/62/3762 5:10:44 PM  Radiology DG Abdomen 1 View  Result Date: 11/05/2021 CLINICAL DATA:  NG-tube EXAM: ABDOMEN - 1 VIEW COMPARISON:  Abdominal x-ray 11/05/2021 FINDINGS: Nasogastric tube tip is now at the level of the distal stomach. Dilated small bowel loops are again seen. Lung bases are clear. IMPRESSION: Nasogastric tube tip now at the level of the distal stomach. Electronically Signed   By: Ronney Asters M.D.   On: 11/05/2021 21:59   DG Abd Portable 1V-Small Bowel Protocol-Position Verification  Result Date: 11/05/2021 CLINICAL DATA:  NG tube placement EXAM: PORTABLE ABDOMEN -  1 VIEW COMPARISON:  CT abdomen and pelvis earlier today FINDINGS: Interval placement of enteric tube. The enteric tube is looped in the stomach with tip directed superiorly likely within the distal esophagus. Repositioning is recommended prior to use. Partially visualized dilated loops of small bowel compatible with obstruction as seen on CT earlier today. IMPRESSION: Enteric tube tip directed superiorly within the distal esophagus. Recommend repositioning prior to use. Dilated loops of small bowel compatible with obstruction. Electronically Signed   By: Placido Sou M.D.   On: 11/05/2021 21:19   CT ABDOMEN PELVIS W CONTRAST  Result Date: 11/05/2021 CLINICAL DATA:  Abdominal pain. EXAM: CT ABDOMEN AND PELVIS WITH CONTRAST TECHNIQUE: Multidetector CT imaging of the abdomen and pelvis was performed using the standard protocol following bolus administration of intravenous contrast. RADIATION DOSE REDUCTION: This exam was performed according to the departmental dose-optimization program which includes automated exposure control, adjustment of the mA and/or kV according to patient size and/or use of iterative reconstruction technique. CONTRAST:  1m OMNIPAQUE IOHEXOL 350 MG/ML SOLN COMPARISON:  None Available. FINDINGS: Lower chest: Moderate emphysema. Hepatobiliary: 2 few mm hypoattenuated lesions  in the liver, too small to be actually characterize by CT. Decompressed gallbladder. Pancreas: Unremarkable. No pancreatic ductal dilatation or surrounding inflammatory changes. Spleen: Normal in size without focal abnormality. Adrenals/Urinary Tract: Normal adrenal glands. 2.2 cm renal cyst in the midpole region of the right kidney. Smaller less than 1 cm probable cysts in the lower pole of the left kidney. Normal urinary bladder. Stomach/Bowel: Normal appearance of the stomach. Fluid-filled distended small bowel loops measure up to 4 cm with a transitional point in the right mid abdomen. Diffuse left colonic diverticulosis with associated long segment circumferential mucosal wall thickening of the descending and sigmoid colon suggestive of chronic colitis. No significant pericolonic inflammatory changes. Vascular/Lymphatic: Aortic atherosclerosis. No enlarged abdominal or pelvic lymph nodes. Reproductive: Normal sized of the prostate gland. Other: No abdominal wall hernia or abnormality. No abdominopelvic ascites. Musculoskeletal: Spondylosis of the lumbosacral spine. IMPRESSION: 1. Small bowel obstruction with transitional point in the right mid abdomen. 2. Diffuse left colonic diverticulosis with associated long segment circumferential mucosal wall thickening of the descending and sigmoid colon suggestive of chronic colitis. No significant pericolonic inflammatory changes. 3. 2 few mm hypoattenuated lesions in the liver, too small to be actually characterize by CT. 4. Benign right renal cyst. 5. Moderate emphysema. Aortic Atherosclerosis (ICD10-I70.0) and Emphysema (ICD10-J43.9). Electronically Signed   By: DFidela SalisburyM.D.   On: 11/05/2021 19:24    Procedures Procedures    Medications Ordered in ED Medications  diatrizoate meglumine-sodium (GASTROGRAFIN) 66-10 % solution 90 mL (has no administration in time range)  enoxaparin (LOVENOX) injection 40 mg (has no administration in time range)   lactated ringers infusion ( Intravenous New Bag/Given 11/05/21 2202)  ondansetron (ZOFRAN) injection 4 mg (has no administration in time range)  pantoprazole (PROTONIX) injection 40 mg (40 mg Intravenous Given 11/05/21 2151)  metoprolol tartrate (LOPRESSOR) injection 2.5 mg (has no administration in time range)  sodium chloride 0.9 % bolus 1,000 mL (0 mLs Intravenous Stopped 11/05/21 1908)  ondansetron (ZOFRAN) injection 4 mg (4 mg Intravenous Given 11/05/21 1712)  ketorolac (TORADOL) 15 MG/ML injection 15 mg (15 mg Intravenous Given 11/05/21 1712)  iohexol (OMNIPAQUE) 350 MG/ML injection 75 mL (75 mLs Intravenous Contrast Given 11/05/21 1906)  LORazepam (ATIVAN) injection 1 mg (1 mg Intravenous Given 11/05/21 2130)  lidocaine (XYLOCAINE) 2 % viscous mouth solution 15 mL (15 mLs Mouth/Throat Given 11/05/21  2130)    ED Course/ Medical Decision Making/ A&P                           Medical Decision Making Amount and/or Complexity of Data Reviewed Labs: ordered. Radiology: ordered.  Risk Prescription drug management. Decision regarding hospitalization.   43:12 PM  80 yo male with PMH CAD, MI, hyperlipidemia, COPD, and tobacco use presenting with wife and son for abdominal pain. Pt admits to abdominal pain near the umbilicus, intermittent, gnawing, without radiation, with associate nausea/vomiting/diarrhea, for one month. Admits to 40 lb weight loss this month and dehydration.   Patient is alert and oriented x3, no acute distress, afebrile, stable vital signs.  Abdomen is minimally tender in the mid region.  No distention or guarding.  Patient is not actively vomiting.  Differential diagnosis included but limited to abdominal mass, bowel obstruction, diverticulitis, ACS, etc.  CT scan pertinent for small bowel obstruction.  General surgery consulted and has seen the patient at bedside.  Requesting admission to hospitalist service.  I spoke with admitting physician Dr. Nevada Crane who agrees to accept  patient.  NG tube placed by nursing. Bedside xray demonstrates abnormal positioning. NG tube curving to the right. Recommended to not use until cleared by radiology. Xray demonstrates Enteric tube tip directed superiorly within the distal esophagus. Recommend repositioning prior to use. Dilated loops of small bowel compatible with obstruction.  Nursing notified.   Otherwise no signs or symptoms of sepsis.         Final Clinical Impression(s) / ED Diagnoses Final diagnoses:  Small bowel obstruction ([place])  Nausea and vomiting, unspecified vomiting type  Loss of weight  Abdominal pain, unspecified abdominal location    Rx / DC Orders ED Discharge Orders     None         Lianne Cure, DO 54/62/70 [id number]    Campbell Stall P, DO 93/81/82 [id number]

## 2021-11-06 ENCOUNTER — Inpatient Hospital Stay (HOSPITAL_COMMUNITY): Payer: Medicare (Managed Care)

## 2021-11-06 DIAGNOSIS — K56609 Unspecified intestinal obstruction, unspecified as to partial versus complete obstruction: Secondary | ICD-10-CM | POA: Diagnosis not present

## 2021-11-06 LAB — MAGNESIUM: Magnesium: 2.3 mg/dL (ref 1.7–2.4)

## 2021-11-06 LAB — CBC
HCT: 44.1 % (ref 39.0–52.0)
HCT: 44.7 % (ref 39.0–52.0)
Hemoglobin: 14.8 g/dL (ref 13.0–17.0)
Hemoglobin: 14.7 g/dL (ref 13.0–17.0)
MCH: 32.1 pg (ref 26.0–34.0)
MCH: 31.7 pg (ref 26.0–34.0)
MCHC: 33.6 g/dL (ref 30.0–36.0)
MCHC: 32.9 g/dL (ref 30.0–36.0)
MCV: 95.7 fL (ref 80.0–100.0)
MCV: 96.5 fL (ref 80.0–100.0)
Platelets: 318 10*3/uL (ref 150–400)
Platelets: 313 10*3/uL (ref 150–400)
RBC: 4.61 MIL/uL (ref 4.22–5.81)
RBC: 4.63 MIL/uL (ref 4.22–5.81)
RDW: 12.7 % (ref 11.5–15.5)
RDW: 12.7 % (ref 11.5–15.5)
WBC: 16.6 10*3/uL — ABNORMAL HIGH (ref 4.0–10.5)
WBC: 15.5 10*3/uL — ABNORMAL HIGH (ref 4.0–10.5)
nRBC: 0 % (ref 0.0–0.2)
nRBC: 0 % (ref 0.0–0.2)

## 2021-11-06 LAB — COMPREHENSIVE METABOLIC PANEL
ALT: 22 U/L (ref 0–44)
AST: 19 U/L (ref 15–41)
Albumin: 4 g/dL (ref 3.5–5.0)
Alkaline Phosphatase: 68 U/L (ref 38–126)
Anion gap: 12 (ref 5–15)
BUN: 22 mg/dL (ref 8–23)
CO2: 27 mmol/L (ref 22–32)
Calcium: 9.7 mg/dL (ref 8.9–10.3)
Chloride: 103 mmol/L (ref 98–111)
Creatinine, Ser: 1.32 mg/dL — ABNORMAL HIGH (ref 0.61–1.24)
GFR, Estimated: 55 mL/min — ABNORMAL LOW (ref >60)
Glucose, Bld: 121 mg/dL — ABNORMAL HIGH (ref 70–99)
Potassium: 4.1 mmol/L (ref 3.5–5.1)
Sodium: 142 mmol/L (ref 135–145)
Total Bilirubin: 1.1 mg/dL (ref 0.3–1.2)
Total Protein: 6.6 g/dL (ref 6.5–8.1)

## 2021-11-06 LAB — PHOSPHORUS: Phosphorus: 3.4 mg/dL (ref 2.5–4.6)

## 2021-11-06 LAB — CREATININE, SERUM
Creatinine, Ser: 1.44 mg/dL — ABNORMAL HIGH (ref 0.61–1.24)
GFR, Estimated: 49 mL/min — ABNORMAL LOW (ref >60)

## 2021-11-06 MED ORDER — FAMOTIDINE 20 MG PO TABS: 40.0000 mg | ORAL_TABLET | Freq: Every day | ORAL | Status: DC

## 2021-11-06 MED ORDER — TIOTROPIUM BROMIDE MONOHYDRATE 2.5 MCG/ACT IN AERS: 2.0000 | INHALATION_SPRAY | Freq: Every day | RESPIRATORY_TRACT | Status: DC

## 2021-11-06 MED ORDER — ATORVASTATIN CALCIUM 40 MG PO TABS
40.0000 mg | ORAL_TABLET | Freq: Every day | ORAL | Status: DC
  Filled 2021-11-06: qty 1

## 2021-11-06 MED ORDER — METOPROLOL SUCCINATE ER 25 MG PO TB24: 25.0000 mg | ORAL_TABLET | Freq: Every day | ORAL | Status: DC

## 2021-11-06 MED ORDER — FAMOTIDINE 20 MG PO TABS: 20.0000 mg | ORAL_TABLET | Freq: Every day | ORAL | Status: DC

## 2021-11-06 MED ORDER — UMECLIDINIUM BROMIDE 62.5 MCG/ACT IN AEPB
1.0000 | INHALATION_SPRAY | Freq: Every day | RESPIRATORY_TRACT | Status: DC
  Filled 2021-11-06: qty 7

## 2021-11-06 MED ORDER — NITROGLYCERIN 2 % TD OINT
0.5000 [in_us] | TOPICAL_OINTMENT | Freq: Four times a day (QID) | TRANSDERMAL | Status: DC
  Administered 2021-11-06: 0.5 [in_us] via TOPICAL
  Filled 2021-11-06: qty 1
  Filled 2021-11-06: qty 30

## 2021-11-06 MED ORDER — ACETAMINOPHEN 325 MG PO TABS: 650.0000 mg | ORAL_TABLET | Freq: Four times a day (QID) | ORAL | Status: DC | PRN

## 2021-11-06 MED ORDER — MORPHINE SULFATE (PF) 2 MG/ML IV SOLN: 2.0000 mg | INTRAVENOUS | Status: DC | PRN

## 2021-11-06 MED ORDER — KETOROLAC TROMETHAMINE 15 MG/ML IJ SOLN: 15.0000 mg | Freq: Four times a day (QID) | INTRAMUSCULAR | Status: DC | PRN

## 2021-11-06 MED ORDER — MOMETASONE FURO-FORMOTEROL FUM 200-5 MCG/ACT IN AERO
2.0000 | INHALATION_SPRAY | Freq: Two times a day (BID) | RESPIRATORY_TRACT | Status: DC
  Filled 2021-11-06: qty 8.8

## 2021-11-06 NOTE — ED Notes (Signed)
Pt transported to XRay 

## 2021-11-06 NOTE — Progress Notes (Signed)
  Progress Note Patient: Austin [name] CHE:[id number] DOB: 29-Apr-1940 DOA: 11/05/2021  DOS: the patient was seen and examined on 11/06/2021  Brief hospital course: PMH of COPD, HTN, CAD, GERD, anxiety, depression presented to the hospital with complaints of chronic weight loss with progressive nausea vomiting and diarrhea. Found to have small bowel obstruction as well as chronic colitis on the CT scan. General surgery was consulted, NG tube was inserted.  Patient removed the NG tube as of 9/24 in the morning.  Follow-up x-ray actually shows resolution of SBO.  General surgery signed off. GI was also consulted and performing EGD/enteroscopy on 9/25.  Assessment and Plan: Small bowel obstruction Chronic colitis. No prior abdominal surgeries. NG tube was inserted, small bowel protocol was initiated. Patient started to have bowel movement.  Patient removed NG tube himself. Repeat x-ray on 9/24 shows resolution of the SBO. We will advance the diet and monitor for improvement. General surgery currently signed off.   Intractable nausea vomiting, unintentional weight loss Intermittent diarrhea for years with worsening recently. Continue daily IV Protonix GI consulted EGD and enteroscopy scheduled for tomorrow. N.p.o. after midnight.   AKI, likely prerenal from dehydration Dehydration in the setting of intractable nausea and vomiting. Treated with IV fluids. Now resolved Monitor.   Leukocytosis, suspect reactive in the setting of small bowel obstruction  CAD. Patient is on Plavix.  Currently on hold.  Subjective: No nausea or vomiting.  Had bowel movement.  No blood in the stool.  Physical Exam: Vitals:   11/06/21 1100 11/06/21 1145 11/06/21 1245 11/06/21 1353  BP: (!) 131/52   110/83  Pulse:  62 63 73  Resp: '19 16 15 18  '$ Temp:    97.9 F (36.6 C)  TempSrc:    Oral  SpO2:  100% 96% 99%  Weight:      Height:       General: Appear in mild distress; no visible Abnormal Neck Mass  Or lumps, Conjunctiva normal Cardiovascular: S1 and S2 Present, no Murmur, Respiratory: good respiratory effort, Bilateral Air entry present and CTA, no Crackles, no wheezes Abdomen: Bowel Sound present, Non tender  Extremities: no Pedal edema Neurology: alert and oriented to time, place, and person  Gait not checked due to patient safety concerns   Data Reviewed: I have Reviewed nursing notes, Vitals, and Lab results since pt's last encounter. Pertinent lab results CBC and BMP I have ordered test including CBC and BMP I have ordered imaging studies x-ray abdomen. I have discussed pt's care plan and test results with general surgery and GI.   Family Communication: No family at bedside  Disposition: Status is: Inpatient Remains inpatient appropriate because: Need to undergo procedure need to closely monitor for diet tolerance.  Will undergo push enteroscopy tomorrow.  Author: Berle Mull, MD 11/06/2021 5:00 PM  Please look on [email or web address] to find out who is on call.

## 2021-11-06 NOTE — Consult Note (Signed)
[facility] Consultation Note  Referring Provider: [facility] Primary Care Physician:  [facility] Primary Gastroenterologist:  Dr. Watt Climes  Reason for Consultation:  weight loss, vomiting  HPI: Austin [name] is a 81 y.o. male admitted vomiting, weight loss, small bowel obstruction.  3 month history of intermittent post-prandial abdominal distention, vomiting, and 30 lb weight loss.  No blood in stool.  Chronic diarrhea x years, but worse over the past few months.  CT on admission showed small bowel obstruction; patient without prior history of abdominal surgery; xray today shows resolution of obstruction with contrast having reached colon.  Chronic GERD; on Prilosec; no NSAIDs.  On Plavix for prior history CAD with stent, last dose 2 days ago.   Past Medical History:  Diagnosis Date   CAD (coronary artery disease)    COPD (chronic obstructive pulmonary disease) (HCC)    Dyspnea on exertion    GERD (gastroesophageal reflux disease)    Hyperlipidemia    MI (myocardial infarction) ([place])    Rectal bleeding    Tobacco abuse    Unspecified essential hypertension     Past Surgical History:  Procedure Laterality Date   CORONARY ANGIOPLASTY WITH STENT PLACEMENT     Drug eluting stent placed to the first obtuse marginal with a 2.58 mm cypher drug eluting stent. Circumflex was treated with 3.0 x 18 mm TAXUS drug-eluting stent on 07/17/03   EYE SURGERY  1971   bilateral   PTCA     And drug-eluting stent to the RCA   RECTAL SURGERY  2008   dr. Rosana Hoes did this surgery    Prior to Admission medications   Medication Sig Start Date End Date Taking? Authorizing Provider  atorvastatin (LIPITOR) 40 MG tablet Take 1 tablet (40 mg total) by mouth at bedtime. 12/10/20   Dunn, Nedra Hai, PA-C  clopidogrel (PLAVIX) 75 MG tablet Take 1 tablet (75 mg total) by mouth daily. 12/10/20   Dunn, Nedra Hai, PA-C  famotidine (PEPCID) 40 MG tablet Take 1 tablet (40 mg total) by mouth  daily. 10/04/21   Piontek, Junie Panning, MD  guaiFENesin (MUCINEX) 600 MG 12 hr tablet Take 600 mg by mouth 2 (two) times daily as needed.    [provider]  losartan (COZAAR) 25 MG tablet Take 1 tablet (25 mg total) by mouth daily. 12/10/20   Dunn, Nedra Hai, PA-C  metoprolol succinate (TOPROL-XL) 50 MG 24 hr tablet TAKE 1 TABLET by mouth ( 50 mg) daily. 12/10/20   Dunn, Nedra Hai, PA-C  Multiple Vitamins-Minerals (MULTIVITAMIN WITH MINERALS) tablet Take 1 tablet by mouth daily.    [provider]  nitroGLYCERIN (NITROSTAT) 0.4 MG SL tablet Place 1 tablet (0.4 mg total) under the tongue as directed. 11/10/19   Burnell Blanks, MD  PROAIR HFA 108 [id number] Base) MCG/ACT inhaler Inhale 1 puff into the lungs every 4 (four) hours as needed for shortness of breath. 01/22/16   [provider]  SPIRIVA RESPIMAT 2.5 MCG/ACT AERS Inhale 2 puffs into the lungs daily. 12/23/18   [provider]  SYMBICORT 160-4.5 MCG/ACT inhaler Inhale 2 puffs into the lungs daily. 12/23/18   [provider]  tiotropium (SPIRIVA) 18 MCG inhalation capsule Place 18 mcg into inhaler and inhale daily.    [provider]  triamcinolone cream (KENALOG) 0.1 % 2 (two) times daily. 11/04/20   [provider]    Current Facility-Administered Medications  Medication Dose Route Frequency Provider Last Rate Last Admin   acetaminophen (  TYLENOL) tablet 650 mg  650 mg Oral Q6H PRN Lavina Hamman, MD       atorvastatin (LIPITOR) tablet 40 mg  40 mg Oral QHS Lavina Hamman, MD       enoxaparin (LOVENOX) injection 40 mg  40 mg Subcutaneous Q24H Irene Pap N, DO   40 mg at 11/06/21 1304   [START ON 11/07/2021] famotidine (PEPCID) tablet 20 mg  20 mg Oral Daily Lavina Hamman, MD       lactated ringers infusion   Intravenous Continuous Kayleen Memos, DO 100 mL/hr at 11/06/21 1459 New Bag at 11/06/21 1459   metoprolol succinate (TOPROL-XL) 24 hr tablet 25 mg  25 mg Oral Daily Lavina Hamman, MD       metoprolol tartrate (LOPRESSOR) injection 2.5 mg  2.5 mg Intravenous Q6H PRN Hall, Carole N, DO       mometasone-formoterol (DULERA) 200-5 MCG/ACT inhaler 2 puff  2 puff Inhalation BID Lavina Hamman, MD       morphine (PF) 2 MG/ML injection 2 mg  2 mg Intravenous Q2H PRN Lavina Hamman, MD       nitroGLYCERIN (NITROGLYN) 2 % ointment 0.5 inch  0.5 inch Topical Q6H Lavina Hamman, MD   0.5 inch at 11/06/21 1301   ondansetron (ZOFRAN) injection 4 mg  4 mg Intravenous Q6H PRN Irene Pap N, DO       pantoprazole (PROTONIX) injection 40 mg  40 mg Intravenous Daily Irene Pap N, DO   40 mg at 11/06/21 1302   [START ON 11/07/2021] umeclidinium bromide (INCRUSE ELLIPTA) 62.5 MCG/ACT 1 puff  1 puff Inhalation Daily Lavina Hamman, MD        Allergies as of 11/05/2021   (No Known Allergies)    Family History  Problem Relation Age of Onset   Cancer Mother        cervical   Lung disease Father        Black lung    Social History   Socioeconomic History   Marital status: Married    Spouse name: Not on file   Number of children: Not on file   Years of education: Not on file   Highest education level: Not on file  Occupational History   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  Vaping Use   Vaping Use: Never used  Substance and Sexual Activity   Alcohol use: Yes   Drug use: No   Sexual activity: Not on file  Other Topics Concern   Not on file  Social History Narrative   Lives in [place] with his wife   [place] veteran   Exercises 3 times a week at the [employer] Determinants of Health   Financial Resource Strain: Not on file  Food Insecurity: Not on file  Transportation Needs:  Not on file  Physical Activity: Not on file  Stress: Not on file  Social Connections: Not on file  Intimate Partner Violence: Not on file    Review of Systems: As per HPI, all others negative  Physical Exam: Vital signs in last 24 hours: Temp:  [97.9 F (36.6  C)-98.8 F (37.1 C)] 97.9 F (36.6 C) (09/24 1353) Pulse Rate:  [62-85] 73 (09/24 1353) Resp:  [15-24] 18 (09/24 1353) BP: (110-201)/(52-102) 110/83 (09/24 1353) SpO2:  [90 %-100 %] 99 % (09/24 1353) Weight:  [57.2 kg] 57.2 kg (09/23 1553) Last BM Date : 11/06/21 General:   Alert,  Well-developed, well-nourished, pleasant and cooperative in NAD, bit confused Head:  Normocephalic and atraumatic. Eyes:  Sclera clear, no icterus.   Conjunctiva pink. Ears:  Normal auditory acuity. Nose:  No deformity, discharge,  or lesions. Mouth:  No deformity or lesions.  Oropharynx pink & dry Neck:  Supple; no masses or thyromegaly. Lungs:  Clear throughout to auscultation.   No wheezes, crackles, or rhonchi. No acute distress. Heart:  Regular rate and rhythm; no murmurs, clicks, rubs,  or gallops. Abdomen:  Soft, mild distended, still tympanic to percussion, non-tender,No masses, hepatosplenomegaly or hernias noted. Normal bowel sounds, without guarding, and without rebound.     Msk:  Diffuse symmetrical muscular atrophy, othrewise symmetrical without gross deformities. Normal posture. Pulses:  Normal pulses noted. Extremities:  Without clubbing or edema. Neurologic:  Alert and  oriented x4;  grossly normal neurologically. Skin:  Scattered ecchymoses/telangiectasias, otherwise intact without significant lesions or rashes. Cervical Nodes:  No significant cervical adenopathy. Psych:  Alert and cooperative. Bit confused.   Lab Results: Recent Labs    11/05/21 1600 11/06/21 0132 11/06/21 0339  WBC 9.7 16.6* 15.5*  HGB 15.1 14.8 14.7  HCT 44.0 44.1 44.7  PLT 322 318 313   BMET Recent Labs    11/05/21 1600 11/06/21 0132  11/06/21 0339  NA 140  --  142  K 4.1  --  4.1  CL 100  --  103  CO2 30  --  27  GLUCOSE 133*  --  121*  BUN 18  --  22  CREATININE 1.17 1.44* 1.32*  CALCIUM 9.9  --  9.7   LFT Recent Labs    11/06/21 0339  PROT 6.6  ALBUMIN 4.0  AST 19  ALT 22  ALKPHOS 68  BILITOT 1.1   PT/INR No results for input(s): "LABPROT", "INR" in the last 72 hours.  Studies/Results: DG Abd 2 Views  Result Date: 11/06/2021 CLINICAL DATA:  81 year old male with abdominal pain and small-bowel obstruction. EXAM: ABDOMEN - 2 VIEW COMPARISON:  11/05/2021 and earlier. FINDINGS: Upright and supine views of the abdomen and pelvis at 0909 hours. Enteric tube has been removed. Oral contrast administered since the CT Abdomen and Pelvis on 11/05/2021 is now present in the right colon. Excreted IV contrast also in the urinary bladder. Nonobstructed bowel-gas pattern now. No pneumoperitoneum. Negative lung bases. Normal cardiac size. No acute osseous abnormality identified. IMPRESSION: 1. Resolved small bowel obstruction since yesterday with enteric contrast now in the right colon. No pneumoperitoneum. 2. Removed enteric tube. Electronically Signed   By: Genevie Ann M.D.   On: 11/06/2021 09:24   DG Abdomen 1 View  Result Date: 11/05/2021 CLINICAL DATA:  NG-tube EXAM: ABDOMEN - 1 VIEW COMPARISON:  Abdominal x-ray 11/05/2021 FINDINGS: Nasogastric tube tip is now at the level of the distal stomach. Dilated small bowel loops are again seen. Lung bases are clear. IMPRESSION: Nasogastric tube tip now at the level of the distal stomach. Electronically Signed   By: Ronney Asters M.D.   On: 11/05/2021 21:59   DG Abd Portable 1V-Small Bowel Protocol-Position Verification  Result Date: 11/05/2021 CLINICAL DATA:  NG tube placement EXAM: PORTABLE ABDOMEN - 1 VIEW COMPARISON:  CT abdomen and pelvis earlier today FINDINGS: Interval placement of enteric tube. The enteric tube is looped in the stomach with tip directed superiorly likely  within the distal esophagus. Repositioning is recommended prior to use. Partially visualized dilated loops of small bowel compatible with obstruction as seen on CT earlier today. IMPRESSION: Enteric tube tip directed superiorly within the distal esophagus. Recommend repositioning prior to use. Dilated loops of small bowel compatible with obstruction. Electronically Signed   By: Placido Sou M.D.   On: 11/05/2021 21:19   CT ABDOMEN PELVIS W CONTRAST  Result Date: 11/05/2021 CLINICAL DATA:  Abdominal pain. EXAM: CT ABDOMEN AND PELVIS WITH CONTRAST TECHNIQUE: Multidetector CT imaging of the abdomen and pelvis was performed using the standard protocol following bolus administration of intravenous contrast. RADIATION DOSE REDUCTION: This exam was performed according to the departmental dose-optimization program which includes automated exposure control, adjustment of the mA and/or kV according to patient size and/or use of iterative reconstruction technique. CONTRAST:  51m OMNIPAQUE IOHEXOL 350 MG/ML SOLN COMPARISON:  None Available. FINDINGS: Lower chest: Moderate emphysema. Hepatobiliary: 2 few mm hypoattenuated lesions in the liver, too small to be actually characterize by CT. Decompressed gallbladder. Pancreas: Unremarkable. No pancreatic ductal dilatation or surrounding inflammatory changes. Spleen: Normal in size without focal abnormality. Adrenals/Urinary Tract: Normal adrenal glands. 2.2 cm renal cyst in the midpole region of the right kidney. Smaller less than 1 cm probable cysts in the lower pole of the left kidney. Normal urinary bladder. Stomach/Bowel: Normal appearance of the stomach. Fluid-filled distended small bowel loops measure up to 4 cm with a transitional point in the right mid abdomen. Diffuse left colonic diverticulosis with associated long segment circumferential mucosal wall thickening of the descending and sigmoid colon suggestive of chronic colitis. No significant pericolonic  inflammatory changes. Vascular/Lymphatic: Aortic atherosclerosis. No enlarged abdominal or pelvic lymph nodes. Reproductive: Normal sized of the prostate gland. Other: No abdominal wall hernia or abnormality. No abdominopelvic ascites. Musculoskeletal: Spondylosis of the lumbosacral spine. IMPRESSION: 1. Small bowel obstruction with transitional point in the right mid abdomen. 2. Diffuse left colonic diverticulosis with associated long segment circumferential mucosal wall thickening of the descending and sigmoid colon suggestive of chronic colitis. No significant pericolonic inflammatory changes. 3. 2 few mm hypoattenuated lesions in the liver, too small to be actually characterize by CT. 4. Benign right renal cyst. 5. Moderate emphysema. Aortic Atherosclerosis (ICD10-I70.0) and Emphysema (ICD10-J43.9). Electronically Signed   By: DFidela SalisburyM.D.   On: 11/05/2021 19:24    Impression:   Intermittent diarrhea x years. Worsening diarrhea x 3 months. New onset and worsening intermittent post-prandial vomiting and abdominal distention x 3 months. Weight loss, 30 lbs over the past few months. Abnormal imaging, small bowel obstruction with mid small bowel (?) transition point. Abnormal imaging, descending and sigmoid diverticulosis with thickening. I am most concerned about intermittent partial obstruction somewhere in small bowel.  With weight loss, malignancy of small bowel (lymphoma or adenocarcinoma) would be leading contenders.  Patient seems low likelihood of inflammatory bowel disease or adhesions (no prior abdominal surgeries). Altered mental status, mild. Chronic anticoagulation (clopidigrel), prior history of CAD, last dose Friday 9/22.  Plan:  Endoscopy with enteroscopy tomorrow with Dr. SMichail Sermon Clear liquids today ok, NPO after midnight. Risks (bleeding, infection, bowel perforation that could require surgery, sedation-related changes in cardiopulmonary systems), benefits  (identification and possible treatment of source of symptoms, exclusion of certain causes of symptoms), and alternatives (watchful waiting, radiographic imaging studies, empiric medical treatment) of upper  endoscopy (EGD) were explained to patient/family in detail and patient wishes to proceed.  If EGD/Enteroscopy unrevealing, would consider capsule endoscopy (with risk of capsule impaction) vs CT enterography versus other for better evaluation of more distal small bowel. [facility] will follow.   LOS: 1 day   Tyge Somers M  11/06/2021, 3:03 PM  Cell [phone] If no answer or after 5 PM call [phone]

## 2021-11-06 NOTE — ED Notes (Signed)
Patient is Alert with confusion. Patient pulled out NG, and cords to monitor. Patient ids now laying in bed, turning TV off and on. MD made aware of confusion and NG out. Will continue to monitor

## 2021-11-06 NOTE — ED Notes (Signed)
Report given to floor RN.  We are to await purple man before transporting pt.

## 2021-11-06 NOTE — Hospital Course (Signed)
PMH of COPD, HTN, CAD, GERD, anxiety, depression presented to the hospital with complaints of chronic weight loss with progressive nausea vomiting and diarrhea. Found to have small bowel obstruction as well as chronic colitis on the CT scan. General surgery was consulted, NG tube was inserted.  Patient removed the NG tube as of 9/24 in the morning.  Follow-up x-ray actually shows resolution of SBO.  General surgery signed off. GI was also consulted and performing EGD/enteroscopy on 9/25.

## 2021-11-06 NOTE — H&P (View-Only) (Signed)
[facility] Consultation Note  Referring Provider: [facility] Primary Care Physician:  [facility] Primary Gastroenterologist:  Dr. Watt Climes  Reason for Consultation:  weight loss, vomiting  HPI: Austin [name] is a 81 y.o. male admitted vomiting, weight loss, small bowel obstruction.  3 month history of intermittent post-prandial abdominal distention, vomiting, and 30 lb weight loss.  No blood in stool.  Chronic diarrhea x years, but worse over the past few months.  CT on admission showed small bowel obstruction; patient without prior history of abdominal surgery; xray today shows resolution of obstruction with contrast having reached colon.  Chronic GERD; on Prilosec; no NSAIDs.  On Plavix for prior history CAD with stent, last dose 2 days ago.   Past Medical History:  Diagnosis Date   CAD (coronary artery disease)    COPD (chronic obstructive pulmonary disease) (HCC)    Dyspnea on exertion    GERD (gastroesophageal reflux disease)    Hyperlipidemia    MI (myocardial infarction) ([place])    Rectal bleeding    Tobacco abuse    Unspecified essential hypertension     Past Surgical History:  Procedure Laterality Date   CORONARY ANGIOPLASTY WITH STENT PLACEMENT     Drug eluting stent placed to the first obtuse marginal with a 2.58 mm cypher drug eluting stent. Circumflex was treated with 3.0 x 18 mm TAXUS drug-eluting stent on 07/17/03   EYE SURGERY  1971   bilateral   PTCA     And drug-eluting stent to the RCA   RECTAL SURGERY  2008   dr. Rosana Hoes did this surgery    Prior to Admission medications   Medication Sig Start Date End Date Taking? Authorizing Provider  atorvastatin (LIPITOR) 40 MG tablet Take 1 tablet (40 mg total) by mouth at bedtime. 12/10/20   Dunn, Nedra Hai, PA-C  clopidogrel (PLAVIX) 75 MG tablet Take 1 tablet (75 mg total) by mouth daily. 12/10/20   Dunn, Nedra Hai, PA-C  famotidine (PEPCID) 40 MG tablet Take 1 tablet (40 mg total) by mouth  daily. 10/04/21   Piontek, Junie Panning, MD  guaiFENesin (MUCINEX) 600 MG 12 hr tablet Take 600 mg by mouth 2 (two) times daily as needed.    [provider]  losartan (COZAAR) 25 MG tablet Take 1 tablet (25 mg total) by mouth daily. 12/10/20   Dunn, Nedra Hai, PA-C  metoprolol succinate (TOPROL-XL) 50 MG 24 hr tablet TAKE 1 TABLET by mouth ( 50 mg) daily. 12/10/20   Dunn, Nedra Hai, PA-C  Multiple Vitamins-Minerals (MULTIVITAMIN WITH MINERALS) tablet Take 1 tablet by mouth daily.    [provider]  nitroGLYCERIN (NITROSTAT) 0.4 MG SL tablet Place 1 tablet (0.4 mg total) under the tongue as directed. 11/10/19   Burnell Blanks, MD  PROAIR HFA 108 [phone] Base) MCG/ACT inhaler Inhale 1 puff into the lungs every 4 (four) hours as needed for shortness of breath. 01/22/16   [provider]  SPIRIVA RESPIMAT 2.5 MCG/ACT AERS Inhale 2 puffs into the lungs daily. 12/23/18   [provider]  SYMBICORT 160-4.5 MCG/ACT inhaler Inhale 2 puffs into the lungs daily. 12/23/18   [provider]  tiotropium (SPIRIVA) 18 MCG inhalation capsule Place 18 mcg into inhaler and inhale daily.    [provider]  triamcinolone cream (KENALOG) 0.1 % 2 (two) times daily. 11/04/20   [provider]    Current Facility-Administered Medications  Medication Dose Route Frequency Provider Last Rate Last Admin   acetaminophen (  TYLENOL) tablet 650 mg  650 mg Oral Q6H PRN Lavina Hamman, MD       atorvastatin (LIPITOR) tablet 40 mg  40 mg Oral QHS Lavina Hamman, MD       enoxaparin (LOVENOX) injection 40 mg  40 mg Subcutaneous Q24H Irene Pap N, DO   40 mg at 11/06/21 1304   [START ON 11/07/2021] famotidine (PEPCID) tablet 20 mg  20 mg Oral Daily Lavina Hamman, MD       lactated ringers infusion   Intravenous Continuous Kayleen Memos, DO 100 mL/hr at 11/06/21 1459 New Bag at 11/06/21 1459   metoprolol succinate (TOPROL-XL) 24 hr tablet 25 mg  25 mg Oral Daily Lavina Hamman, MD       metoprolol tartrate (LOPRESSOR) injection 2.5 mg  2.5 mg Intravenous Q6H PRN Hall, Carole N, DO       mometasone-formoterol (DULERA) 200-5 MCG/ACT inhaler 2 puff  2 puff Inhalation BID Lavina Hamman, MD       morphine (PF) 2 MG/ML injection 2 mg  2 mg Intravenous Q2H PRN Lavina Hamman, MD       nitroGLYCERIN (NITROGLYN) 2 % ointment 0.5 inch  0.5 inch Topical Q6H Lavina Hamman, MD   0.5 inch at 11/06/21 1301   ondansetron (ZOFRAN) injection 4 mg  4 mg Intravenous Q6H PRN Irene Pap N, DO       pantoprazole (PROTONIX) injection 40 mg  40 mg Intravenous Daily Irene Pap N, DO   40 mg at 11/06/21 1302   [START ON 11/07/2021] umeclidinium bromide (INCRUSE ELLIPTA) 62.5 MCG/ACT 1 puff  1 puff Inhalation Daily Lavina Hamman, MD        Allergies as of 11/05/2021   (No Known Allergies)    Family History  Problem Relation Age of Onset   Cancer Mother        cervical   Lung disease Father        Black lung    Social History   Socioeconomic History   Marital status: Married    Spouse name: Not on file   Number of children: Not on file   Years of education: Not on file   Highest education level: Not on file  Occupational History   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  Vaping Use   Vaping Use: Never used  Substance and Sexual Activity   Alcohol use: Yes   Drug use: No   Sexual activity: Not on file  Other Topics Concern   Not on file  Social History Narrative   Lives in [place] with his wife   [place] veteran   Exercises 3 times a week at the [employer] Determinants of Health   Financial Resource Strain: Not on file  Food Insecurity: Not on file  Transportation Needs:  Not on file  Physical Activity: Not on file  Stress: Not on file  Social Connections: Not on file  Intimate Partner Violence: Not on file    Review of Systems: As per HPI, all others negative  Physical Exam: Vital signs in last 24 hours: Temp:  [97.9 F (36.6  C)-98.8 F (37.1 C)] 97.9 F (36.6 C) (09/24 1353) Pulse Rate:  [62-85] 73 (09/24 1353) Resp:  [15-24] 18 (09/24 1353) BP: (110-201)/(52-102) 110/83 (09/24 1353) SpO2:  [90 %-100 %] 99 % (09/24 1353) Weight:  [57.2 kg] 57.2 kg (09/23 1553) Last BM Date : 11/06/21 General:   Alert,  Well-developed, well-nourished, pleasant and cooperative in NAD, bit confused Head:  Normocephalic and atraumatic. Eyes:  Sclera clear, no icterus.   Conjunctiva pink. Ears:  Normal auditory acuity. Nose:  No deformity, discharge,  or lesions. Mouth:  No deformity or lesions.  Oropharynx pink & dry Neck:  Supple; no masses or thyromegaly. Lungs:  Clear throughout to auscultation.   No wheezes, crackles, or rhonchi. No acute distress. Heart:  Regular rate and rhythm; no murmurs, clicks, rubs,  or gallops. Abdomen:  Soft, mild distended, still tympanic to percussion, non-tender,No masses, hepatosplenomegaly or hernias noted. Normal bowel sounds, without guarding, and without rebound.     Msk:  Diffuse symmetrical muscular atrophy, othrewise symmetrical without gross deformities. Normal posture. Pulses:  Normal pulses noted. Extremities:  Without clubbing or edema. Neurologic:  Alert and  oriented x4;  grossly normal neurologically. Skin:  Scattered ecchymoses/telangiectasias, otherwise intact without significant lesions or rashes. Cervical Nodes:  No significant cervical adenopathy. Psych:  Alert and cooperative. Bit confused.   Lab Results: Recent Labs    11/05/21 1600 11/06/21 0132 11/06/21 0339  WBC 9.7 16.6* 15.5*  HGB 15.1 14.8 14.7  HCT 44.0 44.1 44.7  PLT 322 318 313   BMET Recent Labs    11/05/21 1600 11/06/21 0132  11/06/21 0339  NA 140  --  142  K 4.1  --  4.1  CL 100  --  103  CO2 30  --  27  GLUCOSE 133*  --  121*  BUN 18  --  22  CREATININE 1.17 1.44* 1.32*  CALCIUM 9.9  --  9.7   LFT Recent Labs    11/06/21 0339  PROT 6.6  ALBUMIN 4.0  AST 19  ALT 22  ALKPHOS 68  BILITOT 1.1   PT/INR No results for input(s): "LABPROT", "INR" in the last 72 hours.  Studies/Results: DG Abd 2 Views  Result Date: 11/06/2021 CLINICAL DATA:  81 year old male with abdominal pain and small-bowel obstruction. EXAM: ABDOMEN - 2 VIEW COMPARISON:  11/05/2021 and earlier. FINDINGS: Upright and supine views of the abdomen and pelvis at 0909 hours. Enteric tube has been removed. Oral contrast administered since the CT Abdomen and Pelvis on 11/05/2021 is now present in the right colon. Excreted IV contrast also in the urinary bladder. Nonobstructed bowel-gas pattern now. No pneumoperitoneum. Negative lung bases. Normal cardiac size. No acute osseous abnormality identified. IMPRESSION: 1. Resolved small bowel obstruction since yesterday with enteric contrast now in the right colon. No pneumoperitoneum. 2. Removed enteric tube. Electronically Signed   By: Genevie Ann M.D.   On: 11/06/2021 09:24   DG Abdomen 1 View  Result Date: 11/05/2021 CLINICAL DATA:  NG-tube EXAM: ABDOMEN - 1 VIEW COMPARISON:  Abdominal x-ray 11/05/2021 FINDINGS: Nasogastric tube tip is now at the level of the distal stomach. Dilated small bowel loops are again seen. Lung bases are clear. IMPRESSION: Nasogastric tube tip now at the level of the distal stomach. Electronically Signed   By: Ronney Asters M.D.   On: 11/05/2021 21:59   DG Abd Portable 1V-Small Bowel Protocol-Position Verification  Result Date: 11/05/2021 CLINICAL DATA:  NG tube placement EXAM: PORTABLE ABDOMEN - 1 VIEW COMPARISON:  CT abdomen and pelvis earlier today FINDINGS: Interval placement of enteric tube. The enteric tube is looped in the stomach with tip directed superiorly likely  within the distal esophagus. Repositioning is recommended prior to use. Partially visualized dilated loops of small bowel compatible with obstruction as seen on CT earlier today. IMPRESSION: Enteric tube tip directed superiorly within the distal esophagus. Recommend repositioning prior to use. Dilated loops of small bowel compatible with obstruction. Electronically Signed   By: Placido Sou M.D.   On: 11/05/2021 21:19   CT ABDOMEN PELVIS W CONTRAST  Result Date: 11/05/2021 CLINICAL DATA:  Abdominal pain. EXAM: CT ABDOMEN AND PELVIS WITH CONTRAST TECHNIQUE: Multidetector CT imaging of the abdomen and pelvis was performed using the standard protocol following bolus administration of intravenous contrast. RADIATION DOSE REDUCTION: This exam was performed according to the departmental dose-optimization program which includes automated exposure control, adjustment of the mA and/or kV according to patient size and/or use of iterative reconstruction technique. CONTRAST:  67m OMNIPAQUE IOHEXOL 350 MG/ML SOLN COMPARISON:  None Available. FINDINGS: Lower chest: Moderate emphysema. Hepatobiliary: 2 few mm hypoattenuated lesions in the liver, too small to be actually characterize by CT. Decompressed gallbladder. Pancreas: Unremarkable. No pancreatic ductal dilatation or surrounding inflammatory changes. Spleen: Normal in size without focal abnormality. Adrenals/Urinary Tract: Normal adrenal glands. 2.2 cm renal cyst in the midpole region of the right kidney. Smaller less than 1 cm probable cysts in the lower pole of the left kidney. Normal urinary bladder. Stomach/Bowel: Normal appearance of the stomach. Fluid-filled distended small bowel loops measure up to 4 cm with a transitional point in the right mid abdomen. Diffuse left colonic diverticulosis with associated long segment circumferential mucosal wall thickening of the descending and sigmoid colon suggestive of chronic colitis. No significant pericolonic  inflammatory changes. Vascular/Lymphatic: Aortic atherosclerosis. No enlarged abdominal or pelvic lymph nodes. Reproductive: Normal sized of the prostate gland. Other: No abdominal wall hernia or abnormality. No abdominopelvic ascites. Musculoskeletal: Spondylosis of the lumbosacral spine. IMPRESSION: 1. Small bowel obstruction with transitional point in the right mid abdomen. 2. Diffuse left colonic diverticulosis with associated long segment circumferential mucosal wall thickening of the descending and sigmoid colon suggestive of chronic colitis. No significant pericolonic inflammatory changes. 3. 2 few mm hypoattenuated lesions in the liver, too small to be actually characterize by CT. 4. Benign right renal cyst. 5. Moderate emphysema. Aortic Atherosclerosis (ICD10-I70.0) and Emphysema (ICD10-J43.9). Electronically Signed   By: DFidela SalisburyM.D.   On: 11/05/2021 19:24    Impression:   Intermittent diarrhea x years. Worsening diarrhea x 3 months. New onset and worsening intermittent post-prandial vomiting and abdominal distention x 3 months. Weight loss, 30 lbs over the past few months. Abnormal imaging, small bowel obstruction with mid small bowel (?) transition point. Abnormal imaging, descending and sigmoid diverticulosis with thickening. I am most concerned about intermittent partial obstruction somewhere in small bowel.  With weight loss, malignancy of small bowel (lymphoma or adenocarcinoma) would be leading contenders.  Patient seems low likelihood of inflammatory bowel disease or adhesions (no prior abdominal surgeries). Altered mental status, mild. Chronic anticoagulation (clopidigrel), prior history of CAD, last dose Friday 9/22.  Plan:  Endoscopy with enteroscopy tomorrow with Dr. SMichail Sermon Clear liquids today ok, NPO after midnight. Risks (bleeding, infection, bowel perforation that could require surgery, sedation-related changes in cardiopulmonary systems), benefits  (identification and possible treatment of source of symptoms, exclusion of certain causes of symptoms), and alternatives (watchful waiting, radiographic imaging studies, empiric medical treatment) of upper  endoscopy (EGD) were explained to patient/family in detail and patient wishes to proceed.  If EGD/Enteroscopy unrevealing, would consider capsule endoscopy (with risk of capsule impaction) vs CT enterography versus other for better evaluation of more distal small bowel. [facility] will follow.   LOS: 1 day   Jessina Marse M  11/06/2021, 3:03 PM  Cell [phone] If no answer or after 5 PM call [phone]

## 2021-11-06 NOTE — Progress Notes (Signed)
Subjective: CC: Pulled ngt out overnight. Reports abdominal pain, n/v resolved. Passing flatus. Bm this am.   Objective: Vital signs in last 24 hours: Temp:  [98.1 F (36.7 C)-98.8 F (37.1 C)] 98.1 F (36.7 C) (09/24 1013) Pulse Rate:  [62-85] 69 (09/24 1030) Resp:  [15-18] 16 (09/24 1030) BP: (129-201)/(67-102) 129/67 (09/24 1030) SpO2:  [90 %-98 %] 97 % (09/24 1030) Weight:  [57.2 kg] 57.2 kg (09/23 1553)    Intake/Output from previous day: 09/23 0701 - 09/24 0700 In: 999 [IV Piggyback:999] Out: -  Intake/Output this shift: No intake/output data recorded.  PE: Gen:  Alert, NAD, pleasant Abd: Soft, ND, NT, +BS  Lab Results:  Recent Labs    11/06/21 0132 11/06/21 0339  WBC 16.6* 15.5*  HGB 14.8 14.7  HCT 44.1 44.7  PLT 318 313   BMET Recent Labs    11/05/21 1600 11/06/21 0132 11/06/21 0339  NA 140  --  142  K 4.1  --  4.1  CL 100  --  103  CO2 30  --  27  GLUCOSE 133*  --  121*  BUN 18  --  22  CREATININE 1.17 1.44* 1.32*  CALCIUM 9.9  --  9.7   PT/INR No results for input(s): "LABPROT", "INR" in the last 72 hours. CMP     Component Value Date/Time   NA 142 11/06/2021 0339   K 4.1 11/06/2021 0339   CL 103 11/06/2021 0339   CO2 27 11/06/2021 0339   GLUCOSE 121 (H) 11/06/2021 0339   BUN 22 11/06/2021 0339   CREATININE 1.32 (H) 11/06/2021 0339   CALCIUM 9.7 11/06/2021 0339   PROT 6.6 11/06/2021 0339   ALBUMIN 4.0 11/06/2021 0339   AST 19 11/06/2021 0339   ALT 22 11/06/2021 0339   ALKPHOS 68 11/06/2021 0339   BILITOT 1.1 11/06/2021 0339   GFRNONAA 55 (L) 11/06/2021 0339   GFRAA  11/06/2008 1030    >60        The eGFR has been calculated using the MDRD equation. This calculation has not been validated in all clinical situations. eGFR's persistently <60 mL/min signify possible Chronic Kidney Disease.   Lipase     Component Value Date/Time   LIPASE 28 11/05/2021 1600    Studies/Results: DG Abd 2 Views  Result Date:  11/06/2021 CLINICAL DATA:  81 year old male with abdominal pain and small-bowel obstruction. EXAM: ABDOMEN - 2 VIEW COMPARISON:  11/05/2021 and earlier. FINDINGS: Upright and supine views of the abdomen and pelvis at 0909 hours. Enteric tube has been removed. Oral contrast administered since the CT Abdomen and Pelvis on 11/05/2021 is now present in the right colon. Excreted IV contrast also in the urinary bladder. Nonobstructed bowel-gas pattern now. No pneumoperitoneum. Negative lung bases. Normal cardiac size. No acute osseous abnormality identified. IMPRESSION: 1. Resolved small bowel obstruction since yesterday with enteric contrast now in the right colon. No pneumoperitoneum. 2. Removed enteric tube. Electronically Signed   By: Genevie Ann M.D.   On: 11/06/2021 09:24   DG Abdomen 1 View  Result Date: 11/05/2021 CLINICAL DATA:  NG-tube EXAM: ABDOMEN - 1 VIEW COMPARISON:  Abdominal x-ray 11/05/2021 FINDINGS: Nasogastric tube tip is now at the level of the distal stomach. Dilated small bowel loops are again seen. Lung bases are clear. IMPRESSION: Nasogastric tube tip now at the level of the distal stomach. Electronically Signed   By: Ronney Asters M.D.   On: 11/05/2021 21:59   DG Abd  Portable 1V-Small Bowel Protocol-Position Verification  Result Date: 11/05/2021 CLINICAL DATA:  NG tube placement EXAM: PORTABLE ABDOMEN - 1 VIEW COMPARISON:  CT abdomen and pelvis earlier today FINDINGS: Interval placement of enteric tube. The enteric tube is looped in the stomach with tip directed superiorly likely within the distal esophagus. Repositioning is recommended prior to use. Partially visualized dilated loops of small bowel compatible with obstruction as seen on CT earlier today. IMPRESSION: Enteric tube tip directed superiorly within the distal esophagus. Recommend repositioning prior to use. Dilated loops of small bowel compatible with obstruction. Electronically Signed   By: Placido Sou M.D.   On: 11/05/2021  21:19   CT ABDOMEN PELVIS W CONTRAST  Result Date: 11/05/2021 CLINICAL DATA:  Abdominal pain. EXAM: CT ABDOMEN AND PELVIS WITH CONTRAST TECHNIQUE: Multidetector CT imaging of the abdomen and pelvis was performed using the standard protocol following bolus administration of intravenous contrast. RADIATION DOSE REDUCTION: This exam was performed according to the departmental dose-optimization program which includes automated exposure control, adjustment of the mA and/or kV according to patient size and/or use of iterative reconstruction technique. CONTRAST:  27m OMNIPAQUE IOHEXOL 350 MG/ML SOLN COMPARISON:  None Available. FINDINGS: Lower chest: Moderate emphysema. Hepatobiliary: 2 few mm hypoattenuated lesions in the liver, too small to be actually characterize by CT. Decompressed gallbladder. Pancreas: Unremarkable. No pancreatic ductal dilatation or surrounding inflammatory changes. Spleen: Normal in size without focal abnormality. Adrenals/Urinary Tract: Normal adrenal glands. 2.2 cm renal cyst in the midpole region of the right kidney. Smaller less than 1 cm probable cysts in the lower pole of the left kidney. Normal urinary bladder. Stomach/Bowel: Normal appearance of the stomach. Fluid-filled distended small bowel loops measure up to 4 cm with a transitional point in the right mid abdomen. Diffuse left colonic diverticulosis with associated long segment circumferential mucosal wall thickening of the descending and sigmoid colon suggestive of chronic colitis. No significant pericolonic inflammatory changes. Vascular/Lymphatic: Aortic atherosclerosis. No enlarged abdominal or pelvic lymph nodes. Reproductive: Normal sized of the prostate gland. Other: No abdominal wall hernia or abnormality. No abdominopelvic ascites. Musculoskeletal: Spondylosis of the lumbosacral spine. IMPRESSION: 1. Small bowel obstruction with transitional point in the right mid abdomen. 2. Diffuse left colonic diverticulosis with  associated long segment circumferential mucosal wall thickening of the descending and sigmoid colon suggestive of chronic colitis. No significant pericolonic inflammatory changes. 3. 2 few mm hypoattenuated lesions in the liver, too small to be actually characterize by CT. 4. Benign right renal cyst. 5. Moderate emphysema. Aortic Atherosclerosis (ICD10-I70.0) and Emphysema (ICD10-J43.9). Electronically Signed   By: DFidela SalisburyM.D.   On: 11/05/2021 19:24    Anti-infectives: Anti-infectives (From admission, onward)    None        Assessment/Plan SBO - Appears to be clinically and radiographically resolving. Contrast in colon. No abdominal pain, n/v and has ROBF. ND/NT on exam with good bowel sounds. Can be place on CLD and diet advanced as tolerated from our standpoint. Family reports they were told he may have an endoscopy today? Will reach out to primary to ask about this.  FEN - Okay to adv diet as above VTE - SCDs, okay for chem ppx from our standpoint ID - None   LOS: 1 day    MJillyn Ledger, PSunset Surgical [facility] LLCSurgery 11/06/2021, 12:10 PM Please see Amion for pager number during day hours 7:00am-4:30pm

## 2021-11-07 ENCOUNTER — Inpatient Hospital Stay (HOSPITAL_COMMUNITY): Payer: Medicare (Managed Care) | Admitting: Anesthesiology

## 2021-11-07 ENCOUNTER — Inpatient Hospital Stay (HOSPITAL_COMMUNITY): Payer: Medicare (Managed Care)

## 2021-11-07 ENCOUNTER — Encounter (HOSPITAL_COMMUNITY)
Admission: EM | Disposition: A | Discharge status: Home / Self Care | Payer: Self-pay | Source: Home / Self Care | Attending: Internal Medicine

## 2021-11-07 ENCOUNTER — Encounter (HOSPITAL_COMMUNITY): Payer: Self-pay | Admitting: Internal Medicine

## 2021-11-07 DIAGNOSIS — R1013 Epigastric pain: Secondary | ICD-10-CM | POA: Diagnosis present

## 2021-11-07 DIAGNOSIS — Z87891 Personal history of nicotine dependence: Secondary | ICD-10-CM

## 2021-11-07 DIAGNOSIS — I10 Essential (primary) hypertension: Secondary | ICD-10-CM

## 2021-11-07 DIAGNOSIS — R112 Nausea with vomiting, unspecified: Secondary | ICD-10-CM | POA: Diagnosis present

## 2021-11-07 DIAGNOSIS — I2510 Atherosclerotic heart disease of native coronary artery without angina pectoris: Secondary | ICD-10-CM

## 2021-11-07 DIAGNOSIS — K449 Diaphragmatic hernia without obstruction or gangrene: Secondary | ICD-10-CM

## 2021-11-07 DIAGNOSIS — K56609 Unspecified intestinal obstruction, unspecified as to partial versus complete obstruction: Secondary | ICD-10-CM | POA: Diagnosis not present

## 2021-11-07 HISTORY — PX: ENTEROSCOPY: SHX5533

## 2021-11-07 LAB — CBC
HCT: 34.2 % — ABNORMAL LOW (ref 39.0–52.0)
Hemoglobin: 11.3 g/dL — ABNORMAL LOW (ref 13.0–17.0)
MCH: 31.8 pg (ref 26.0–34.0)
MCHC: 33 g/dL (ref 30.0–36.0)
MCV: 96.3 fL (ref 80.0–100.0)
Platelets: 233 10*3/uL (ref 150–400)
RBC: 3.55 MIL/uL — ABNORMAL LOW (ref 4.22–5.81)
RDW: 12.6 % (ref 11.5–15.5)
WBC: 7.8 10*3/uL (ref 4.0–10.5)
nRBC: 0 % (ref 0.0–0.2)

## 2021-11-07 LAB — BASIC METABOLIC PANEL
Anion gap: 7 (ref 5–15)
BUN: 20 mg/dL (ref 8–23)
CO2: 28 mmol/L (ref 22–32)
Calcium: 8.5 mg/dL — ABNORMAL LOW (ref 8.9–10.3)
Chloride: 102 mmol/L (ref 98–111)
Creatinine, Ser: 1.08 mg/dL (ref 0.61–1.24)
GFR, Estimated: >60 mL/min (ref >60)
Glucose, Bld: 92 mg/dL (ref 70–99)
Potassium: 3.9 mmol/L (ref 3.5–5.1)
Sodium: 137 mmol/L (ref 135–145)

## 2021-11-07 LAB — TSH: TSH: 1.18 u[IU]/mL (ref 0.350–4.500)

## 2021-11-07 LAB — T4, FREE: Free T4: 1.46 ng/dL — ABNORMAL HIGH (ref 0.61–1.12)

## 2021-11-07 LAB — MAGNESIUM: Magnesium: 2 mg/dL (ref 1.7–2.4)

## 2021-11-07 LAB — VITAMIN B12: Vitamin B-12: 582 pg/mL (ref 180–914)

## 2021-11-07 SURGERY — ENTEROSCOPY
Anesthesia: Monitor Anesthesia Care

## 2021-11-07 MED ORDER — ACETAMINOPHEN 10 MG/ML IV SOLN: 1000.0000 mg | Freq: Once | INTRAVENOUS | Status: DC | PRN

## 2021-11-07 MED ORDER — ACETAMINOPHEN 160 MG/5ML PO SOLN: 325.0000 mg | ORAL | Status: DC | PRN

## 2021-11-07 MED ORDER — PROMETHAZINE HCL 25 MG/ML IJ SOLN: 6.2500 mg | INTRAMUSCULAR | Status: DC | PRN

## 2021-11-07 MED ORDER — ACETAMINOPHEN 325 MG PO TABS: 325.0000 mg | ORAL_TABLET | ORAL | Status: DC | PRN

## 2021-11-07 MED ORDER — LIDOCAINE 2% (20 MG/ML) 5 ML SYRINGE
INTRAMUSCULAR | Status: DC | PRN
  Administered 2021-11-07: 60 mg via INTRAVENOUS
  Administered 2021-11-07: 40 mg via INTRAVENOUS

## 2021-11-07 MED ORDER — PROPOFOL 10 MG/ML IV BOLUS
INTRAVENOUS | Status: DC | PRN
  Administered 2021-11-07 (×2): 40 mg via INTRAVENOUS

## 2021-11-07 MED ORDER — FENTANYL CITRATE (PF) 100 MCG/2ML IJ SOLN: 25.0000 ug | INTRAMUSCULAR | Status: DC | PRN

## 2021-11-07 MED ORDER — PROPOFOL 500 MG/50ML IV EMUL
INTRAVENOUS | Status: DC | PRN
  Administered 2021-11-07: 60 ug/kg/min via INTRAVENOUS

## 2021-11-07 MED ORDER — METOPROLOL SUCCINATE ER 25 MG PO TB24: 25.0000 mg | ORAL_TABLET | Freq: Every day | ORAL | 0 refills | Status: DC

## 2021-11-07 MED ORDER — LACTATED RINGERS IV SOLN
INTRAVENOUS | Status: AC | PRN
  Administered 2021-11-07: 1000 mL via INTRAVENOUS

## 2021-11-07 MED ORDER — OXYCODONE HCL 5 MG/5ML PO SOLN: 5.0000 mg | Freq: Once | ORAL | Status: DC | PRN

## 2021-11-07 MED ORDER — OXYCODONE HCL 5 MG PO TABS: 5.0000 mg | ORAL_TABLET | Freq: Once | ORAL | Status: DC | PRN

## 2021-11-07 MED ORDER — GLYCOPYRROLATE PF 0.2 MG/ML IJ SOSY
PREFILLED_SYRINGE | INTRAMUSCULAR | Status: DC | PRN
  Administered 2021-11-07: .2 mg via INTRAVENOUS

## 2021-11-07 MED ORDER — AMISULPRIDE (ANTIEMETIC) 5 MG/2ML IV SOLN: 10.0000 mg | Freq: Once | INTRAVENOUS | Status: DC | PRN

## 2021-11-07 MED ORDER — IOHEXOL 350 MG/ML SOLN
100.0000 mL | Freq: Once | INTRAVENOUS | Status: AC | PRN
  Administered 2021-11-07: 100 mL via INTRAVENOUS

## 2021-11-07 NOTE — Transfer of Care (Signed)
Immediate Anesthesia Transfer of Care Note  Patient: Austin [name]  Procedure(s) Performed: ENTEROSCOPY  Patient Location: PACU  Anesthesia Type:MAC  Level of Consciousness: awake  Airway & Oxygen Therapy: Patient Spontanous Breathing  Post-op Assessment: Report given to RN and Post -op Vital signs reviewed and stable  Post vital signs: Reviewed and stable  Last Vitals:  Vitals Value Taken Time  BP 114/61 11/07/21 1003  Temp    Pulse 79 11/07/21 1006  Resp 22 11/07/21 1006  SpO2 95 % 11/07/21 1006  Vitals shown include unvalidated device data.  Last Pain:  Vitals:   11/07/21 0845  TempSrc: Tympanic  PainSc: 0-No pain         Complications: No notable events documented.

## 2021-11-07 NOTE — Op Note (Signed)
[facility] Patient Name: Austin [name] Procedure Date : 11/07/2021 MRN: [id number] Attending MD: Lear Ng , MD Date of Birth: [birth year] CSN: [id number] Age: 81 Admit Type: Inpatient Procedure:                Small bowel enteroscopy Indications:              Unexplained epigastric abdominal distress/pain,                            Abnormal abdominal CT, Small bowel obstruction Providers:                Lear Ng, MD, Jaci Carrel, RN, Darliss Cheney, Technician Referring MD:             hospital team Medicines:                Propofol per Anesthesia, Monitored Anesthesia Care Complications:            No immediate complications. Estimated Blood Loss:     Estimated blood loss: none. Procedure:                After obtaining informed consent, the endoscope was                            passed under direct vision. Throughout the                            procedure, the patient's blood pressure, pulse, and                            oxygen saturations were monitored continuously. The                            GIF-H190 ([id number]) Olympus endoscope was introduced                            through the mouth and advanced to the second part                            of duodenum. The SIF-Q180 ([id number]) Olympus                            enteroscope was introduced through the mouth and                            advanced to the jejunum, to the 170 cm mark (from                            the incisors). The small bowel enteroscopy was                            performed with difficulty due to significant  looping. Successful completion of the procedure was                            aided by straightening and shortening the scope to                            obtain bowel loop reduction. The patient tolerated                            the procedure well. Scope In: Scope Out: Findings:      The examined  esophagus was normal.      The Z-line was regular and was found 36 cm from the incisors.      A medium-sized hiatal hernia was present.      There was no evidence of significant pathology in the duodenal bulb, in       the second portion of the duodenum, in the third portion of the duodenum       and in the fourth portion of the duodenum.      There was no evidence of significant pathology in the entire examined       portion of jejunum.      Unable to advance enteroscope distal to 170 cm from the incisors due to       excessive looping. Impression:               - Normal esophagus.                           - Z-line regular, 36 cm from the incisors.                           - Medium-sized hiatal hernia.                           - Normal duodenal bulb, second portion of the                            duodenum, third portion of the duodenum and fourth                            portion of the duodenum.                           - The examined portion of the jejunum was normal.                           - No specimens collected. Recommendation:           - NPO.                           - Perform a computed tomographic (CT scan)                            enterography. Procedure Code(s):        --- Professional ---  [phone], Small intestinal endoscopy, enteroscopy                            beyond second portion of duodenum, not including                            ileum; diagnostic, including collection of                            specimen(s) by brushing or washing, when performed                            (separate procedure) Diagnosis Code(s):        --- Professional ---                           R93.3, Abnormal findings on diagnostic imaging of                            other parts of digestive tract                           K56.609, Unspecified intestinal obstruction,                            unspecified as to partial versus complete                             obstruction                           R10.13, Epigastric pain                           K44.9, Diaphragmatic hernia without obstruction or                            gangrene CPT copyright 2019 [employer]. All rights reserved. The codes documented in this report are preliminary and upon coder review may  be revised to meet current compliance requirements. Lear Ng, MD 11/07/2021 10:10:43 AM This report has been signed electronically. Number of Addenda: 0

## 2021-11-07 NOTE — Anesthesia Postprocedure Evaluation (Signed)
Anesthesia Post Note  Patient: Austin [name]  Procedure(s) Performed: ENTEROSCOPY     Patient location during evaluation: PACU Anesthesia Type: MAC Level of consciousness: awake and alert Pain management: pain level controlled Vital Signs Assessment: post-procedure vital signs reviewed and stable Respiratory status: spontaneous breathing, nonlabored ventilation, respiratory function stable and patient connected to nasal cannula oxygen Cardiovascular status: stable and blood pressure returned to baseline Postop Assessment: no apparent nausea or vomiting Anesthetic complications: no   No notable events documented.  Last Vitals:  Vitals:   11/07/21 1035 11/07/21 1055  BP:    Pulse: 67   Resp: (!) 21   Temp:  36.7 C  SpO2: 93%     Last Pain:  Vitals:   11/07/21 1025  TempSrc:   PainSc: 0-No pain                 [name]

## 2021-11-07 NOTE — TOC CM/SW Note (Signed)
  Transition of Care [facility]) Screening Note   Patient Details  Name: Austin [name] Date of Birth: 16-Mar-1940    Transition of Care Department [facility]) has reviewed patient and no TOC needs have been identified at this time. We will continue to monitor patient advancement through interdisciplinary progression rounds. If new patient transition needs arise, please place a TOC consult.

## 2021-11-07 NOTE — Progress Notes (Signed)
Day of Surgery  Subjective: No acute changes. Recently returned from EGD. CT enterography pending. Denies nausea/vomiting. Having bowel movements.  Objective: Vital signs in last 24 hours: Temp:  [97.8 F (36.6 C)-98.6 F (37 C)] 98.6 F (37 C) (09/25 1613) Pulse Rate:  [43-72] 43 (09/25 1613) Resp:  [15-21] 16 (09/25 1613) BP: (110-148)/(56-81) 134/78 (09/25 1613) SpO2:  [87 %-100 %] 87 % (09/25 1613) Weight:  [57.2 kg] 57.2 kg (09/25 0845) Last BM Date : 11/06/21  Intake/Output from previous day: 09/24 0701 - 09/25 0700 In: 2712.3 [P.O.:180; I.V.:2532.3] Out: -  Intake/Output this shift: Total I/O In: 591.7 [P.O.:120; I.V.:471.7] Out: -   PE: Gen:  Alert, NAD Abd: Soft, ND, NT  Lab Results:  Recent Labs    11/06/21 0339 11/07/21 0035  WBC 15.5* 7.8  HGB 14.7 11.3*  HCT 44.7 34.2*  PLT 313 233   BMET Recent Labs    11/06/21 0339 11/07/21 0035  NA 142 137  K 4.1 3.9  CL 103 102  CO2 27 28  GLUCOSE 121* 92  BUN 22 20  CREATININE 1.32* 1.08  CALCIUM 9.7 8.5*   PT/INR No results for input(s): "LABPROT", "INR" in the last 72 hours. CMP     Component Value Date/Time   NA 137 11/07/2021 0035   K 3.9 11/07/2021 0035   CL 102 11/07/2021 0035   CO2 28 11/07/2021 0035   GLUCOSE 92 11/07/2021 0035   BUN 20 11/07/2021 0035   CREATININE 1.08 11/07/2021 0035   CALCIUM 8.5 (L) 11/07/2021 0035   PROT 6.6 11/06/2021 0339   ALBUMIN 4.0 11/06/2021 0339   AST 19 11/06/2021 0339   ALT 22 11/06/2021 0339   ALKPHOS 68 11/06/2021 0339   BILITOT 1.1 11/06/2021 0339   GFRNONAA >60 11/07/2021 0035   GFRAA  11/06/2008 1030    >60        The eGFR has been calculated using the MDRD equation. This calculation has not been validated in all clinical situations. eGFR's persistently <60 mL/min signify possible Chronic Kidney Disease.   Lipase     Component Value Date/Time   LIPASE 28 11/05/2021 1600    Studies/Results: CT ENTERO ABD/PELVIS W  CONTAST  Result Date: 11/07/2021 CLINICAL DATA:  Nausea.  Concern for small bowel obstruction. EXAM: CT ABDOMEN AND PELVIS WITH CONTRAST (ENTEROGRAPHY) TECHNIQUE: Multidetector CT of the abdomen and pelvis during bolus administration of intravenous contrast. Negative oral contrast was given. RADIATION DOSE REDUCTION: This exam was performed according to the departmental dose-optimization program which includes automated exposure control, adjustment of the mA and/or kV according to patient size and/or use of iterative reconstruction technique. CONTRAST:  190m OMNIPAQUE IOHEXOL 350 MG/ML SOLN COMPARISON:  11/05/2021 FINDINGS: Lower chest:  Advanced changes of centrilobular emphysema. Hepatobiliary: Peripherally enhancing lesion within segment 4 a measures 7 mm and likely represents a small hemangioma, image 9/3. Low-density structure within inferior medial right lobe of liver measures 7 mm and is too small to characterize. There is been vicarious excretion of contrast material into the gallbladder. No signs of gallbladder inflammation. No bile duct dilatation. Pancreas: No mass, inflammatory changes, or other significant abnormality. Spleen: Within normal limits in size and appearance. Adrenals/Urinary Tract: Normal adrenal glands. No signs of nephrolithiasis or hydronephrosis. Bilateral Bosniak class 1 and 2 kidney cysts noted. No follow-up imaging recommended. There is moderate diffuse distension of the urinary bladder containing excreted contrast material. No focal bladder lesion identified. Stomach/Bowel: Small to moderate hiatal hernia identified. Interval  decrease in small bowel dilatation compatible with resolution of bowel obstruction. There is enteric contrast material identified throughout the colon up to the level of the rectum. There is a unusual appearance of asymmetric low-attenuation adjacent to the base of cecum and along the lateral wall of the terminal ileum measuring 4.1 x 2.2 cm, image 112/5.  This may be encasing the appendix, image 112/5 and image 51/7. Primary differential considerations include chronic low-attenuation wall thickening due to chronic colitis, decompressed cecum, or low density neoplasm such as mucinous adenocarcinoma. Sigmoid diverticulosis identified without signs of acute diverticulitis. Vascular/Lymphatic: Aortic atherosclerosis. No aneurysm. No signs of abdominopelvic adenopathy. Reproductive: No mass or other significant abnormality. Other: No free fluid or fluid collections identified. Musculoskeletal: No acute or suspicious osseous findings. Degenerative disc disease noted within the lumbar spine. IMPRESSION: 1. Interval decrease in small bowel dilatation compatible with resolution of small bowel obstruction. 2. There is a unusual appearance of asymmetric low-attenuation adjacent to the base of cecum and along the lateral wall of the terminal ileum, which may be encasing the appendix. Primary differential considerations include chronic low-attenuation wall thickening due to colitis, unusual appearance of decompressed cecum, or low density neoplasm such as mucinous adenocarcinoma. Correlation with colon cancer screening advised. 3. Aortic Atherosclerosis (ICD10-I70.0) and Emphysema (ICD10-J43.9). Electronically Signed   By: Kerby Moors M.D.   On: 11/07/2021 16:00   DG Abd 2 Views  Result Date: 11/06/2021 CLINICAL DATA:  81 year old male with abdominal pain and small-bowel obstruction. EXAM: ABDOMEN - 2 VIEW COMPARISON:  11/05/2021 and earlier. FINDINGS: Upright and supine views of the abdomen and pelvis at 0909 hours. Enteric tube has been removed. Oral contrast administered since the CT Abdomen and Pelvis on 11/05/2021 is now present in the right colon. Excreted IV contrast also in the urinary bladder. Nonobstructed bowel-gas pattern now. No pneumoperitoneum. Negative lung bases. Normal cardiac size. No acute osseous abnormality identified. IMPRESSION: 1. Resolved small  bowel obstruction since yesterday with enteric contrast now in the right colon. No pneumoperitoneum. 2. Removed enteric tube. Electronically Signed   By: Genevie Ann M.D.   On: 11/06/2021 09:24   DG Abdomen 1 View  Result Date: 11/05/2021 CLINICAL DATA:  NG-tube EXAM: ABDOMEN - 1 VIEW COMPARISON:  Abdominal x-ray 11/05/2021 FINDINGS: Nasogastric tube tip is now at the level of the distal stomach. Dilated small bowel loops are again seen. Lung bases are clear. IMPRESSION: Nasogastric tube tip now at the level of the distal stomach. Electronically Signed   By: Ronney Asters M.D.   On: 11/05/2021 21:59   DG Abd Portable 1V-Small Bowel Protocol-Position Verification  Result Date: 11/05/2021 CLINICAL DATA:  NG tube placement EXAM: PORTABLE ABDOMEN - 1 VIEW COMPARISON:  CT abdomen and pelvis earlier today FINDINGS: Interval placement of enteric tube. The enteric tube is looped in the stomach with tip directed superiorly likely within the distal esophagus. Repositioning is recommended prior to use. Partially visualized dilated loops of small bowel compatible with obstruction as seen on CT earlier today. IMPRESSION: Enteric tube tip directed superiorly within the distal esophagus. Recommend repositioning prior to use. Dilated loops of small bowel compatible with obstruction. Electronically Signed   By: Placido Sou M.D.   On: 11/05/2021 21:19   CT ABDOMEN PELVIS W CONTRAST  Result Date: 11/05/2021 CLINICAL DATA:  Abdominal pain. EXAM: CT ABDOMEN AND PELVIS WITH CONTRAST TECHNIQUE: Multidetector CT imaging of the abdomen and pelvis was performed using the standard protocol following bolus administration of intravenous contrast. RADIATION DOSE REDUCTION:  This exam was performed according to the departmental dose-optimization program which includes automated exposure control, adjustment of the mA and/or kV according to patient size and/or use of iterative reconstruction technique. CONTRAST:  43m OMNIPAQUE IOHEXOL  350 MG/ML SOLN COMPARISON:  None Available. FINDINGS: Lower chest: Moderate emphysema. Hepatobiliary: 2 few mm hypoattenuated lesions in the liver, too small to be actually characterize by CT. Decompressed gallbladder. Pancreas: Unremarkable. No pancreatic ductal dilatation or surrounding inflammatory changes. Spleen: Normal in size without focal abnormality. Adrenals/Urinary Tract: Normal adrenal glands. 2.2 cm renal cyst in the midpole region of the right kidney. Smaller less than 1 cm probable cysts in the lower pole of the left kidney. Normal urinary bladder. Stomach/Bowel: Normal appearance of the stomach. Fluid-filled distended small bowel loops measure up to 4 cm with a transitional point in the right mid abdomen. Diffuse left colonic diverticulosis with associated long segment circumferential mucosal wall thickening of the descending and sigmoid colon suggestive of chronic colitis. No significant pericolonic inflammatory changes. Vascular/Lymphatic: Aortic atherosclerosis. No enlarged abdominal or pelvic lymph nodes. Reproductive: Normal sized of the prostate gland. Other: No abdominal wall hernia or abnormality. No abdominopelvic ascites. Musculoskeletal: Spondylosis of the lumbosacral spine. IMPRESSION: 1. Small bowel obstruction with transitional point in the right mid abdomen. 2. Diffuse left colonic diverticulosis with associated long segment circumferential mucosal wall thickening of the descending and sigmoid colon suggestive of chronic colitis. No significant pericolonic inflammatory changes. 3. 2 few mm hypoattenuated lesions in the liver, too small to be actually characterize by CT. 4. Benign right renal cyst. 5. Moderate emphysema. Aortic Atherosclerosis (ICD10-I70.0) and Emphysema (ICD10-J43.9). Electronically Signed   By: DFidela SalisburyM.D.   On: 11/05/2021 19:24    Anti-infectives: Anti-infectives (From admission, onward)    None        Assessment/Plan SBO - Obstruction  resolved, having bowel function. EGD today was normal. CT enterography pending, will follow up results. From a surgical standpoint patient may have diet advanced as tolerated.    LOS: 2 days    SDwan Bolt, MRainsvilleSurgery 11/07/2021, 5:53 PM Please see Amion for pager number during day hours 7:00am-4:30pm

## 2021-11-07 NOTE — Anesthesia Procedure Notes (Signed)
Procedure Name: MAC Date/Time: 11/07/2021 9:28 AM  Performed by: Lorie Phenix, CRNAPre-anesthesia Checklist: Patient identified, Emergency Drugs available, Suction available and Patient being monitored Patient Re-evaluated:Patient Re-evaluated prior to induction Oxygen Delivery Method: Nasal cannula Placement Confirmation: positive ETCO2

## 2021-11-07 NOTE — Interval H&P Note (Signed)
History and Physical Interval Note:  11/07/2021 9:14 AM  Austin [name]  has presented today for surgery, with the diagnosis of nausea, vomiting.  The various methods of treatment have been discussed with the patient and family. After consideration of risks, benefits and other options for treatment, the patient has consented to  Procedure(s): ENTEROSCOPY (N/A) as a surgical intervention.  The patient's history has been reviewed, patient examined, no change in status, stable for surgery.  I have reviewed the patient's chart and labs.  Questions were answered to the patient's satisfaction.     Lear Ng

## 2021-11-07 NOTE — Anesthesia Preprocedure Evaluation (Addendum)
Anesthesia Evaluation  Patient identified by MRN, date of birth, ID band Patient awake    Reviewed: Allergy & Precautions, NPO status , Patient's Chart, lab work & pertinent test results  Airway Mallampati: I  TM Distance: >3 FB Neck ROM: Full    Dental  (+) Edentulous Upper, Edentulous Lower   Pulmonary COPD,  COPD inhaler, former smoker,     + decreased breath sounds      Cardiovascular hypertension, Pt. on medications and Pt. on home beta blockers + CAD and + Cardiac Stents   Rhythm:Regular Rate:Bradycardia     Neuro/Psych negative neurological ROS  negative psych ROS   GI/Hepatic Neg liver ROS, GERD  Medicated,  Endo/Other  negative endocrine ROS  Renal/GU negative Renal ROS     Musculoskeletal negative musculoskeletal ROS (+)   Abdominal Normal abdominal exam  (+)   Peds  Hematology negative hematology ROS (+)   Anesthesia Other Findings   Reproductive/Obstetrics                            Anesthesia Physical Anesthesia Plan  ASA: 3  Anesthesia Plan: MAC   Post-op Pain Management:    Induction: Intravenous  PONV Risk Score and Plan: 0 and Propofol infusion  Airway Management Planned: Natural Airway and Simple Face Mask  Additional Equipment: None  Intra-op Plan:   Post-operative Plan:   Informed Consent: I have reviewed the patients History and Physical, chart, labs and discussed the procedure including the risks, benefits and alternatives for the proposed anesthesia with the patient or authorized representative who has indicated his/her understanding and acceptance.       Plan Discussed with: CRNA  Anesthesia Plan Comments:        Anesthesia Quick Evaluation

## 2021-11-08 ENCOUNTER — Encounter (HOSPITAL_COMMUNITY): Payer: Self-pay | Admitting: Gastroenterology

## 2021-11-08 DIAGNOSIS — N179 Acute kidney failure, unspecified: Secondary | ICD-10-CM | POA: Diagnosis present

## 2021-11-08 DIAGNOSIS — R933 Abnormal findings on diagnostic imaging of other parts of digestive tract: Secondary | ICD-10-CM | POA: Diagnosis present

## 2021-11-08 DIAGNOSIS — K529 Noninfective gastroenteritis and colitis, unspecified: Secondary | ICD-10-CM | POA: Diagnosis present

## 2021-11-08 DIAGNOSIS — R634 Abnormal weight loss: Secondary | ICD-10-CM | POA: Diagnosis present

## 2021-11-08 NOTE — Discharge Summary (Signed)
Physician Discharge Summary   Patient: Austin [name] MRN: [id number] DOB: 1940-06-06  Admit date:     11/05/2021  Discharge date: 11/07/2021  Discharge Physician: Berle Mull  PCP: [facility]  Recommendations at discharge: Follow-up with PCP in 1 week. Follow-up with cardiology and GI. Patient will require a repeat colonoscopy in 1 month.  Cardiology Dr. Angelena Form has recommended that it would be safe to stop taking Plavix 5 days before the colonoscopy.   Follow-up [facility]. Schedule an appointment as soon as possible for a visit in 1 week(s).   Specialty: General Practice Contact information: [place] [address] [phone]         Burnell Blanks, MD .   Specialty: Cardiology Contact information: [address] [place] [address] [phone]         Gastroenterology, [facility]. Schedule an appointment as soon as possible for a visit in 1 month(s).   Why: need colonoscopy. Contact information: [address] [place] [address] [phone]                Discharge Diagnoses: Principal Problem:   Small bowel obstruction ([place]) Active Problems:   CAD (coronary artery disease)   Epigastric abdominal pain   Nausea and vomiting   Chronic colitis   Weight loss, unintentional   AKI (acute kidney injury) ([place])   Abnormal computed tomography of cecum and terminal ileum  Hospital Course: PMH of COPD, HTN, CAD, GERD, anxiety, depression presented to the hospital with complaints of chronic weight loss with progressive nausea vomiting and diarrhea. Found to have small bowel obstruction as well as chronic colitis on the CT scan. General surgery was consulted, NG tube was inserted.  Patient removed the NG tube as of 9/24 in the morning.  Follow-up x-ray actually shows resolution of SBO.  General surgery signed off. GI was also consulted and performing EGD/enteroscopy on  9/25.  Assessment and Plan  Small bowel obstruction Chronic colitis. No prior abdominal surgeries. NG tube was inserted, small bowel protocol was initiated. Patient started to have bowel movement.  Patient removed NG tube himself. Repeat x-ray on 9/24 shows resolution of the SBO. General surgery currently signed off. Tolerating oral diet. Underwent upper GI endoscopy push enteroscopy which was unremarkable. Following this patient underwent CT enteroscopy. Following which patient was able to tolerate p.o. diet and therefore was discharged home at his request.  Abnormal CT scan of cecum. Patient underwent CT enterography which showed unusual appearance at the base of cecum and terminal ileum concerning for colitis or low-density neoplasm such as mucinous adenocarcinoma. Patient will require colonoscopy. Cardiology has recommended patient can safely be off of Plavix for 5 days before colonoscopy.  [facility] will arrange outpatient colonoscopy.   Intractable nausea vomiting, unintentional weight loss Intermittent diarrhea for years with worsening recently. Continue daily IV Protonix GI consulted Underwent enteroscopy.  Outpatient follow-up for colonoscopy recommended.   AKI, likely prerenal from dehydration Dehydration in the setting of intractable nausea and vomiting. Treated with IV fluids. Now resolved   Leukocytosis, suspect reactive in the setting of small bowel obstruction   CAD. Patient is on Plavix.  Resume.  Underweight. Body mass index is 18.09 kg/m.  Placing the patient at high risk of poor outcome. Patient will need colonoscopy for further work-up of weight loss and abnormal appearance of cecum on the CT scan.  Consultants:  GI  Procedures performed:  Upper GI endoscopy and push enteroscopy  DISCHARGE MEDICATION: Allergies as of 11/07/2021   No Known Allergies      Medication List     TAKE these medications    atorvastatin 40 MG tablet Commonly known  as: LIPITOR Take 1 tablet (40 mg total) by mouth at bedtime.   clopidogrel 75 MG tablet Commonly known as: PLAVIX Take 1 tablet (75 mg total) by mouth daily.   famotidine 40 MG tablet Commonly known as: PEPCID Take 1 tablet (40 mg total) by mouth daily.   guaiFENesin 600 MG 12 hr tablet Commonly known as: MUCINEX Take 600 mg by mouth 2 (two) times daily as needed for to loosen phlegm.   losartan 25 MG tablet Commonly known as: COZAAR Take 1 tablet (25 mg total) by mouth daily.   metoprolol succinate 25 MG 24 hr tablet Commonly known as: TOPROL-XL Take 1 tablet (25 mg total) by mouth daily. What changed:  medication strength how much to take how to take this when to take this additional instructions   multivitamin with minerals tablet Take 1 tablet by mouth daily.   nitroGLYCERIN 0.4 MG SL tablet Commonly known as: NITROSTAT Place 1 tablet (0.4 mg total) under the tongue as directed. What changed:  when to take this reasons to take this   ProAir HFA 108 (90 Base) MCG/ACT inhaler Generic drug: albuterol Inhale 1 puff into the lungs every 4 (four) hours as needed for shortness of breath.   Spiriva Respimat 2.5 MCG/ACT Aers Generic drug: Tiotropium Bromide Monohydrate Inhale 2 puffs into the lungs daily.   Symbicort 160-4.5 MCG/ACT inhaler Generic drug: budesonide-formoterol Inhale 2 puffs into the lungs daily.   triamcinolone cream 0.1 % Commonly known as: KENALOG Apply 1 Application topically 2 (two) times daily.       Disposition: Home Diet recommendation: Cardiac diet  Discharge Exam: Vitals:   11/07/21 1030 11/07/21 1035 11/07/21 1055 11/07/21 1613  BP: 133/81   134/78  Pulse:  67  (!) 43  Resp: 19 (!) 21  16  Temp:   98 F (36.7 C) 98.6 F (37 C)  TempSrc:    Oral  SpO2:  93%  (!) 87%  Weight:      Height:       General: Appear in no distress; no visible Abnormal Neck Mass Or lumps, Conjunctiva normal Cardiovascular: S1 and S2 Present, no  Murmur, Respiratory: good respiratory effort, Bilateral Air entry present and CTA, no Crackles, no wheezes Abdomen: Bowel Sound present, Non tender  Extremities: no Pedal edema Neurology: alert and oriented to time, place, and person  Filed Weights   11/05/21 1553 11/07/21 0845  Weight: 57.2 kg 57.2 kg   Condition at discharge: stable  The results of significant diagnostics from this hospitalization (including imaging, microbiology, ancillary and laboratory) are listed below for reference.   Imaging Studies: CT ENTERO ABD/PELVIS W CONTAST  Result Date: 11/07/2021 CLINICAL DATA:  Nausea.  Concern for small bowel obstruction. EXAM: CT ABDOMEN AND PELVIS WITH CONTRAST (ENTEROGRAPHY) TECHNIQUE: Multidetector CT of the abdomen and pelvis during bolus administration of intravenous contrast. Negative oral contrast was given. RADIATION DOSE REDUCTION: This exam was performed according to the departmental dose-optimization program which includes automated exposure control, adjustment of the mA and/or kV according to patient size and/or use of iterative reconstruction technique. CONTRAST:  134m OMNIPAQUE IOHEXOL 350 MG/ML SOLN COMPARISON:  11/05/2021 FINDINGS: Lower chest:  Advanced changes of centrilobular emphysema. Hepatobiliary: Peripherally enhancing lesion within segment 4 a measures  7 mm and likely represents a small hemangioma, image 9/3. Low-density structure within inferior medial right lobe of liver measures 7 mm and is too small to characterize. There is been vicarious excretion of contrast material into the gallbladder. No signs of gallbladder inflammation. No bile duct dilatation. Pancreas: No mass, inflammatory changes, or other significant abnormality. Spleen: Within normal limits in size and appearance. Adrenals/Urinary Tract: Normal adrenal glands. No signs of nephrolithiasis or hydronephrosis. Bilateral Bosniak class 1 and 2 kidney cysts noted. No follow-up imaging recommended. There is  moderate diffuse distension of the urinary bladder containing excreted contrast material. No focal bladder lesion identified. Stomach/Bowel: Small to moderate hiatal hernia identified. Interval decrease in small bowel dilatation compatible with resolution of bowel obstruction. There is enteric contrast material identified throughout the colon up to the level of the rectum. There is a unusual appearance of asymmetric low-attenuation adjacent to the base of cecum and along the lateral wall of the terminal ileum measuring 4.1 x 2.2 cm, image 112/5. This may be encasing the appendix, image 112/5 and image 51/7. Primary differential considerations include chronic low-attenuation wall thickening due to chronic colitis, decompressed cecum, or low density neoplasm such as mucinous adenocarcinoma. Sigmoid diverticulosis identified without signs of acute diverticulitis. Vascular/Lymphatic: Aortic atherosclerosis. No aneurysm. No signs of abdominopelvic adenopathy. Reproductive: No mass or other significant abnormality. Other: No free fluid or fluid collections identified. Musculoskeletal: No acute or suspicious osseous findings. Degenerative disc disease noted within the lumbar spine. IMPRESSION: 1. Interval decrease in small bowel dilatation compatible with resolution of small bowel obstruction. 2. There is a unusual appearance of asymmetric low-attenuation adjacent to the base of cecum and along the lateral wall of the terminal ileum, which may be encasing the appendix. Primary differential considerations include chronic low-attenuation wall thickening due to colitis, unusual appearance of decompressed cecum, or low density neoplasm such as mucinous adenocarcinoma. Correlation with colon cancer screening advised. 3. Aortic Atherosclerosis (ICD10-I70.0) and Emphysema (ICD10-J43.9). Electronically Signed   By: Kerby Moors M.D.   On: 11/07/2021 16:00   DG Abd 2 Views  Result Date: 11/06/2021 CLINICAL DATA:  81 year old  male with abdominal pain and small-bowel obstruction. EXAM: ABDOMEN - 2 VIEW COMPARISON:  11/05/2021 and earlier. FINDINGS: Upright and supine views of the abdomen and pelvis at 0909 hours. Enteric tube has been removed. Oral contrast administered since the CT Abdomen and Pelvis on 11/05/2021 is now present in the right colon. Excreted IV contrast also in the urinary bladder. Nonobstructed bowel-gas pattern now. No pneumoperitoneum. Negative lung bases. Normal cardiac size. No acute osseous abnormality identified. IMPRESSION: 1. Resolved small bowel obstruction since yesterday with enteric contrast now in the right colon. No pneumoperitoneum. 2. Removed enteric tube. Electronically Signed   By: Genevie Ann M.D.   On: 11/06/2021 09:24   DG Abdomen 1 View  Result Date: 11/05/2021 CLINICAL DATA:  NG-tube EXAM: ABDOMEN - 1 VIEW COMPARISON:  Abdominal x-ray 11/05/2021 FINDINGS: Nasogastric tube tip is now at the level of the distal stomach. Dilated small bowel loops are again seen. Lung bases are clear. IMPRESSION: Nasogastric tube tip now at the level of the distal stomach. Electronically Signed   By: Ronney Asters M.D.   On: 11/05/2021 21:59   DG Abd Portable 1V-Small Bowel Protocol-Position Verification  Result Date: 11/05/2021 CLINICAL DATA:  NG tube placement EXAM: PORTABLE ABDOMEN - 1 VIEW COMPARISON:  CT abdomen and pelvis earlier today FINDINGS: Interval placement of enteric tube. The enteric tube is looped in the stomach  with tip directed superiorly likely within the distal esophagus. Repositioning is recommended prior to use. Partially visualized dilated loops of small bowel compatible with obstruction as seen on CT earlier today. IMPRESSION: Enteric tube tip directed superiorly within the distal esophagus. Recommend repositioning prior to use. Dilated loops of small bowel compatible with obstruction. Electronically Signed   By: Placido Sou M.D.   On: 11/05/2021 21:19   CT ABDOMEN PELVIS W  CONTRAST  Result Date: 11/05/2021 CLINICAL DATA:  Abdominal pain. EXAM: CT ABDOMEN AND PELVIS WITH CONTRAST TECHNIQUE: Multidetector CT imaging of the abdomen and pelvis was performed using the standard protocol following bolus administration of intravenous contrast. RADIATION DOSE REDUCTION: This exam was performed according to the departmental dose-optimization program which includes automated exposure control, adjustment of the mA and/or kV according to patient size and/or use of iterative reconstruction technique. CONTRAST:  67m OMNIPAQUE IOHEXOL 350 MG/ML SOLN COMPARISON:  None Available. FINDINGS: Lower chest: Moderate emphysema. Hepatobiliary: 2 few mm hypoattenuated lesions in the liver, too small to be actually characterize by CT. Decompressed gallbladder. Pancreas: Unremarkable. No pancreatic ductal dilatation or surrounding inflammatory changes. Spleen: Normal in size without focal abnormality. Adrenals/Urinary Tract: Normal adrenal glands. 2.2 cm renal cyst in the midpole region of the right kidney. Smaller less than 1 cm probable cysts in the lower pole of the left kidney. Normal urinary bladder. Stomach/Bowel: Normal appearance of the stomach. Fluid-filled distended small bowel loops measure up to 4 cm with a transitional point in the right mid abdomen. Diffuse left colonic diverticulosis with associated long segment circumferential mucosal wall thickening of the descending and sigmoid colon suggestive of chronic colitis. No significant pericolonic inflammatory changes. Vascular/Lymphatic: Aortic atherosclerosis. No enlarged abdominal or pelvic lymph nodes. Reproductive: Normal sized of the prostate gland. Other: No abdominal wall hernia or abnormality. No abdominopelvic ascites. Musculoskeletal: Spondylosis of the lumbosacral spine. IMPRESSION: 1. Small bowel obstruction with transitional point in the right mid abdomen. 2. Diffuse left colonic diverticulosis with associated long segment  circumferential mucosal wall thickening of the descending and sigmoid colon suggestive of chronic colitis. No significant pericolonic inflammatory changes. 3. 2 few mm hypoattenuated lesions in the liver, too small to be actually characterize by CT. 4. Benign right renal cyst. 5. Moderate emphysema. Aortic Atherosclerosis (ICD10-I70.0) and Emphysema (ICD10-J43.9). Electronically Signed   By: DFidela SalisburyM.D.   On: 11/05/2021 19:24    Microbiology: Results for orders placed or performed during the hospital encounter of 11/05/08  Technologist smear review     Status: None   Collection Time: 11/06/08  6:07 AM  Result Value Ref Range Status   Path Review MORPHOLOGY UNREMARKABLE  Final   Labs: CBC: Recent Labs  Lab 11/05/21 1600 11/06/21 0132 11/06/21 0339 11/07/21 0035  WBC 9.7 16.6* 15.5* 7.8  HGB 15.1 14.8 14.7 11.3*  HCT 44.0 44.1 44.7 34.2*  MCV 95.2 95.7 96.5 96.3  PLT 322 318 313 2326  Basic Metabolic Panel: Recent Labs  Lab 11/05/21 1600 11/06/21 0132 11/06/21 0339 11/07/21 0035  NA 140  --  142 137  K 4.1  --  4.1 3.9  CL 100  --  103 102  CO2 30  --  27 28  GLUCOSE 133*  --  121* 92  BUN 18  --  22 20  CREATININE 1.17 1.44* 1.32* 1.08  CALCIUM 9.9  --  9.7 8.5*  MG 2.2  --  2.3 2.0  PHOS  --   --  3.4  --  Liver Function Tests: Recent Labs  Lab 11/05/21 1600 11/06/21 0339  AST 18 19  ALT 20 22  ALKPHOS 68 68  BILITOT 0.8 1.1  PROT 6.9 6.6  ALBUMIN 4.2 4.0   CBG: No results for input(s): "GLUCAP" in the last 168 hours.  Discharge time spent: greater than 30 minutes.  Signed: Berle Mull, MD [facility] 11/07/2021

## 2021-11-10 LAB — VITAMIN B1: Vitamin B1 (Thiamine): 107.4 nmol/L (ref 66.5–200.0)

## 2021-11-16 ENCOUNTER — Other Ambulatory Visit: Payer: Self-pay

## 2021-11-16 ENCOUNTER — Emergency Department (HOSPITAL_COMMUNITY): Payer: No Typology Code available for payment source

## 2021-11-16 ENCOUNTER — Inpatient Hospital Stay (HOSPITAL_COMMUNITY)
Admission: EM | Admit: 2021-11-16 | Discharge: 2021-11-17 | DRG: 392 | Disposition: A | Discharge status: Home / Self Care | Payer: No Typology Code available for payment source | Attending: Internal Medicine | Admitting: Internal Medicine

## 2021-11-16 ENCOUNTER — Encounter (HOSPITAL_COMMUNITY): Payer: Self-pay | Admitting: *Deleted

## 2021-11-16 DIAGNOSIS — E860 Dehydration: Secondary | ICD-10-CM | POA: Diagnosis present

## 2021-11-16 DIAGNOSIS — J439 Emphysema, unspecified: Secondary | ICD-10-CM | POA: Diagnosis not present

## 2021-11-16 DIAGNOSIS — I2510 Atherosclerotic heart disease of native coronary artery without angina pectoris: Secondary | ICD-10-CM | POA: Diagnosis present

## 2021-11-16 DIAGNOSIS — Z87891 Personal history of nicotine dependence: Secondary | ICD-10-CM | POA: Diagnosis not present

## 2021-11-16 DIAGNOSIS — K5732 Diverticulitis of large intestine without perforation or abscess without bleeding: Principal | ICD-10-CM | POA: Diagnosis present

## 2021-11-16 DIAGNOSIS — E785 Hyperlipidemia, unspecified: Secondary | ICD-10-CM | POA: Diagnosis present

## 2021-11-16 DIAGNOSIS — Z681 Body mass index (BMI) 19 or less, adult: Secondary | ICD-10-CM | POA: Diagnosis not present

## 2021-11-16 DIAGNOSIS — J449 Chronic obstructive pulmonary disease, unspecified: Secondary | ICD-10-CM | POA: Diagnosis present

## 2021-11-16 DIAGNOSIS — I110 Hypertensive heart disease with heart failure: Secondary | ICD-10-CM | POA: Diagnosis present

## 2021-11-16 DIAGNOSIS — E782 Mixed hyperlipidemia: Secondary | ICD-10-CM

## 2021-11-16 DIAGNOSIS — Z809 Family history of malignant neoplasm, unspecified: Secondary | ICD-10-CM

## 2021-11-16 DIAGNOSIS — I5032 Chronic diastolic (congestive) heart failure: Secondary | ICD-10-CM | POA: Diagnosis present

## 2021-11-16 DIAGNOSIS — Z955 Presence of coronary angioplasty implant and graft: Secondary | ICD-10-CM | POA: Diagnosis not present

## 2021-11-16 DIAGNOSIS — E872 Acidosis, unspecified: Secondary | ICD-10-CM | POA: Diagnosis present

## 2021-11-16 DIAGNOSIS — N19 Unspecified kidney failure: Secondary | ICD-10-CM | POA: Diagnosis present

## 2021-11-16 DIAGNOSIS — Z7902 Long term (current) use of antithrombotics/antiplatelets: Secondary | ICD-10-CM | POA: Diagnosis not present

## 2021-11-16 DIAGNOSIS — K5792 Diverticulitis of intestine, part unspecified, without perforation or abscess without bleeding: Secondary | ICD-10-CM | POA: Diagnosis present

## 2021-11-16 DIAGNOSIS — R1900 Intra-abdominal and pelvic swelling, mass and lump, unspecified site: Secondary | ICD-10-CM | POA: Diagnosis present

## 2021-11-16 DIAGNOSIS — D494 Neoplasm of unspecified behavior of bladder: Secondary | ICD-10-CM | POA: Diagnosis present

## 2021-11-16 DIAGNOSIS — I252 Old myocardial infarction: Secondary | ICD-10-CM | POA: Diagnosis not present

## 2021-11-16 DIAGNOSIS — K219 Gastro-esophageal reflux disease without esophagitis: Secondary | ICD-10-CM | POA: Diagnosis present

## 2021-11-16 DIAGNOSIS — R634 Abnormal weight loss: Secondary | ICD-10-CM | POA: Diagnosis present

## 2021-11-16 DIAGNOSIS — Z79899 Other long term (current) drug therapy: Secondary | ICD-10-CM | POA: Diagnosis not present

## 2021-11-16 DIAGNOSIS — Z7951 Long term (current) use of inhaled steroids: Secondary | ICD-10-CM

## 2021-11-16 DIAGNOSIS — I10 Essential (primary) hypertension: Secondary | ICD-10-CM | POA: Diagnosis not present

## 2021-11-16 DIAGNOSIS — N3289 Other specified disorders of bladder: Secondary | ICD-10-CM

## 2021-11-16 HISTORY — DX: Unspecified intestinal obstruction, unspecified as to partial versus complete obstruction: K56.609

## 2021-11-16 LAB — COMPREHENSIVE METABOLIC PANEL
ALT: 22 U/L (ref 0–44)
AST: 19 U/L (ref 15–41)
Albumin: 3.9 g/dL (ref 3.5–5.0)
Alkaline Phosphatase: 68 U/L (ref 38–126)
Anion gap: 7 (ref 5–15)
BUN: 32 mg/dL — ABNORMAL HIGH (ref 8–23)
CO2: 30 mmol/L (ref 22–32)
Calcium: 9.6 mg/dL (ref 8.9–10.3)
Chloride: 100 mmol/L (ref 98–111)
Creatinine, Ser: 1.18 mg/dL (ref 0.61–1.24)
GFR, Estimated: >60 mL/min (ref >60)
Glucose, Bld: 135 mg/dL — ABNORMAL HIGH (ref 70–99)
Potassium: 3.5 mmol/L (ref 3.5–5.1)
Sodium: 137 mmol/L (ref 135–145)
Total Bilirubin: 0.8 mg/dL (ref 0.3–1.2)
Total Protein: 6.9 g/dL (ref 6.5–8.1)

## 2021-11-16 LAB — CBC WITH DIFFERENTIAL/PLATELET
Abs Immature Granulocytes: 0.03 10*3/uL (ref 0.00–0.07)
Basophils Absolute: 0.1 10*3/uL (ref 0.0–0.1)
Basophils Relative: 1 %
Eosinophils Absolute: 0.2 10*3/uL (ref 0.0–0.5)
Eosinophils Relative: 2 %
HCT: 41.2 % (ref 39.0–52.0)
Hemoglobin: 13.6 g/dL (ref 13.0–17.0)
Immature Granulocytes: 0 %
Lymphocytes Relative: 16 %
Lymphs Abs: 1.5 10*3/uL (ref 0.7–4.0)
MCH: 31.9 pg (ref 26.0–34.0)
MCHC: 33 g/dL (ref 30.0–36.0)
MCV: 96.5 fL (ref 80.0–100.0)
Monocytes Absolute: 1 10*3/uL (ref 0.1–1.0)
Monocytes Relative: 11 %
Neutro Abs: 6.4 10*3/uL (ref 1.7–7.7)
Neutrophils Relative %: 70 %
Platelets: 288 10*3/uL (ref 150–400)
RBC: 4.27 MIL/uL (ref 4.22–5.81)
RDW: 12.8 % (ref 11.5–15.5)
WBC: 9.2 10*3/uL (ref 4.0–10.5)
nRBC: 0 % (ref 0.0–0.2)

## 2021-11-16 LAB — URINALYSIS, ROUTINE W REFLEX MICROSCOPIC
Bilirubin Urine: NEGATIVE
Glucose, UA: NEGATIVE mg/dL
Hgb urine dipstick: NEGATIVE
Ketones, ur: NEGATIVE mg/dL
Leukocytes,Ua: NEGATIVE
Nitrite: NEGATIVE
Protein, ur: NEGATIVE mg/dL
Specific Gravity, Urine: 1.016 (ref 1.005–1.030)
pH: 6 (ref 5.0–8.0)

## 2021-11-16 LAB — LIPASE, BLOOD: Lipase: 27 U/L (ref 11–51)

## 2021-11-16 LAB — LACTIC ACID, PLASMA
Lactic Acid, Venous: 2.5 mmol/L (ref 0.5–1.9)
Lactic Acid, Venous: 1.7 mmol/L (ref 0.5–1.9)

## 2021-11-16 LAB — MAGNESIUM: Magnesium: 2.3 mg/dL (ref 1.7–2.4)

## 2021-11-16 LAB — SALICYLATE LEVEL: Salicylate Lvl: <7 mg/dL — ABNORMAL LOW (ref 7.0–30.0)

## 2021-11-16 MED ORDER — SODIUM CHLORIDE 0.9 % IV SOLN: 2.0000 g | INTRAVENOUS | Status: DC

## 2021-11-16 MED ORDER — FLUTICASONE FUROATE-VILANTEROL 200-25 MCG/ACT IN AEPB
1.0000 | INHALATION_SPRAY | Freq: Every day | RESPIRATORY_TRACT | Status: DC
  Filled 2021-11-16: qty 28

## 2021-11-16 MED ORDER — LACTATED RINGERS IV SOLN: INTRAVENOUS | Status: AC

## 2021-11-16 MED ORDER — SODIUM CHLORIDE 0.9 % IV SOLN
2.0000 g | Freq: Once | INTRAVENOUS | Status: AC
  Administered 2021-11-16: 2 g via INTRAVENOUS
  Filled 2021-11-16: qty 20

## 2021-11-16 MED ORDER — ACETAMINOPHEN 650 MG RE SUPP: 650.0000 mg | Freq: Four times a day (QID) | RECTAL | Status: DC | PRN

## 2021-11-16 MED ORDER — NALOXONE HCL 0.4 MG/ML IJ SOLN: 0.4000 mg | INTRAMUSCULAR | Status: DC | PRN

## 2021-11-16 MED ORDER — HYDROMORPHONE HCL 1 MG/ML IJ SOLN: 0.5000 mg | INTRAMUSCULAR | Status: DC | PRN

## 2021-11-16 MED ORDER — ATORVASTATIN CALCIUM 40 MG PO TABS: 40.0000 mg | ORAL_TABLET | Freq: Every day | ORAL | Status: DC

## 2021-11-16 MED ORDER — ONDANSETRON HCL 4 MG/2ML IJ SOLN: 4.0000 mg | Freq: Four times a day (QID) | INTRAMUSCULAR | Status: DC | PRN

## 2021-11-16 MED ORDER — IOHEXOL 300 MG/ML  SOLN
100.0000 mL | Freq: Once | INTRAMUSCULAR | Status: AC | PRN
  Administered 2021-11-16: 100 mL via INTRAVENOUS

## 2021-11-16 MED ORDER — METRONIDAZOLE 500 MG/100ML IV SOLN
500.0000 mg | Freq: Two times a day (BID) | INTRAVENOUS | Status: DC
  Administered 2021-11-17: 500 mg via INTRAVENOUS
  Filled 2021-11-16: qty 100

## 2021-11-16 MED ORDER — UMECLIDINIUM BROMIDE 62.5 MCG/ACT IN AEPB
1.0000 | INHALATION_SPRAY | Freq: Every day | RESPIRATORY_TRACT | Status: DC
  Filled 2021-11-16: qty 7

## 2021-11-16 MED ORDER — TIOTROPIUM BROMIDE MONOHYDRATE 2.5 MCG/ACT IN AERS: 2.0000 | INHALATION_SPRAY | Freq: Every day | RESPIRATORY_TRACT | Status: DC

## 2021-11-16 MED ORDER — METRONIDAZOLE 500 MG/100ML IV SOLN
500.0000 mg | Freq: Once | INTRAVENOUS | Status: AC
  Administered 2021-11-16: 500 mg via INTRAVENOUS
  Filled 2021-11-16: qty 100

## 2021-11-16 MED ORDER — PANTOPRAZOLE SODIUM 40 MG IV SOLR
40.0000 mg | INTRAVENOUS | Status: DC
  Administered 2021-11-16: 40 mg via INTRAVENOUS
  Filled 2021-11-16: qty 10

## 2021-11-16 MED ORDER — IOHEXOL 9 MG/ML PO SOLN: 1000.0000 mL | ORAL | Status: DC

## 2021-11-16 MED ORDER — IOHEXOL 9 MG/ML PO SOLN
ORAL | Status: AC
  Filled 2021-11-16: qty 1000

## 2021-11-16 MED ORDER — ALBUTEROL SULFATE (2.5 MG/3ML) 0.083% IN NEBU: 2.5000 mg | INHALATION_SOLUTION | RESPIRATORY_TRACT | Status: DC | PRN

## 2021-11-16 MED ORDER — SODIUM CHLORIDE 0.9 % IV BOLUS
500.0000 mL | Freq: Once | INTRAVENOUS | Status: AC
  Administered 2021-11-16: 500 mL via INTRAVENOUS

## 2021-11-16 MED ORDER — ACETAMINOPHEN 325 MG PO TABS: 650.0000 mg | ORAL_TABLET | Freq: Four times a day (QID) | ORAL | Status: DC | PRN

## 2021-11-16 NOTE — H&P (Addendum)
History and Physical    PLEASE NOTE THAT DRAGON DICTATION SOFTWARE WAS USED IN THE CONSTRUCTION OF THIS NOTE.   Austin [name] NUU:[id number] DOB: May 03, 1940 DOA: 11/16/2021  PCP: [facility] [place]  Patient coming from: home   I have personally briefly reviewed patient's old medical records in [place]  Chief Complaint: abdominal pain  HPI: Austin [name] is a 81 y.o. male with medical history significant for chronic diastolic heart failure, COPD, GERD, hypertension, hyperlipidemia, who is admitted to [facility] on 11/16/2021 with acute diverticulitis after presenting from home to [facility] ED complaining of abdominal pain.   The patient was recently hospitalized at [facility] from 11/05/21 to 11/07/21 for small bowel obstruction, which resolved following enteroscopy with Dr. Michail [name] of Gastroenterology on 11/07/2021 following which the patient was discharged to home on that day.  The patient reports that for the first 3 to 4 days following discharge to home that he remained without abdominal discomfort, before developing new onset sharp, nonradiating, left-sided abdominal discomfort with progressive intensity over the course of the last week.  He notes that this has been exacerbated with palpation of the left portion of his abdomen.  No interval trauma.  Has continued to have bowel movements, with most recent bowel movement occurring earlier today.  Continues to produce flatus.  Not associate the recent melena or hematochezia.  He notes stool has been slightly more loose than baseline.  Denies any recent travel.  No associated any subjective fever, chills, rigors, or generalized myalgias.  However, he does note that this new onset abdominal discomfort has been associated with intermittent nausea resulting in 1-2 episodes per day of nonbloody, nonbilious emesis over that timeframe, most recent such episode occurring earlier today.  Denies any associated chest pain, shortness of breath,  palpitations, diaphoresis, dizziness, presyncope, or syncope.  No new rash, dysuria, gross hematuria.  Denies any known personal history of underlying malignancy, although he reports unintentional weight loss of approximately 40 pounds over the last few months.  Patient estimates that his most recent colonoscopy occurred approximately 5 years ago.  Family history notable for cervical cancer in his mother.  Medical history notable for chronic diastolic heart failure, with most recent echocardiogram occurring in October 2014, which is notable for LVEF 50 to 55%, normal left ventricular cavity size, normal left ventricular wall thickness, no evidence of focal wall motion rallies, also demonstrating evidence of grade 1 diastolic dysfunction.  Not on a scheduled diuretic medications at home.     ED Course:  Vital signs in the ED were notable for the following: Afebrile; heart rate 34-74; systolic blood pressures in the 130s to 150s; respiratory rate 16, oxygen saturation 97 to 100% on room air.  Labs were notable for the following: CMP notable for the following: Sodium 137, Austin [name] 3.5, bicarbonate 30, BUN 32 compared to 20 on 10/18/2021, creatinine 1.18 compared to 1.08 on 11/07/2021, glucose 135, liver enzymes within normal limits.  Lipase 27.  Initial lactate 2.5, with repeat value trending down to 1.7.  CBC notable for white blood cell count 9 200 compared to 7800 on 11/07/2021, hemoglobin 13.6 associated with normocytic/recurrent findings as well as nonelevated RDW, relative to most recent prior hemoglobin value of 11.3 on 11/07/2021.  Urinalysis ordered, with result currently pending.  Imaging and additional notable ED work-up: Chest x-ray showed no evidence of acute cardiopulmonary process, including evidence of infiltrate, edema, effusion, or pneumothorax.  Plain films of the abdomen showed evidence of acute  process, including no evidence of bowel obstruction nor any evidence of pneumoperitoneum.  CT  abdomen/pelvis with contrast, which was compared to CT abdomen specialist with contrast on 11/07/2021, she reviewed heterogeneous soft tissue mass extending from the posterior aspect of the prostate gland into the anterior aspect of the distal sigmoid colon/rectum which was not clearly identified on most recent prior CT abdomen/pelvis;  today's CT abdomen/will's also showed evidence of colonic diverticulosis with findings suggestive of diverticulitis involving the mid to distal sigmoid colon, without evidence of obstruction, abscess, or perforation;  ; CT abdomen/pelvis also showed a small area of increased attenuation within the dependent portion of the bladder  While in the ED, the following were administered: Rocephin, Flagyl, normal saline dose of 500 cc bolus.  Subsequently, the patient was admitted for further evaluation and management of acute diverticulitis presenting with 1 week of near left-sided abdominal pain associated nausea/vomiting, with CT abdomen/pelvis suggestive of new intra-abdominal mass, and presenting labs notable for lactic acidosis, which is resolved with interval IV fluids, as well as evidence of acute prerenal azotemia.    Review of Systems: As per HPI otherwise 10 point review of systems negative.   Past Medical History:  Diagnosis Date   CAD (coronary artery disease)    COPD (chronic obstructive pulmonary disease) (HCC)    Dyspnea on exertion    GERD (gastroesophageal reflux disease)    Hyperlipidemia    MI (myocardial infarction) ([place])    Rectal bleeding    SBO (small bowel obstruction) ([place])    Sept '23   Tobacco abuse    Unspecified essential hypertension     Past Surgical History:  Procedure Laterality Date   CORONARY ANGIOPLASTY WITH STENT PLACEMENT     Drug eluting stent placed to the first obtuse marginal with a 2.58 mm cypher drug eluting stent. Circumflex was treated with 3.0 x 18 mm TAXUS drug-eluting stent on 07/17/03   ENTEROSCOPY N/A 11/07/2021    Procedure: ENTEROSCOPY;  Surgeon: Austin Corner, MD;  Location: [place];  Service: Gastroenterology;  Laterality: N/A;   EYE SURGERY  1971   bilateral   PTCA     And drug-eluting stent to the RCA   RECTAL SURGERY  2008   dr. Rosana Hoes did this surgery    Social History:  reports that he has quit smoking. His smoking use included cigarettes. He smoked an average of 1 pack per day. He has never used smokeless tobacco. He reports current alcohol use. He reports that he does not use drugs.   No Known Allergies  Family History  Problem Relation Age of Onset   Cancer Mother        cervical   Lung disease Father        Black lung    Family history reviewed and not pertinent    Prior to Admission medications   Medication Sig Start Date End Date Taking? Authorizing Provider  atorvastatin (LIPITOR) 40 MG tablet Take 1 tablet (40 mg total) by mouth at bedtime. 12/10/20   Dunn, Nedra Hai, PA-C  clopidogrel (PLAVIX) 75 MG tablet Take 1 tablet (75 mg total) by mouth daily. 12/10/20   Dunn, Nedra Hai, PA-C  famotidine (PEPCID) 40 MG tablet Take 1 tablet (40 mg total) by mouth daily. 10/04/21   Piontek, Junie Panning, MD  guaiFENesin (MUCINEX) 600 MG 12 hr tablet Take 600 mg by mouth 2 (two) times daily as needed for to loosen phlegm.    [provider]  losartan (COZAAR) 25 MG  tablet Take 1 tablet (25 mg total) by mouth daily. 12/10/20   Dunn, Nedra Hai, PA-C  metoprolol succinate (TOPROL-XL) 25 MG 24 hr tablet Take 1 tablet (25 mg total) by mouth daily. 11/07/21   Lavina Hamman, MD  Multiple Vitamins-Minerals (MULTIVITAMIN WITH MINERALS) tablet Take 1 tablet by mouth daily.    [provider]  nitroGLYCERIN (NITROSTAT) 0.4 MG SL tablet Place 1 tablet (0.4 mg total) under the tongue as directed. Patient taking differently: Place 0.4 mg under the tongue every 5 (five) minutes as needed for chest pain. 11/10/19   Burnell Blanks, MD  PROAIR HFA 108 [phone] Base) MCG/ACT inhaler Inhale  1 puff into the lungs every 4 (four) hours as needed for shortness of breath. 01/22/16   [provider]  SPIRIVA RESPIMAT 2.5 MCG/ACT AERS Inhale 2 puffs into the lungs daily. 12/23/18   [provider]  SYMBICORT 160-4.5 MCG/ACT inhaler Inhale 2 puffs into the lungs daily. 12/23/18   [provider]  triamcinolone cream (KENALOG) 0.1 % Apply 1 Application topically 2 (two) times daily. 11/04/20   [provider]     Objective    Physical Exam: Vitals:   11/16/21 1426 11/16/21 1745 11/16/21 1800 11/16/21 1900  BP: 131/69 (!) 154/70 (!) 148/82 (!) 154/80  Pulse: 82 65 65 64  Resp: 16 16 16 16   Temp: 98.5 F (36.9 C)  98 F (36.7 C)   TempSrc: Oral  Oral   SpO2: 100% 98% 100% 97%  Weight:      Height:        General: appears to be stated age; alert, oriented Skin: warm, dry, no rash Head:  AT/[place] Mouth:  Oral mucosa membranes appear dry, normal dentition Neck: supple; trachea midline Heart:  RRR; did not appreciate any M/R/G Lungs: CTAB, did not appreciate any wheezes, rales, or rhonchi Abdomen: + BS; soft, ND, tenderness to palpation noted over the left upper quadrant and left lower quadrant, in the absence of any associated guarding, rigidity, or rebound tenderness Vascular: 2+ pedal pulses b/l; 2+ radial pulses b/l Extremities: no peripheral edema, no muscle wasting Neuro: strength and sensation intact in upper and lower extremities b/l    Labs on Admission: I have personally reviewed following labs and imaging studies  CBC: Recent Labs  Lab 11/16/21 1514  WBC 9.2  NEUTROABS 6.4  HGB 13.6  HCT 41.2  MCV 96.5  PLT 370   Basic Metabolic Panel: Recent Labs  Lab 11/16/21 1514  NA 137  K 3.5  CL 100  CO2 30  GLUCOSE 135*  BUN 32*  CREATININE 1.18  CALCIUM 9.6  MG 2.3   GFR: Estimated Creatinine Clearance: 40.4 mL/min (by C-G formula based on SCr of 1.18 mg/dL). Liver Function Tests: Recent Labs  Lab 11/16/21 1514  AST  19  ALT 22  ALKPHOS 68  BILITOT 0.8  PROT 6.9  ALBUMIN 3.9   Recent Labs  Lab 11/16/21 1514  LIPASE 27   No results for input(s): "AMMONIA" in the last 168 hours. Coagulation Profile: No results for input(s): "INR", "PROTIME" in the last 168 hours. Cardiac Enzymes: No results for input(s): "CKTOTAL", "CKMB", "CKMBINDEX", "TROPONINI" in the last 168 hours. BNP (last 3 results) No results for input(s): "PROBNP" in the last 8760 hours. HbA1C: No results for input(s): "HGBA1C" in the last 72 hours. CBG: No results for input(s): "GLUCAP" in the last 168 hours. Lipid Profile: No results for input(s): "CHOL", "HDL", "LDLCALC", "TRIG", "CHOLHDL", "LDLDIRECT" in  the last 72 hours. Thyroid Function Tests: No results for input(s): "TSH", "T4TOTAL", "FREET4", "T3FREE", "THYROIDAB" in the last 72 hours. Anemia Panel: No results for input(s): "VITAMINB12", "FOLATE", "FERRITIN", "TIBC", "IRON", "RETICCTPCT" in the last 72 hours. Urine analysis:    Component Value Date/Time   COLORURINE AMBER (A) 11/05/2021 1612   APPEARANCEUR CLOUDY (A) 11/05/2021 1612   LABSPEC 1.020 11/05/2021 1612   PHURINE 7.0 11/05/2021 1612   GLUCOSEU NEGATIVE 11/05/2021 1612   HGBUR NEGATIVE 11/05/2021 1612   BILIRUBINUR NEGATIVE 11/05/2021 1612   KETONESUR 5 (A) 11/05/2021 1612   PROTEINUR NEGATIVE 11/05/2021 1612   UROBILINOGEN 1.0 11/05/2008 1811   NITRITE NEGATIVE 11/05/2021 1612   LEUKOCYTESUR NEGATIVE 11/05/2021 1612    Radiological Exams on Admission: CT ABDOMEN PELVIS W CONTRAST  Result Date: 11/16/2021 CLINICAL DATA:  Nausea, vomiting and abdominal pain. EXAM: CT ABDOMEN AND PELVIS WITH CONTRAST TECHNIQUE: Multidetector CT imaging of the abdomen and pelvis was performed using the standard protocol following bolus administration of intravenous contrast. RADIATION DOSE REDUCTION: This exam was performed according to the departmental dose-optimization program which includes automated exposure control,  adjustment of the mA and/or kV according to patient size and/or use of iterative reconstruction technique. CONTRAST:  113m OMNIPAQUE IOHEXOL 300 MG/ML  SOLN COMPARISON:  November 07, 2021 FINDINGS: Lower chest: Emphysematous lung disease is seen within the bilateral lung bases. Hepatobiliary: 6 mm and 11 mm foci of parenchymal low attenuation are seen within the right lobe of the liver. No gallstones, gallbladder wall thickening, or biliary dilatation. Pancreas: Unremarkable. No pancreatic ductal dilatation or surrounding inflammatory changes. Spleen: Normal in size without focal abnormality. Adrenals/Urinary Tract: Adrenal glands are unremarkable. Kidneys are normal in size, without renal calculi or hydronephrosis. Bilateral simple renal cysts are seen. The largest is seen within the posterior aspect of the mid right kidney and measures approximately 2.8 cm in diameter. An 11 mm x 18 mm area of mildly increased attenuation (approximately 51.28 Hounsfield units) is seen within the lateral aspect of the dependent portion of the urinary bladder on the right (axial CT image 60, CT series 2). This is not clearly identified on the prior study. Stomach/Bowel: Stomach is within normal limits. Appendix appears normal. No evidence of bowel wall thickening, distention, or inflammatory changes. Numerous diverticula are seen throughout the large bowel. Mild thickening of the mid and distal sigmoid colon is also seen. Vascular/Lymphatic: Aortic atherosclerosis. No enlarged abdominal or pelvic lymph nodes. Reproductive: The prostate gland is mildly enlarged, with a 3.2 cm x 3.3 cm x 2.0 cm heterogeneous soft tissue mass seen extending from the posterior aspect of the prostate gland into the anterior aspect of the distal sigmoid colon and rectum (axial CT image 69, CT series 2). This is not clearly identified on the prior study. Other: No abdominal wall hernia or abnormality. No abdominopelvic ascites. Musculoskeletal:  Multilevel degenerative changes are seen throughout the lumbar spine. IMPRESSION: 1. Heterogeneous soft tissue mass extending from the posterior aspect of the prostate gland into the anterior aspect of the distal sigmoid colon and rectum. This is not clearly identified on the prior study and is concerning for the presence of an underlying neoplastic process. Correlation with PSA values and physical examination is recommended. 2. Colonic diverticulosis with additional findings suggestive of colitis/diverticulitis involving the mid to distal sigmoid colon. 3. Bilateral simple renal cysts (Bosniak 1). No additional follow-up imaging is recommended. This recommendation follows ACR consensus guidelines: Management of the Incidental Renal Mass on CT: A White Paper of  the ACR Incidental Findings Committee. J Am Coll Radiol [phone]. 4. Small area of increased attenuation within the dependent portion of the bladder, as described above, which may represent a small bladder mass. Further evaluation with cystography is recommended. 5. Aortic atherosclerosis. Aortic Atherosclerosis (ICD10-I70.0) and Emphysema (ICD10-J43.9). Electronically Signed   By: Virgina Norfolk M.D.   On: 11/16/2021 18:36   DG ABD ACUTE 2+V W 1V CHEST  Result Date: 11/16/2021 CLINICAL DATA:  Nausea, vomiting, abdominal pain EXAM: DG ABDOMEN ACUTE WITH 1 VIEW CHEST COMPARISON:  Previous studies including the CT enterography done on 11/07/2021 FINDINGS: Cardiac size is within normal limits. Possible coronary artery calcifications/stent are noted. Flattening of diaphragms and decreased bronchovascular markings in both lungs suggest severe COPD. There are small linear densities in both apices, possibly suggesting scarring. There is no focal pulmonary consolidation or signs of pulmonary edema. There is no pleural effusion or pneumothorax. Bowel gas pattern is nonspecific. Small amount of stool is seen in colon. No abnormal masses or calcifications  are seen. Degenerative changes are noted in lumbar spine. IMPRESSION: There is no evidence of intestinal obstruction or pneumoperitoneum. No abnormal masses or calcifications are seen. Lumbar spondylosis. Severe COPD. Electronically Signed   By: Elmer Picker M.D.   On: 11/16/2021 15:17      Assessment/Plan    Principal Problem:   Acute diverticulitis Active Problems:   HLD (hyperlipidemia)   Dehydration   Essential hypertension   COPD (chronic obstructive pulmonary disease) (HCC)   Intraabdominal mass   Lactic acidosis   Acute prerenal azotemia   Chronic diastolic CHF (congestive heart failure) (HCC)      #) Acute diverticulitis: Diagnosis invasive 1 week of progressive left lower quadrant abdominal discomfort with CT abdomen/pelvis showing interval development of evidence of acute diverticulitis involving the mid to distal sigmoid colon, in the absence of any evidence of obstruction, perforation, or abscess, and interval increase in white blood cell count, as further quantified above.  Notable in the setting interval finding of intra-abdominal mass, warranting further evaluation, as further detailed below.  Of note, no SIRS criteria met at this time, therefore criteria for sepsis not currently met.  Started on Rocephin and IV Flagyl in the emergency department seen, which will be continued.  Plan: Lactated Ringer's at 75 cc/h x 12 hours.  Continue Rocephin and IV Flagyl, as above.  Repeat CBC with differential in the morning.  Clear liquid diet.  Further evaluation management of newly identified intra-abdominal mass, as further detailed below.  Check INR.  Prn IV Dilaudid.  Prn Zofran.  Repeat CMP in the morning.        #) New intra-abdominal mass: Today CT abdomen/pelvis with contrast showed evidence of a soft tissue mass extending from the posterior aspect of the prostate gland into the anterior aspect of the distal sigmoid colon/rectum, which was noted to not be clearly  identified on most recent prior CT abdomen/pelvis, which was performed with contrast and completed on 11/07/2021.  Notable in the setting of recent unintentional weight loss, as above.  No known history of underlying malignancy.  Plan: Further assess for timing of most recent colonoscopy. Consider discussion with onc.  Repeat CMP and CBC in the morning.           #) Lactic acidosis: Initial lactate elevated at 2.5 with repeat value trending down to 1.7 following interval IV fluids, as above.  Suspect that this is multifactorial in etiology, with contribution from dehydration in setting of recent increase  in GI losses, with differential also including the possibility of contribution from potential malignancy given newly identified intra-abdominal mass, as further detailed above.  Outpatient does appear to present with acute infectious process in the form of acute diverticulitis, SIRS criteria not met for sepsis at this time.  Urinalysis currently pending.  Plan: Continuous IV fluids, as above.  Monitor strict I's and O's and daily weights.  Follow-up result urinalysis.  Further evaluation dehydration, as further detailed below.  Check INR, and add on salicylate level.  Repeat CMP/CBC in the morning.             #) Dehydration: Clinical suspicion for such, including the appearance of dry oral mucous membranes as well as laboratory findings notable for acute prerenal azotemia. Appears to be in the setting of   recent increase in GI losses in the form of nausea/vomiting accompanied with decline in oral intake.  No e/o associated hypotension.   Plan: Monitor strict I's and O's.  Daily weights.  Repeat CMP in the morning. IVF's, as above.  Follow-up result urinalysis.  Prn IV Zofran.  Add on serum magnesium level.             #) Chronic diastolic heart failure: documented history of such, with most recent echocardiogram performed occurring in October 2014, notable for LVEF 55%, as  well as grade 1 diastolic dysfunction, with additional details as conveyed above. No clinical evidence to suggest acutely decompensated heart failure at this time.  Not on a scheduled diuretic medications as an outpatient.   Plan: monitor strict I's & O's and daily weights. Repeat CMP in AM. Check serum mag level.           #) COPD: Documented history as such, the setting of being a former smoker.  No clinical evidence to suggest acute exacerbation at this time.    Plan: Continue Symbicort, Spiriva.  Prn albuterol nebulizer ordered.            #) Hyperlipidemia: documented h/o such. On high intensity atorvastatin as outpatient.   Plan: continue home statin.              #) Essential Hypertension: documented h/o such, with outpatient antihypertensive regimen including losartan, metoprolol succinate.  SBP's in the ED today: 130s to 150s mmHg. however, in setting of presenting dehydration as well as evidence of acute infection in the form of acute diverticulitis, will hold him antihypertensive medications for now.  Plan: Close monitoring of subsequent BP via routine VS. hold home losartan and metoprolol succinate for now, as above.      DVT prophylaxis: SCD's   Code Status: Full code Family Communication: none Disposition Plan: Per Rounding Team Consults called: none;  Admission status: Inpatient   PLEASE NOTE THAT DRAGON DICTATION SOFTWARE WAS USED IN THE CONSTRUCTION OF THIS NOTE.   Austin [name]   11/16/2021, 8:19 PM

## 2021-11-16 NOTE — ED Provider Triage Note (Signed)
Emergency Medicine Provider Triage Evaluation Note  Austin [name] , a 81 y.o. male  was evaluated in triage.  Pt complains of n/v/d and abd pain. Recent admission for the same. Called Dr. Michail Sermon today and was told to come to the ED for further eval   Review of Systems  Positive: Abd distension/ pain  Negative: fever  Physical Exam  Ht '5\' 10"'$  (1.778 m)   Wt 57.2 kg   BMI 18.08 kg/m  Gen:   Awake, no distress   Resp:  Normal effort  MSK:   Moves extremities without difficulty  Other:  Ttp abd  Medical Decision Making  Medically screening exam initiated at 2:27 PM.  Appropriate orders placed.  [name] was informed that the remainder of the evaluation will be completed by another provider, this initial triage assessment does not replace that evaluation, and the importance of remaining in the ED until their evaluation is complete.  Work up initiated   [employer], PA-C 11/16/21 1429

## 2021-11-16 NOTE — ED Provider Notes (Signed)
[facility] DEPT Provider Note   CSN: [id number] Arrival date & time: 11/16/21  1412     History  Chief Complaint  Patient presents with   R/o SBO    NICOLA QUESNELL is a 81 y.o. male.  The history is provided by the patient and medical records. No language interpreter was used.  Abdominal Pain Pain location:  Generalized Pain quality: aching and sharp   Pain quality comment:  Twisting Pain radiates to:  Does not radiate Pain severity:  Moderate Onset quality:  Gradual Duration:  4 days Timing:  Intermittent Progression:  Waxing and waning Chronicity:  Recurrent Relieved by:  Nothing Worsened by:  Nothing Associated symptoms: diarrhea, fatigue, nausea and vomiting   Associated symptoms: no chest pain, no chills, no constipation, no cough, no dysuria, no fever, no melena and no shortness of breath        Home Medications Prior to Admission medications   Medication Sig Start Date End Date Taking? Authorizing Provider  atorvastatin (LIPITOR) 40 MG tablet Take 1 tablet (40 mg total) by mouth at bedtime. 12/10/20   Dunn, Nedra Hai, PA-C  clopidogrel (PLAVIX) 75 MG tablet Take 1 tablet (75 mg total) by mouth daily. 12/10/20   Dunn, Nedra Hai, PA-C  famotidine (PEPCID) 40 MG tablet Take 1 tablet (40 mg total) by mouth daily. 10/04/21   Piontek, Junie Panning, MD  guaiFENesin (MUCINEX) 600 MG 12 hr tablet Take 600 mg by mouth 2 (two) times daily as needed for to loosen phlegm.    [provider]  losartan (COZAAR) 25 MG tablet Take 1 tablet (25 mg total) by mouth daily. 12/10/20   Dunn, Nedra Hai, PA-C  metoprolol succinate (TOPROL-XL) 25 MG 24 hr tablet Take 1 tablet (25 mg total) by mouth daily. 11/07/21   Lavina Hamman, MD  Multiple Vitamins-Minerals (MULTIVITAMIN WITH MINERALS) tablet Take 1 tablet by mouth daily.    [provider]  nitroGLYCERIN (NITROSTAT) 0.4 MG SL tablet Place 1 tablet (0.4 mg total) under the tongue as  directed. Patient taking differently: Place 0.4 mg under the tongue every 5 (five) minutes as needed for chest pain. 11/10/19   Burnell Blanks, MD  PROAIR HFA 108 [phone] Base) MCG/ACT inhaler Inhale 1 puff into the lungs every 4 (four) hours as needed for shortness of breath. 01/22/16   [provider]  SPIRIVA RESPIMAT 2.5 MCG/ACT AERS Inhale 2 puffs into the lungs daily. 12/23/18   [provider]  SYMBICORT 160-4.5 MCG/ACT inhaler Inhale 2 puffs into the lungs daily. 12/23/18   [provider]  triamcinolone cream (KENALOG) 0.1 % Apply 1 Application topically 2 (two) times daily. 11/04/20   [provider]      Allergies    Patient has no known allergies.    Review of Systems   Review of Systems  Constitutional:  Positive for fatigue. Negative for chills, diaphoresis and fever.  HENT:  Negative for congestion.   Respiratory:  Negative for cough, chest tightness, shortness of breath and wheezing.   Cardiovascular:  Negative for chest pain, palpitations and leg swelling.  Gastrointestinal:  Positive for abdominal distention (intermittentlyu), abdominal pain, diarrhea, nausea and vomiting. Negative for blood in stool, constipation, melena and rectal pain.  Genitourinary:  Negative for dysuria, flank pain and frequency.  Musculoskeletal:  Negative for back pain and neck pain.  Skin:  Negative for rash and wound.  Neurological:  Negative for dizziness, light-headedness and headaches.  Psychiatric/Behavioral:  Negative for  agitation and confusion.     Physical Exam Updated Vital Signs BP 131/69 (BP Location: Left Arm)   Pulse 82   Temp 98.5 F (36.9 C) (Oral)   Resp 16   Ht '5\' 10"'$  (1.778 m)   Wt 57.2 kg   SpO2 100%   BMI 18.08 kg/m  Physical Exam Vitals and nursing note reviewed.  Constitutional:      General: He is not in acute distress.    Appearance: He is well-developed. He is not ill-appearing, toxic-appearing or diaphoretic.  HENT:      Head: Normocephalic and atraumatic.     Nose: Nose normal. No congestion or rhinorrhea.     Mouth/Throat:     Mouth: Mucous membranes are moist.     Pharynx: No oropharyngeal exudate or posterior oropharyngeal erythema.  Eyes:     Extraocular Movements: Extraocular movements intact.     Conjunctiva/sclera: Conjunctivae normal.     Pupils: Pupils are equal, round, and reactive to light.  Cardiovascular:     Rate and Rhythm: Normal rate and regular rhythm.     Heart sounds: No murmur heard. Pulmonary:     Effort: Pulmonary effort is normal. No respiratory distress.     Breath sounds: Normal breath sounds. No wheezing, rhonchi or rales.  Chest:     Chest wall: No tenderness.  Abdominal:     General: Abdomen is flat.     Palpations: Abdomen is soft.     Tenderness: There is no abdominal tenderness (pain but not tender). There is no right CVA tenderness, left CVA tenderness, guarding or rebound.  Musculoskeletal:        General: No swelling or tenderness.     Cervical back: Neck supple.  Skin:    General: Skin is warm and dry.     Capillary Refill: Capillary refill takes less than 2 seconds.     Findings: No erythema.  Neurological:     General: No focal deficit present.     Mental Status: He is alert.  Psychiatric:        Mood and Affect: Mood normal.     ED Results / Procedures / Treatments   Labs (all labs ordered are listed, but only abnormal results are displayed) Labs Reviewed  COMPREHENSIVE METABOLIC PANEL - Abnormal; Notable for the following components:      Result Value   Glucose, Bld 135 (*)    BUN 32 (*)    All other components within normal limits  LACTIC ACID, PLASMA - Abnormal; Notable for the following components:   Lactic Acid, Venous 2.5 (*)    All other components within normal limits  SALICYLATE LEVEL - Abnormal; Notable for the following components:   Salicylate Lvl <0.9 (*)    All other components within normal limits  CBC WITH  DIFFERENTIAL/PLATELET  LIPASE, BLOOD  URINALYSIS, ROUTINE W REFLEX MICROSCOPIC  LACTIC ACID, PLASMA  MAGNESIUM  CBC WITH DIFFERENTIAL/PLATELET  COMPREHENSIVE METABOLIC PANEL  MAGNESIUM  PROTIME-INR    EKG None  Radiology CT ABDOMEN PELVIS W CONTRAST  Result Date: 11/16/2021 CLINICAL DATA:  Nausea, vomiting and abdominal pain. EXAM: CT ABDOMEN AND PELVIS WITH CONTRAST TECHNIQUE: Multidetector CT imaging of the abdomen and pelvis was performed using the standard protocol following bolus administration of intravenous contrast. RADIATION DOSE REDUCTION: This exam was performed according to the departmental dose-optimization program which includes automated exposure control, adjustment of the mA and/or kV according to patient size and/or use of iterative reconstruction technique. CONTRAST:  122m OMNIPAQUE  IOHEXOL 300 MG/ML  SOLN COMPARISON:  November 07, 2021 FINDINGS: Lower chest: Emphysematous lung disease is seen within the bilateral lung bases. Hepatobiliary: 6 mm and 11 mm foci of parenchymal low attenuation are seen within the right lobe of the liver. No gallstones, gallbladder wall thickening, or biliary dilatation. Pancreas: Unremarkable. No pancreatic ductal dilatation or surrounding inflammatory changes. Spleen: Normal in size without focal abnormality. Adrenals/Urinary Tract: Adrenal glands are unremarkable. Kidneys are normal in size, without renal calculi or hydronephrosis. Bilateral simple renal cysts are seen. The largest is seen within the posterior aspect of the mid right kidney and measures approximately 2.8 cm in diameter. An 11 mm x 18 mm area of mildly increased attenuation (approximately 51.28 Hounsfield units) is seen within the lateral aspect of the dependent portion of the urinary bladder on the right (axial CT image 60, CT series 2). This is not clearly identified on the prior study. Stomach/Bowel: Stomach is within normal limits. Appendix appears normal. No evidence of bowel  wall thickening, distention, or inflammatory changes. Numerous diverticula are seen throughout the large bowel. Mild thickening of the mid and distal sigmoid colon is also seen. Vascular/Lymphatic: Aortic atherosclerosis. No enlarged abdominal or pelvic lymph nodes. Reproductive: The prostate gland is mildly enlarged, with a 3.2 cm x 3.3 cm x 2.0 cm heterogeneous soft tissue mass seen extending from the posterior aspect of the prostate gland into the anterior aspect of the distal sigmoid colon and rectum (axial CT image 69, CT series 2). This is not clearly identified on the prior study. Other: No abdominal wall hernia or abnormality. No abdominopelvic ascites. Musculoskeletal: Multilevel degenerative changes are seen throughout the lumbar spine. IMPRESSION: 1. Heterogeneous soft tissue mass extending from the posterior aspect of the prostate gland into the anterior aspect of the distal sigmoid colon and rectum. This is not clearly identified on the prior study and is concerning for the presence of an underlying neoplastic process. Correlation with PSA values and physical examination is recommended. 2. Colonic diverticulosis with additional findings suggestive of colitis/diverticulitis involving the mid to distal sigmoid colon. 3. Bilateral simple renal cysts (Bosniak 1). No additional follow-up imaging is recommended. This recommendation follows ACR consensus guidelines: Management of the Incidental Renal Mass on CT: A White Paper of the ACR Incidental Findings Committee. J Am Coll Radiol [phone]. 4. Small area of increased attenuation within the dependent portion of the bladder, as described above, which may represent a small bladder mass. Further evaluation with cystography is recommended. 5. Aortic atherosclerosis. Aortic Atherosclerosis (ICD10-I70.0) and Emphysema (ICD10-J43.9). Electronically Signed   By: Virgina Norfolk M.D.   On: 11/16/2021 18:36   DG ABD ACUTE 2+V W 1V CHEST  Result Date:  11/16/2021 CLINICAL DATA:  Nausea, vomiting, abdominal pain EXAM: DG ABDOMEN ACUTE WITH 1 VIEW CHEST COMPARISON:  Previous studies including the CT enterography done on 11/07/2021 FINDINGS: Cardiac size is within normal limits. Possible coronary artery calcifications/stent are noted. Flattening of diaphragms and decreased bronchovascular markings in both lungs suggest severe COPD. There are small linear densities in both apices, possibly suggesting scarring. There is no focal pulmonary consolidation or signs of pulmonary edema. There is no pleural effusion or pneumothorax. Bowel gas pattern is nonspecific. Small amount of stool is seen in colon. No abnormal masses or calcifications are seen. Degenerative changes are noted in lumbar spine. IMPRESSION: There is no evidence of intestinal obstruction or pneumoperitoneum. No abnormal masses or calcifications are seen. Lumbar spondylosis. Severe COPD. Electronically Signed   By: Royston Cowper  Rathinasamy M.D.   On: 11/16/2021 15:17    Procedures Procedures    Medications Ordered in ED Medications  iohexol (OMNIPAQUE) 9 MG/ML oral solution (has no administration in time range)  acetaminophen (TYLENOL) tablet 650 mg (has no administration in time range)    Or  acetaminophen (TYLENOL) suppository 650 mg (has no administration in time range)  naloxone (NARCAN) injection 0.4 mg (has no administration in time range)  ondansetron (ZOFRAN) injection 4 mg (has no administration in time range)  HYDROmorphone (DILAUDID) injection 0.5 mg (has no administration in time range)  cefTRIAXone (ROCEPHIN) 2 g in sodium chloride 0.9 % 100 mL IVPB (has no administration in time range)  metroNIDAZOLE (FLAGYL) IVPB 500 mg (has no administration in time range)  lactated ringers infusion ( Intravenous New Bag/Given 11/16/21 2014)  fluticasone furoate-vilanterol (BREO ELLIPTA) 200-25 MCG/ACT 1 puff (has no administration in time range)  albuterol (PROVENTIL) (2.5 MG/3ML) 0.083%  nebulizer solution 2.5 mg (has no administration in time range)  pantoprazole (PROTONIX) injection 40 mg (40 mg Intravenous Given 11/16/21 2119)  umeclidinium bromide (INCRUSE ELLIPTA) 62.5 MCG/ACT 1 puff (has no administration in time range)  atorvastatin (LIPITOR) tablet 40 mg (has no administration in time range)  sodium chloride 0.9 % bolus 500 mL (0 mLs Intravenous Stopped 11/16/21 1927)  iohexol (OMNIPAQUE) 300 MG/ML solution 100 mL (100 mLs Intravenous Contrast Given 11/16/21 1817)  cefTRIAXone (ROCEPHIN) 2 g in sodium chloride 0.9 % 100 mL IVPB (0 g Intravenous Stopped 11/16/21 2011)    And  metroNIDAZOLE (FLAGYL) IVPB 500 mg (0 mg Intravenous Stopped 11/16/21 2034)    ED Course/ Medical Decision Making/ A&P                           Medical Decision Making Amount and/or Complexity of Data Reviewed Labs: ordered. Radiology: ordered.  Risk Prescription drug management. Decision regarding hospitalization.    MOO GRAVLEY is a 81 y.o. male with a past medical history significant for hypertension, hyperlipidemia, CAD, COPD, GERD, and recent small bowel obstruction who presents with similar abdominal pain, nausea, vomiting.  Patient reports that for the last week or so patient having on and off episodes of severe abdominal pain and abdominal distention.  He reports she has had intermittent episodes of nausea and vomiting to keep nothing down.  He reports it waxes and wanes and was told to come in for repeat evaluation to rule out recurrent small bowel obstruction.  He says that he is having some loose stools and diarrhea but is passing less gas.  He reports it is diffusely painful on his abdomen he describes as a small pinching and twisting inside him.  He reports nausea and vomit comes and goes but denies any urinary changes.  Denies trauma.  Denies fevers, chills, congestion, cough.  Denies any chest pain or shortness of breath.  Reports the pain is a 5 out of 10 at this time but gets up  to nearly 10 out of 10 at times.  On exam, lungs clear and chest nontender.  Abdomen was nontender but he is still having pain that I cannot reproduce.  He has intact pulses in extremities.  Back was nontender.  Flanks nontender.  Vital signs reassuring on initial evaluation.  Patient had some screening work-up in triage starting including an acute abdominal series that did not show obstruction.  Given the patient's insistence that this pain feels just like his last bowel obstruction, we will  get a CT scan to further evaluate.  We will get screening labs, urinalysis, and give him some fluids as he does have dry mucous membranes.  Anticipate discussion after work-up is completed to determine disposition.  He wants to wait on pain and nausea medicine at this time as it is less severe currently.  7:18 PM Work-up continued to return.  Lactic acid was elevated but started to downtrend after fluids.  Patient's CT scan was obtained and shows evidence of acute diverticulitis but also shows evidence of a possible abdominal mass extending from his prostate towards his bowel as well as a possible bladder wall thickening.  Although he has no urinary symptoms we will add on urinalysis.  I went to reassess the patient and due to his reported 40 pound weight loss, these intermittent bowel obstructions, and this new diverticulitis with the nausea vomiting, and severe pain, I am concerned about sending him home.  We will order antibiotics for the diverticulitis and call for admission.  During his admission, patient may need further evaluation of his possible tumors.          Final Clinical Impression(s) / ED Diagnoses Final diagnoses:  Diverticulitis  Abdominal mass, unspecified abdominal location  Bladder wall thickening     Clinical Impression: 1. Diverticulitis   2. Abdominal mass, unspecified abdominal location   3. Bladder wall thickening     Disposition: Admit  This note was prepared with  assistance of Dragon voice recognition software. Occasional wrong-word or sound-a-like substitutions may have occurred due to the inherent limitations of voice recognition software.     Yocelyn Brocious, Gwenyth Allegra, MD 11/16/21 [phone]

## 2021-11-16 NOTE — ED Triage Notes (Signed)
Sent by PCP to r/o SBO. Pt has had vomiting, abd pain and lack of energy.

## 2021-11-17 ENCOUNTER — Encounter (HOSPITAL_COMMUNITY): Payer: Self-pay | Admitting: Anesthesiology

## 2021-11-17 ENCOUNTER — Other Ambulatory Visit: Payer: Self-pay | Admitting: Urology

## 2021-11-17 DIAGNOSIS — K5792 Diverticulitis of intestine, part unspecified, without perforation or abscess without bleeding: Secondary | ICD-10-CM | POA: Diagnosis not present

## 2021-11-17 LAB — CBC WITH DIFFERENTIAL/PLATELET
Abs Immature Granulocytes: 0.02 10*3/uL (ref 0.00–0.07)
Basophils Absolute: 0.1 10*3/uL (ref 0.0–0.1)
Basophils Relative: 1 %
Eosinophils Absolute: 0.4 10*3/uL (ref 0.0–0.5)
Eosinophils Relative: 6 %
HCT: 35.6 % — ABNORMAL LOW (ref 39.0–52.0)
Hemoglobin: 11.9 g/dL — ABNORMAL LOW (ref 13.0–17.0)
Immature Granulocytes: 0 %
Lymphocytes Relative: 31 %
Lymphs Abs: 2.2 10*3/uL (ref 0.7–4.0)
MCH: 32.1 pg (ref 26.0–34.0)
MCHC: 33.4 g/dL (ref 30.0–36.0)
MCV: 96 fL (ref 80.0–100.0)
Monocytes Absolute: 0.8 10*3/uL (ref 0.1–1.0)
Monocytes Relative: 11 %
Neutro Abs: 3.6 10*3/uL (ref 1.7–7.7)
Neutrophils Relative %: 51 %
Platelets: 245 10*3/uL (ref 150–400)
RBC: 3.71 MIL/uL — ABNORMAL LOW (ref 4.22–5.81)
RDW: 12.9 % (ref 11.5–15.5)
WBC: 7 10*3/uL (ref 4.0–10.5)
nRBC: 0 % (ref 0.0–0.2)

## 2021-11-17 LAB — COMPREHENSIVE METABOLIC PANEL
ALT: 16 U/L (ref 0–44)
AST: 15 U/L (ref 15–41)
Albumin: 3.2 g/dL — ABNORMAL LOW (ref 3.5–5.0)
Alkaline Phosphatase: 56 U/L (ref 38–126)
Anion gap: 8 (ref 5–15)
BUN: 22 mg/dL (ref 8–23)
CO2: 28 mmol/L (ref 22–32)
Calcium: 8.8 mg/dL — ABNORMAL LOW (ref 8.9–10.3)
Chloride: 104 mmol/L (ref 98–111)
Creatinine, Ser: 0.92 mg/dL (ref 0.61–1.24)
GFR, Estimated: >60 mL/min (ref >60)
Glucose, Bld: 96 mg/dL (ref 70–99)
Potassium: 4.1 mmol/L (ref 3.5–5.1)
Sodium: 140 mmol/L (ref 135–145)
Total Bilirubin: 0.6 mg/dL (ref 0.3–1.2)
Total Protein: 5.8 g/dL — ABNORMAL LOW (ref 6.5–8.1)

## 2021-11-17 LAB — PROTIME-INR
INR: 1.1 (ref 0.8–1.2)
Prothrombin Time: 13.8 seconds (ref 11.4–15.2)

## 2021-11-17 LAB — MAGNESIUM: Magnesium: 2.1 mg/dL (ref 1.7–2.4)

## 2021-11-17 LAB — PSA: Prostatic Specific Antigen: 2.55 ng/mL (ref 0.00–4.00)

## 2021-11-17 SURGERY — CYSTOSCOPY
Anesthesia: General

## 2021-11-17 MED ORDER — AMOXICILLIN-POT CLAVULANATE 875-125 MG PO TABS: 1.0000 | ORAL_TABLET | Freq: Two times a day (BID) | ORAL | 0 refills | Status: AC

## 2021-11-17 MED ORDER — ENOXAPARIN SODIUM 40 MG/0.4ML IJ SOSY: 40.0000 mg | PREFILLED_SYRINGE | INTRAMUSCULAR | Status: DC

## 2021-11-17 MED ORDER — SODIUM CHLORIDE 0.9 % IV SOLN: INTRAVENOUS | Status: DC

## 2021-11-17 MED ORDER — LACTATED RINGERS IV SOLN: INTRAVENOUS | Status: DC

## 2021-11-17 NOTE — Discharge Summary (Signed)
Physician Discharge Summary  Austin [name] PZW:[id number] DOB: 1941/01/13 DOA: 11/16/2021  PCP: [facility]  Admit date: 11/16/2021 Discharge date: 11/17/2021 Recommendations for Outpatient Follow-up:  Follow up with PCP in 1 weeks-call for appointment Please obtain BMP/CBC in one week You will need colonoscopy in 6 weeks she will contact GI Office [name]  Discharge Dispo: home Discharge Condition: Stable Code Status:   Code Status: Full Code Diet recommendation:  Diet Order             DIET SOFT Room service appropriate? Yes; Fluid consistency: Thin  Diet effective now                    Brief/Interim Summary: 81-yom with chronic diastolic CHF, COPD, GERD, hypertension, HLD recent hospitalization 9/23 to 9/25 for small bowel obstruction resolved following enterscopy with Dr. Michail Sermon, presented with abdominal pain associated with nausea and vomiting, x3 to 4 days.  Following discharge progressively worsening.  In the ED hemodynamically stable, labs showed a stable.  Lites, renal function and LFTs and CBC but lactic acid is 2.5. CT abdomen/pelvis with contrast showing evidence acute diverticulitis w/o e/o obstruction, abscess, perforation. Also showed interval evidence of new intra-abdominal mass, concerning for malignancy in the setting of recent unintentional weight loss, and no reported prior history of malignancy. Patient was admitted for further management.  Lactic acidosis resolved with IV fluids.  Given ceftriaxone Flagyl, IV fluids.  Patient requesting to go home, he has no abdominal pain nausea vomiting.  If he tolerates diet we will discharge him home on oral antibiotics with instruction to follow-up with GI as outpatient.  Discussed with  [name]  Discharge Diagnoses:  Principal Problem:   Acute diverticulitis Active Problems:   HLD (hyperlipidemia)   Dehydration   Essential hypertension   COPD (chronic obstructive pulmonary disease) (HCC)    Abdominal mass   Lactic acidosis   Acute prerenal azotemia   Chronic diastolic CHF (congestive heart failure) (HCC)  Acute diverticulitis of mid to distal sigmoid colon: Given ceftriaxone Flagyl, IV fluids.  Patient requesting for home, he has no abdominal pain nausea vomiting.  If he tolerates diet we will discharge him home on oral antibiotics with instruction to follow-up with GI as outpatient.  Discussed with  [name]   Heterogeneous soft tissue mass extending from posterior aspect of the prostate gland into the anterior aspect of the distal sigmoid colon and rectum-course with PSA and physical exam advised concerning for underlying neoplastic process.  Ordered PSA- NEG-consulted Dr. Junious Silk from urology-continuous telemetry rectal examination unremarkable, he feels like lipogenic is still shorter will follow-up as outpatient plan for cystoscopy-deferrred further work-up to urology as outpatient.   Lactic acidosis/Dehydration:In the setting of diverticulitis nausea vomiting. Lactic acidosis resolved.   COPD stable continue home inhalers Chronic diastolic CHF-monitor fluid status Hypertension: Holding antihypertensives due to diverticulitis. Hyperlipidemia continue statin GERD continue PPI  Consults: GI, UROLOGY Subjective: Patient doing well requesting for discharge home after he spoke with GI and urology  Discharge Exam: Vitals:   11/17/21 1430 11/17/21 1454  BP: 123/63 132/76  Pulse: 68 64  Resp: 18 18  Temp:  97.9 F (36.6 C)  SpO2: 96% 96%   General: Pt is alert, awake, not in acute distress Cardiovascular: RRR, S1/S2 +, no rubs, no gallops Respiratory: CTA bilaterally, no wheezing, no rhonchi Abdominal: Soft, NT, ND, bowel sounds + Extremities: no edema, no cyanosis  Discharge Instructions  Discharge Instructions  Discharge instructions   Complete by: As directed    Need colonoscopy in 6 weeks.  Follow-up with GI. Continue low fiber diet.  Please call call  MD or return to ER for similar or worsening recurring problem that brought you to hospital or if any fever,nausea/vomiting,abdominal pain, uncontrolled pain, chest pain,  shortness of breath or any other alarming symptoms.  Please follow-up your doctor as instructed in a week time and call the office for appointment.  Please avoid alcohol, smoking, or any other illicit substance and maintain healthy habits including taking your regular medications as prescribed.  You were cared for by a hospitalist during your hospital stay. If you have any questions about your discharge medications or the care you received while you were in the hospital after you are discharged, you can call the unit and ask to speak with the hospitalist on call if the hospitalist that took care of you is not available.  Once you are discharged, your primary care physician will handle any further medical issues. Please note that NO REFILLS for any discharge medications will be authorized once you are discharged, as it is imperative that you return to your primary care physician (or establish a relationship with a primary care physician if you do not have one) for your aftercare needs so that they can reassess your need for medications and monitor your lab values   Increase activity slowly   Complete by: As directed       Allergies as of 11/17/2021   No Known Allergies      Medication List     TAKE these medications    amoxicillin-clavulanate 875-125 MG tablet Commonly known as: AUGMENTIN Take 1 tablet by mouth 2 (two) times daily for 10 days.   atorvastatin 40 MG tablet Commonly known as: LIPITOR Take 1 tablet (40 mg total) by mouth at bedtime. What changed: when to take this   clopidogrel 75 MG tablet Commonly known as: PLAVIX Take 1 tablet (75 mg total) by mouth daily.   famotidine 20 MG tablet Commonly known as: PEPCID Take 20 mg by mouth daily.   famotidine 40 MG tablet Commonly known as: PEPCID Take 1  tablet (40 mg total) by mouth daily.   losartan 25 MG tablet Commonly known as: COZAAR Take 1 tablet (25 mg total) by mouth daily.   metoprolol succinate 25 MG 24 hr tablet Commonly known as: TOPROL-XL Take 1 tablet (25 mg total) by mouth daily.   multivitamin with minerals tablet Take 1 tablet by mouth daily.   nitroGLYCERIN 0.4 MG SL tablet Commonly known as: NITROSTAT Place 1 tablet (0.4 mg total) under the tongue as directed. What changed:  when to take this reasons to take this   ProAir HFA 108 (90 Base) MCG/ACT inhaler Generic drug: albuterol Inhale 1 puff into the lungs every 4 (four) hours as needed for shortness of breath.   Spiriva Respimat 2.5 MCG/ACT Aers Generic drug: Tiotropium Bromide Monohydrate Inhale 2 puffs into the lungs daily.   Symbicort 160-4.5 MCG/ACT inhaler Generic drug: budesonide-formoterol Inhale 2 puffs into the lungs daily.   triamcinolone cream 0.1 % Commonly known as: KENALOG Apply 1 Application topically 2 (two) times daily.        Follow-up Information     Festus Aloe, MD. Schedule an appointment as soon as possible for a visit.   Specialty: Urology Contact information: [place] [address] [place]  [facility] Follow up in 1 week(s).   Specialty: General Practice Contact information: [place] [address] [phone]         Burnell Blanks, MD .   Specialty: Cardiology Contact information: [address] [place] [address] [phone]         Ronnette Juniper, MD Follow up in 1 week(s).   Specialty: Gastroenterology Contact information: [place] [address] [phone]                No Known Allergies  The results of significant diagnostics from this hospitalization (including imaging, microbiology, ancillary and laboratory) are listed below for reference.    Microbiology: No results  found for this or any previous visit (from the past 240 hour(s)).  Procedures/Studies: CT ABDOMEN PELVIS W CONTRAST  Result Date: 11/16/2021 CLINICAL DATA:  Nausea, vomiting and abdominal pain. EXAM: CT ABDOMEN AND PELVIS WITH CONTRAST TECHNIQUE: Multidetector CT imaging of the abdomen and pelvis was performed using the standard protocol following bolus administration of intravenous contrast. RADIATION DOSE REDUCTION: This exam was performed according to the departmental dose-optimization program which includes automated exposure control, adjustment of the mA and/or kV according to patient size and/or use of iterative reconstruction technique. CONTRAST:  189m OMNIPAQUE IOHEXOL 300 MG/ML  SOLN COMPARISON:  November 07, 2021 FINDINGS: Lower chest: Emphysematous lung disease is seen within the bilateral lung bases. Hepatobiliary: 6 mm and 11 mm foci of parenchymal low attenuation are seen within the right lobe of the liver. No gallstones, gallbladder wall thickening, or biliary dilatation. Pancreas: Unremarkable. No pancreatic ductal dilatation or surrounding inflammatory changes. Spleen: Normal in size without focal abnormality. Adrenals/Urinary Tract: Adrenal glands are unremarkable. Kidneys are normal in size, without renal calculi or hydronephrosis. Bilateral simple renal cysts are seen. The largest is seen within the posterior aspect of the mid right kidney and measures approximately 2.8 cm in diameter. An 11 mm x 18 mm area of mildly increased attenuation (approximately 51.28 Hounsfield units) is seen within the lateral aspect of the dependent portion of the urinary bladder on the right (axial CT image 60, CT series 2). This is not clearly identified on the prior study. Stomach/Bowel: Stomach is within normal limits. Appendix appears normal. No evidence of bowel wall thickening, distention, or inflammatory changes. Numerous diverticula are seen throughout the large bowel. Mild thickening of the mid and  distal sigmoid colon is also seen. Vascular/Lymphatic: Aortic atherosclerosis. No enlarged abdominal or pelvic lymph nodes. Reproductive: The prostate gland is mildly enlarged, with a 3.2 cm x 3.3 cm x 2.0 cm heterogeneous soft tissue mass seen extending from the posterior aspect of the prostate gland into the anterior aspect of the distal sigmoid colon and rectum (axial CT image 69, CT series 2). This is not clearly identified on the prior study. Other: No abdominal wall hernia or abnormality. No abdominopelvic ascites. Musculoskeletal: Multilevel degenerative changes are seen throughout the lumbar spine. IMPRESSION: 1. Heterogeneous soft tissue mass extending from the posterior aspect of the prostate gland into the anterior aspect of the distal sigmoid colon and rectum. This is not clearly identified on the prior study and is concerning for the presence of an underlying neoplastic process. Correlation with PSA values and physical examination is recommended. 2. Colonic diverticulosis with additional findings suggestive of colitis/diverticulitis involving the mid to distal sigmoid colon. 3. Bilateral simple renal cysts (Bosniak 1). No additional follow-up imaging is recommended. This recommendation follows ACR consensus guidelines: Management  of the Incidental Renal Mass on CT: A White Paper of the ACR Incidental Findings Committee. J Am Coll Radiol [phone]. 4. Small area of increased attenuation within the dependent portion of the bladder, as described above, which may represent a small bladder mass. Further evaluation with cystography is recommended. 5. Aortic atherosclerosis. Aortic Atherosclerosis (ICD10-I70.0) and Emphysema (ICD10-J43.9). Electronically Signed   By: Virgina Norfolk M.D.   On: 11/16/2021 18:36   DG ABD ACUTE 2+V W 1V CHEST  Result Date: 11/16/2021 CLINICAL DATA:  Nausea, vomiting, abdominal pain EXAM: DG ABDOMEN ACUTE WITH 1 VIEW CHEST COMPARISON:  Previous studies including the CT  enterography done on 11/07/2021 FINDINGS: Cardiac size is within normal limits. Possible coronary artery calcifications/stent are noted. Flattening of diaphragms and decreased bronchovascular markings in both lungs suggest severe COPD. There are small linear densities in both apices, possibly suggesting scarring. There is no focal pulmonary consolidation or signs of pulmonary edema. There is no pleural effusion or pneumothorax. Bowel gas pattern is nonspecific. Small amount of stool is seen in colon. No abnormal masses or calcifications are seen. Degenerative changes are noted in lumbar spine. IMPRESSION: There is no evidence of intestinal obstruction or pneumoperitoneum. No abnormal masses or calcifications are seen. Lumbar spondylosis. Severe COPD. Electronically Signed   By: Elmer Picker M.D.   On: 11/16/2021 15:17   CT ENTERO ABD/PELVIS W CONTAST  Result Date: 11/07/2021 CLINICAL DATA:  Nausea.  Concern for small bowel obstruction. EXAM: CT ABDOMEN AND PELVIS WITH CONTRAST (ENTEROGRAPHY) TECHNIQUE: Multidetector CT of the abdomen and pelvis during bolus administration of intravenous contrast. Negative oral contrast was given. RADIATION DOSE REDUCTION: This exam was performed according to the departmental dose-optimization program which includes automated exposure control, adjustment of the mA and/or kV according to patient size and/or use of iterative reconstruction technique. CONTRAST:  171m OMNIPAQUE IOHEXOL 350 MG/ML SOLN COMPARISON:  11/05/2021 FINDINGS: Lower chest:  Advanced changes of centrilobular emphysema. Hepatobiliary: Peripherally enhancing lesion within segment 4 a measures 7 mm and likely represents a small hemangioma, image 9/3. Low-density structure within inferior medial right lobe of liver measures 7 mm and is too small to characterize. There is been vicarious excretion of contrast material into the gallbladder. No signs of gallbladder inflammation. No bile duct dilatation.  Pancreas: No mass, inflammatory changes, or other significant abnormality. Spleen: Within normal limits in size and appearance. Adrenals/Urinary Tract: Normal adrenal glands. No signs of nephrolithiasis or hydronephrosis. Bilateral Bosniak class 1 and 2 kidney cysts noted. No follow-up imaging recommended. There is moderate diffuse distension of the urinary bladder containing excreted contrast material. No focal bladder lesion identified. Stomach/Bowel: Small to moderate hiatal hernia identified. Interval decrease in small bowel dilatation compatible with resolution of bowel obstruction. There is enteric contrast material identified throughout the colon up to the level of the rectum. There is a unusual appearance of asymmetric low-attenuation adjacent to the base of cecum and along the lateral wall of the terminal ileum measuring 4.1 x 2.2 cm, image 112/5. This may be encasing the appendix, image 112/5 and image 51/7. Primary differential considerations include chronic low-attenuation wall thickening due to chronic colitis, decompressed cecum, or low density neoplasm such as mucinous adenocarcinoma. Sigmoid diverticulosis identified without signs of acute diverticulitis. Vascular/Lymphatic: Aortic atherosclerosis. No aneurysm. No signs of abdominopelvic adenopathy. Reproductive: No mass or other significant abnormality. Other: No free fluid or fluid collections identified. Musculoskeletal: No acute or suspicious osseous findings. Degenerative disc disease noted within the lumbar spine. IMPRESSION: 1. Interval decrease in  small bowel dilatation compatible with resolution of small bowel obstruction. 2. There is a unusual appearance of asymmetric low-attenuation adjacent to the base of cecum and along the lateral wall of the terminal ileum, which may be encasing the appendix. Primary differential considerations include chronic low-attenuation wall thickening due to colitis, unusual appearance of decompressed cecum, or  low density neoplasm such as mucinous adenocarcinoma. Correlation with colon cancer screening advised. 3. Aortic Atherosclerosis (ICD10-I70.0) and Emphysema (ICD10-J43.9). Electronically Signed   By: Kerby Moors M.D.   On: 11/07/2021 16:00   DG Abd 2 Views  Result Date: 11/06/2021 CLINICAL DATA:  81 year old male with abdominal pain and small-bowel obstruction. EXAM: ABDOMEN - 2 VIEW COMPARISON:  11/05/2021 and earlier. FINDINGS: Upright and supine views of the abdomen and pelvis at 0909 hours. Enteric tube has been removed. Oral contrast administered since the CT Abdomen and Pelvis on 11/05/2021 is now present in the right colon. Excreted IV contrast also in the urinary bladder. Nonobstructed bowel-gas pattern now. No pneumoperitoneum. Negative lung bases. Normal cardiac size. No acute osseous abnormality identified. IMPRESSION: 1. Resolved small bowel obstruction since yesterday with enteric contrast now in the right colon. No pneumoperitoneum. 2. Removed enteric tube. Electronically Signed   By: Genevie Ann M.D.   On: 11/06/2021 09:24   DG Abdomen 1 View  Result Date: 11/05/2021 CLINICAL DATA:  NG-tube EXAM: ABDOMEN - 1 VIEW COMPARISON:  Abdominal x-ray 11/05/2021 FINDINGS: Nasogastric tube tip is now at the level of the distal stomach. Dilated small bowel loops are again seen. Lung bases are clear. IMPRESSION: Nasogastric tube tip now at the level of the distal stomach. Electronically Signed   By: Ronney Asters M.D.   On: 11/05/2021 21:59   DG Abd Portable 1V-Small Bowel Protocol-Position Verification  Result Date: 11/05/2021 CLINICAL DATA:  NG tube placement EXAM: PORTABLE ABDOMEN - 1 VIEW COMPARISON:  CT abdomen and pelvis earlier today FINDINGS: Interval placement of enteric tube. The enteric tube is looped in the stomach with tip directed superiorly likely within the distal esophagus. Repositioning is recommended prior to use. Partially visualized dilated loops of small bowel compatible with  obstruction as seen on CT earlier today. IMPRESSION: Enteric tube tip directed superiorly within the distal esophagus. Recommend repositioning prior to use. Dilated loops of small bowel compatible with obstruction. Electronically Signed   By: Placido Sou M.D.   On: 11/05/2021 21:19   CT ABDOMEN PELVIS W CONTRAST  Result Date: 11/05/2021 CLINICAL DATA:  Abdominal pain. EXAM: CT ABDOMEN AND PELVIS WITH CONTRAST TECHNIQUE: Multidetector CT imaging of the abdomen and pelvis was performed using the standard protocol following bolus administration of intravenous contrast. RADIATION DOSE REDUCTION: This exam was performed according to the departmental dose-optimization program which includes automated exposure control, adjustment of the mA and/or kV according to patient size and/or use of iterative reconstruction technique. CONTRAST:  24m OMNIPAQUE IOHEXOL 350 MG/ML SOLN COMPARISON:  None Available. FINDINGS: Lower chest: Moderate emphysema. Hepatobiliary: 2 few mm hypoattenuated lesions in the liver, too small to be actually characterize by CT. Decompressed gallbladder. Pancreas: Unremarkable. No pancreatic ductal dilatation or surrounding inflammatory changes. Spleen: Normal in size without focal abnormality. Adrenals/Urinary Tract: Normal adrenal glands. 2.2 cm renal cyst in the midpole region of the right kidney. Smaller less than 1 cm probable cysts in the lower pole of the left kidney. Normal urinary bladder. Stomach/Bowel: Normal appearance of the stomach. Fluid-filled distended small bowel loops measure up to 4 cm with a transitional point in the right mid  abdomen. Diffuse left colonic diverticulosis with associated long segment circumferential mucosal wall thickening of the descending and sigmoid colon suggestive of chronic colitis. No significant pericolonic inflammatory changes. Vascular/Lymphatic: Aortic atherosclerosis. No enlarged abdominal or pelvic lymph nodes. Reproductive: Normal sized of the  prostate gland. Other: No abdominal wall hernia or abnormality. No abdominopelvic ascites. Musculoskeletal: Spondylosis of the lumbosacral spine. IMPRESSION: 1. Small bowel obstruction with transitional point in the right mid abdomen. 2. Diffuse left colonic diverticulosis with associated long segment circumferential mucosal wall thickening of the descending and sigmoid colon suggestive of chronic colitis. No significant pericolonic inflammatory changes. 3. 2 few mm hypoattenuated lesions in the liver, too small to be actually characterize by CT. 4. Benign right renal cyst. 5. Moderate emphysema. Aortic Atherosclerosis (ICD10-I70.0) and Emphysema (ICD10-J43.9). Electronically Signed   By: Fidela Salisbury M.D.   On: 11/05/2021 19:24    Labs: BNP (last 3 results) No results for input(s): "BNP" in the last 8760 hours. Basic Metabolic Panel: Recent Labs  Lab 11/16/21 1514 11/17/21 0436  NA 137 140  K 3.5 4.1  CL 100 104  CO2 30 28  GLUCOSE 135* 96  BUN 32* 22  CREATININE 1.18 0.92  CALCIUM 9.6 8.8*  MG 2.3 2.1   Liver Function Tests: Recent Labs  Lab 11/16/21 1514 11/17/21 0436  AST 19 15  ALT 22 16  ALKPHOS 68 56  BILITOT 0.8 0.6  PROT 6.9 5.8*  ALBUMIN 3.9 3.2*   Recent Labs  Lab 11/16/21 1514  LIPASE 27   No results for input(s): "AMMONIA" in the last 168 hours. CBC: Recent Labs  Lab 11/16/21 1514 11/17/21 0436  WBC 9.2 7.0  NEUTROABS 6.4 3.6  HGB 13.6 11.9*  HCT 41.2 35.6*  MCV 96.5 96.0  PLT 288 245   Cardiac Enzymes: No results for input(s): "CKTOTAL", "CKMB", "CKMBINDEX", "TROPONINI" in the last 168 hours. BNP: Invalid input(s): "POCBNP" CBG: No results for input(s): "GLUCAP" in the last 168 hours. D-Dimer No results for input(s): "DDIMER" in the last 72 hours. Hgb A1c No results for input(s): "HGBA1C" in the last 72 hours. Lipid Profile No results for input(s): "CHOL", "HDL", "LDLCALC", "TRIG", "CHOLHDL", "LDLDIRECT" in the last 72 hours. Thyroid  function studies No results for input(s): "TSH", "T4TOTAL", "T3FREE", "THYROIDAB" in the last 72 hours.  Invalid input(s): "FREET3" Anemia work up No results for input(s): "VITAMINB12", "FOLATE", "FERRITIN", "TIBC", "IRON", "RETICCTPCT" in the last 72 hours. Urinalysis    Component Value Date/Time   COLORURINE YELLOW 11/16/2021 2017   APPEARANCEUR CLEAR 11/16/2021 2017   LABSPEC 1.016 11/16/2021 2017   PHURINE 6.0 11/16/2021 2017   GLUCOSEU NEGATIVE 11/16/2021 2017   HGBUR NEGATIVE 11/16/2021 2017   BILIRUBINUR NEGATIVE 11/16/2021 2017   KETONESUR NEGATIVE 11/16/2021 2017   PROTEINUR NEGATIVE 11/16/2021 2017   UROBILINOGEN 1.0 11/05/2008 1811   NITRITE NEGATIVE 11/16/2021 2017   LEUKOCYTESUR NEGATIVE 11/16/2021 2017   Sepsis Labs Recent Labs  Lab 11/16/21 1514 11/17/21 0436  WBC 9.2 7.0   Microbiology No results found for this or any previous visit (from the past 240 hour(s)).   Time coordinating discharge: 25 minutes  SIGNED: Antonieta Pert, MD  [facility] 11/17/2021, 3:29 PM  If 7PM-7AM, please contact night-coverage [email or web address]

## 2021-11-17 NOTE — Consult Note (Signed)
Consultation: Hyperdense material in bladder, possible prostate mass  History of Present Illness: Austin [name] is an 81 year old male who was recently in the hospital at 10 days ago for small bowel obstruction.  This resolved.  He then had some abdominal pain last night with nausea vomiting and diarrhea.  He underwent repeat 11/16/2021 CT scan which showed a possible posterior prostate/anterior rectal mass.  He also had some hyperdense tissue or fluid in the right posterior bladder.  Of note he just had a CT abdomen and pelvis 11/07/2021 which showed a normal bladder and a normal prostate. No hydronephrosis, LAd or bone lesions.   Patient reports he has been voiding without difficulty.  There is a urinal on the side of the bed with clear urine.  He denies any gross hematuria or dysuria. His UA was negative.  He reports his prostate has been checked out at the [place] and always noted to be normal.  His PSA today is 2.55.  Of note, he has had several bowel movements or loose stools since the CT scan yesterday.  Past Medical History:  Diagnosis Date   CAD (coronary artery disease)    COPD (chronic obstructive pulmonary disease) (HCC)    Dyspnea on exertion    GERD (gastroesophageal reflux disease)    Hyperlipidemia    MI (myocardial infarction) ([place])    Rectal bleeding    SBO (small bowel obstruction) ([place])    Sept '23   Tobacco abuse    Unspecified essential hypertension    Past Surgical History:  Procedure Laterality Date   CORONARY ANGIOPLASTY WITH STENT PLACEMENT     Drug eluting stent placed to the first obtuse marginal with a 2.58 mm cypher drug eluting stent. Circumflex was treated with 3.0 x 18 mm TAXUS drug-eluting stent on 07/17/03   ENTEROSCOPY N/A 11/07/2021   Procedure: ENTEROSCOPY;  Surgeon: Wilford Corner, MD;  Location: [facility];  Service: Gastroenterology;  Laterality: N/A;   EYE SURGERY  1971   bilateral   PTCA     And drug-eluting stent to the RCA   RECTAL SURGERY  2008   dr.  Rosana Hoes did this surgery    Home Medications:  (Not in a hospital admission)  Allergies: No Known Allergies  Family History  Problem Relation Age of Onset   Cancer Mother        cervical   Lung disease Father        Black lung   Social History:  reports that he has quit smoking. His smoking use included cigarettes. He smoked an average of 1 pack per day. He has never used smokeless tobacco. He reports current alcohol use. He reports that he does not use drugs.  ROS: A complete review of systems was performed.  All systems are negative except for pertinent findings as noted. Review of Systems  Gastrointestinal:  Positive for abdominal pain and diarrhea.  All other systems reviewed and are negative.    Physical Exam:  Vital signs in last 24 hours: Temp:  [97.6 F (36.4 C)-98.5 F (36.9 C)] 97.8 F (36.6 C) (10/05 1213) Pulse Rate:  [49-91] 61 (10/05 1100) Resp:  [14-23] 18 (10/05 1100) BP: (119-173)/(47-83) 120/57 (10/05 1100) SpO2:  [84 %-100 %] 95 % (10/05 1100) Weight:  [57.2 kg] 57.2 kg (10/04 1425) General:  Alert and oriented, No acute distress HEENT: Normocephalic, atraumatic Cardiovascular: Regular rate and rhythm Lungs: Regular rate and effort Abdomen: Soft, nontender, nondistended, no abdominal masses Back: No CVA tenderness Extremities: No edema Neurologic:  Grossly intact DRE: Anus appears normal without lesion, sphincter tone normal, rectal exam normal without mass.  Prostate palpably normal, smooth without mass or nodule and about 50 g.  Laboratory Data:  Results for orders placed or performed during the hospital encounter of 11/16/21 (from the past 24 hour(s))  CBC with Differential     Status: None   Collection Time: 11/16/21  3:14 PM  Result Value Ref Range   WBC 9.2 4.0 - 10.5 K/uL   RBC 4.27 4.22 - 5.81 MIL/uL   Hemoglobin 13.6 13.0 - 17.0 g/dL   HCT 41.2 39.0 - 52.0 %   MCV 96.5 80.0 - 100.0 fL   MCH 31.9 26.0 - 34.0 pg   MCHC 33.0 30.0 - 36.0  g/dL   RDW 12.8 11.5 - 15.5 %   Platelets 288 150 - 400 K/uL   nRBC 0.0 0.0 - 0.2 %   Neutrophils Relative % 70 %   Neutro Abs 6.4 1.7 - 7.7 K/uL   Lymphocytes Relative 16 %   Lymphs Abs 1.5 0.7 - 4.0 K/uL   Monocytes Relative 11 %   Monocytes Absolute 1.0 0.1 - 1.0 K/uL   Eosinophils Relative 2 %   Eosinophils Absolute 0.2 0.0 - 0.5 K/uL   Basophils Relative 1 %   Basophils Absolute 0.1 0.0 - 0.1 K/uL   Immature Granulocytes 0 %   Abs Immature Granulocytes 0.03 0.00 - 0.07 K/uL  Comprehensive metabolic panel     Status: Abnormal   Collection Time: 11/16/21  3:14 PM  Result Value Ref Range   Sodium 137 135 - 145 mmol/L   Potassium 3.5 3.5 - 5.1 mmol/L   Chloride 100 98 - 111 mmol/L   CO2 30 22 - 32 mmol/L   Glucose, Bld 135 (H) 70 - 99 mg/dL   BUN 32 (H) 8 - 23 mg/dL   Creatinine, Ser 1.18 0.61 - 1.24 mg/dL   Calcium 9.6 8.9 - 10.3 mg/dL   Total Protein 6.9 6.5 - 8.1 g/dL   Albumin 3.9 3.5 - 5.0 g/dL   AST 19 15 - 41 U/L   ALT 22 0 - 44 U/L   Alkaline Phosphatase 68 38 - 126 U/L   Total Bilirubin 0.8 0.3 - 1.2 mg/dL   GFR, Estimated >60 >60 mL/min   Anion gap 7 5 - 15  Lipase, blood     Status: None   Collection Time: 11/16/21  3:14 PM  Result Value Ref Range   Lipase 27 11 - 51 U/L  Magnesium     Status: None   Collection Time: 11/16/21  3:14 PM  Result Value Ref Range   Magnesium 2.3 1.7 - 2.4 mg/dL  Salicylate level     Status: Abnormal   Collection Time: 11/16/21  3:17 PM  Result Value Ref Range   Salicylate Lvl <5.9 (L) 7.0 - 30.0 mg/dL  Lactic acid, plasma     Status: Abnormal   Collection Time: 11/16/21  3:49 PM  Result Value Ref Range   Lactic Acid, Venous 2.5 (HH) 0.5 - 1.9 mmol/L  Lactic acid, plasma     Status: None   Collection Time: 11/16/21  5:52 PM  Result Value Ref Range   Lactic Acid, Venous 1.7 0.5 - 1.9 mmol/L  Urinalysis, Routine w reflex microscopic Urine, Clean Catch     Status: None   Collection Time: 11/16/21  8:17 PM  Result Value Ref  Range   Color, Urine YELLOW YELLOW   APPearance CLEAR  CLEAR   Specific Gravity, Urine 1.016 1.005 - 1.030   pH 6.0 5.0 - 8.0   Glucose, UA NEGATIVE NEGATIVE mg/dL   Hgb urine dipstick NEGATIVE NEGATIVE   Bilirubin Urine NEGATIVE NEGATIVE   Ketones, ur NEGATIVE NEGATIVE mg/dL   Protein, ur NEGATIVE NEGATIVE mg/dL   Nitrite NEGATIVE NEGATIVE   Leukocytes,Ua NEGATIVE NEGATIVE  CBC with Differential/Platelet     Status: Abnormal   Collection Time: 11/17/21  4:36 AM  Result Value Ref Range   WBC 7.0 4.0 - 10.5 K/uL   RBC 3.71 (L) 4.22 - 5.81 MIL/uL   Hemoglobin 11.9 (L) 13.0 - 17.0 g/dL   HCT 35.6 (L) 39.0 - 52.0 %   MCV 96.0 80.0 - 100.0 fL   MCH 32.1 26.0 - 34.0 pg   MCHC 33.4 30.0 - 36.0 g/dL   RDW 12.9 11.5 - 15.5 %   Platelets 245 150 - 400 K/uL   nRBC 0.0 0.0 - 0.2 %   Neutrophils Relative % 51 %   Neutro Abs 3.6 1.7 - 7.7 K/uL   Lymphocytes Relative 31 %   Lymphs Abs 2.2 0.7 - 4.0 K/uL   Monocytes Relative 11 %   Monocytes Absolute 0.8 0.1 - 1.0 K/uL   Eosinophils Relative 6 %   Eosinophils Absolute 0.4 0.0 - 0.5 K/uL   Basophils Relative 1 %   Basophils Absolute 0.1 0.0 - 0.1 K/uL   Immature Granulocytes 0 %   Abs Immature Granulocytes 0.02 0.00 - 0.07 K/uL  Comprehensive metabolic panel     Status: Abnormal   Collection Time: 11/17/21  4:36 AM  Result Value Ref Range   Sodium 140 135 - 145 mmol/L   Potassium 4.1 3.5 - 5.1 mmol/L   Chloride 104 98 - 111 mmol/L   CO2 28 22 - 32 mmol/L   Glucose, Bld 96 70 - 99 mg/dL   BUN 22 8 - 23 mg/dL   Creatinine, Ser 0.92 0.61 - 1.24 mg/dL   Calcium 8.8 (L) 8.9 - 10.3 mg/dL   Total Protein 5.8 (L) 6.5 - 8.1 g/dL   Albumin 3.2 (L) 3.5 - 5.0 g/dL   AST 15 15 - 41 U/L   ALT 16 0 - 44 U/L   Alkaline Phosphatase 56 38 - 126 U/L   Total Bilirubin 0.6 0.3 - 1.2 mg/dL   GFR, Estimated >60 >60 mL/min   Anion gap 8 5 - 15  Magnesium     Status: None   Collection Time: 11/17/21  4:36 AM  Result Value Ref Range   Magnesium 2.1  1.7 - 2.4 mg/dL  Protime-INR     Status: None   Collection Time: 11/17/21  4:36 AM  Result Value Ref Range   Prothrombin Time 13.8 11.4 - 15.2 seconds   INR 1.1 0.8 - 1.2  PSA     Status: None   Collection Time: 11/17/21  8:30 AM  Result Value Ref Range   Prostatic Specific Antigen 2.55 0.00 - 4.00 ng/mL   No results found for this or any previous visit (from the past 240 hour(s)). Creatinine: Recent Labs    11/16/21 1514 11/17/21 0436  CREATININE 1.18 0.92    Impression/Assessment/plan:  #1 prostate/rectal mass-this is likely some echogenic stool seen on the CT unless it was some type of fold in the bowel wall.  His rectal exam is normal and he has no rectal or prostate mass on exam.  His PSA is normal and CT from 10 days ago  was also normal without prostate mass.  He may benefit from direct visualization but I will leave that up to GI.  #2 bladder neoplasm/hyperdense lesion or fluid-discussed these findings again with the patient and his wife.  He would benefit and I recommended outpatient cystoscopy.  Number and follow-up on chart.  I will sign off but please notify [facility] on-call with any changes in patient's status, questions or concerns. Discussed with Dr. Lupita Leash.   Festus Aloe 11/17/2021, 12:49 PM

## 2021-11-17 NOTE — Hospital Course (Addendum)
81-yom with chronic diastolic CHF, COPD, GERD, hypertension, HLD recent hospitalization 9/23 to 9/25 for small bowel obstruction resolved following enterscopy with Dr. Michail Sermon, presented with abdominal pain associated with nausea and vomiting, x3 to 4 days.  Following discharge progressively worsening.  In the ED hemodynamically stable, labs showed a stable.  Lites, renal function and LFTs and CBC but lactic acid is 2.5. CT abdomen/pelvis with contrast showing evidence acute diverticulitis w/o e/o obstruction, abscess, perforation. Also showed interval evidence of new intra-abdominal mass, concerning for malignancy in the setting of recent unintentional weight loss, and no reported prior history of malignancy. Patient was admitted for further management.  Lactic acidosis resolved with IV fluids.

## 2021-11-17 NOTE — Consult Note (Addendum)
Referring Provider: [facility] Primary Care Physician:  [facility] [place] Primary Gastroenterologist:  Althia Forts ([place])  Reason for Consultation: Abnormal CT scan  HPI: Austin [name] is a 81 y.o. male past medical history of CAD, COPD, GERD, hyperlipidemia, hypertension.  Presented to the emergency department for 3 days of abdominal pain, nausea nd vomiting that was not improving.   Patient reports that starting Monday he began to have lower abdominal pain he had associated nausea vomiting and diarrhea.  Notes nonbloody emesis and diarrhea.  He had 7 episodes of emesis on Monday.  Abdominal pain is sharp, constant and pinching in his lower abdomen.  On Tuesday he continued to vomit saying about 12 episodes and no improvement of his abdominal pain he began to decrease oral intake.  Wednesday due to no improvement in his symptoms he decided to present to the ED.   Most recent presentation to the emergency department, normal CBC, elevation of BUN at 32 otherwise normal CMP, no elevation of lipase.  CT abdomen pelvis with contrast with findings of soft tissue mass of the posterior aspect of the prostate gland into the distal sigmoid colon and rectum, colonic diverticulosis with diverticulitis in the distal sigmoid colon, possible bladder mass, GI was consulted.  He mentions he would have episodes of similar symptoms of abdominal pain, nausea, vomiting, diarrhea since April this is occurred about once per month.  He was recently admitted 9/23 to 9/25 for small bowel obstruction and chronic colitis small bowel obstruction improved, patient tolerating oral diet, he underwent upper GI push enteroscopy 9/25 which was unremarkable, underwent CT enteroscopy 9/25 showed resolution of small bowel obstruction, noted unusual appearance of the base of the cecum along the lateral wall of the terminal ileum possible due to colitis or low-density neoplasm.  He was able to tolerate oral diet and sent  home.  Patient takes Plavix, last dose yesterday morning.  Denies NSAID use.   Notes 40 pound weight loss in the last 8 months which was unintentional.  Colonoscopy 01/13/2011 with Dr. Watt Climes External hemorrhoids, internal hemorrhoids, diverticula, 3 semisessile polyps found to be polypoid colorectal mucosa  Notes he has had EGD and colonoscopy in the last 4 to 5 years at [facility]. Past Medical History:  Diagnosis Date   CAD (coronary artery disease)    COPD (chronic obstructive pulmonary disease) (HCC)    Dyspnea on exertion    GERD (gastroesophageal reflux disease)    Hyperlipidemia    MI (myocardial infarction) ([place])    Rectal bleeding    SBO (small bowel obstruction) ([place])    Sept '23   Tobacco abuse    Unspecified essential hypertension     Past Surgical History:  Procedure Laterality Date   CORONARY ANGIOPLASTY WITH STENT PLACEMENT     Drug eluting stent placed to the first obtuse marginal with a 2.58 mm cypher drug eluting stent. Circumflex was treated with 3.0 x 18 mm TAXUS drug-eluting stent on 07/17/03   ENTEROSCOPY N/A 11/07/2021   Procedure: ENTEROSCOPY;  Surgeon: Wilford Corner, MD;  Location: [place];  Service: Gastroenterology;  Laterality: N/A;   EYE SURGERY  1971   bilateral   PTCA     And drug-eluting stent to the RCA   RECTAL SURGERY  2008   dr. Rosana Hoes did this surgery    Prior to Admission medications   Medication Sig Start Date End Date Taking? Authorizing Provider  atorvastatin (LIPITOR) 40 MG tablet Take 1 tablet (40 mg total) by mouth  at bedtime. Patient taking differently: Take 40 mg by mouth daily. 12/10/20  Yes Dunn, Dayna N, PA-C  clopidogrel (PLAVIX) 75 MG tablet Take 1 tablet (75 mg total) by mouth daily. 12/10/20  Yes Dunn, Dayna N, PA-C  famotidine (PEPCID) 20 MG tablet Take 20 mg by mouth daily.   Yes [provider]  losartan (COZAAR) 25 MG tablet Take 1 tablet (25 mg total) by mouth daily. 12/10/20  Yes Dunn, Dayna N, PA-C   metoprolol succinate (TOPROL-XL) 25 MG 24 hr tablet Take 1 tablet (25 mg total) by mouth daily. 11/07/21  Yes Lavina Hamman, MD  Multiple Vitamins-Minerals (MULTIVITAMIN WITH MINERALS) tablet Take 1 tablet by mouth daily.   Yes [provider]  nitroGLYCERIN (NITROSTAT) 0.4 MG SL tablet Place 1 tablet (0.4 mg total) under the tongue as directed. Patient taking differently: Place 0.4 mg under the tongue every 5 (five) minutes as needed for chest pain. 11/10/19  Yes Burnell Blanks, MD  PROAIR HFA [phone] Base) MCG/ACT inhaler Inhale 1 puff into the lungs every 4 (four) hours as needed for shortness of breath. 01/22/16  Yes [provider]  SPIRIVA RESPIMAT 2.5 MCG/ACT AERS Inhale 2 puffs into the lungs daily. 12/23/18  Yes [provider]  SYMBICORT 160-4.5 MCG/ACT inhaler Inhale 2 puffs into the lungs daily. 12/23/18  Yes [provider]  triamcinolone cream (KENALOG) 0.1 % Apply 1 Application topically 2 (two) times daily. 11/04/20  Yes [provider]  famotidine (PEPCID) 40 MG tablet Take 1 tablet (40 mg total) by mouth daily. Patient not taking: Reported on 11/16/2021 10/04/21   Rondel Oh, MD    Scheduled Meds:  atorvastatin  40 mg Oral QHS   fluticasone furoate-vilanterol  1 puff Inhalation Daily   pantoprazole (PROTONIX) IV  40 mg Intravenous Q24H   umeclidinium bromide  1 puff Inhalation Daily   Continuous Infusions:  sodium chloride 50 mL/hr at 11/17/21 0831   cefTRIAXone (ROCEPHIN)  IV     metronidazole Stopped (11/17/21 0700)   PRN Meds:.acetaminophen **OR** acetaminophen, albuterol, HYDROmorphone (DILAUDID) injection, naLOXone (NARCAN)  injection, ondansetron (ZOFRAN) IV  Allergies as of 11/16/2021   (No Known Allergies)    Family History  Problem Relation Age of Onset   Cancer Mother        cervical   Lung disease Father        Black lung    Social History   Socioeconomic History   Marital status: Married     Spouse name: Not on file   Number of children: Not on file   Years of education: Not on file   Highest education level: Not on file  Occupational History   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  Vaping Use   Vaping Use: Never used  Substance and Sexual Activity   Alcohol use: Yes   Drug use: No   Sexual activity: Not on file  Other Topics Concern   Not on file  Social History Narrative   Lives in [place] with his wife   [place] veteran   Exercises 3 times a week at the [employer]  Financial Resource Strain: Not on file  Food Insecurity: No Food Insecurity (11/16/2021)   Hunger Vital Sign    Worried About Running Out of Food in the Last Year: Never true    Ran Out of Food in the Last Year: Never true  Transportation Needs: No Transportation Needs (11/16/2021)   PRAPARE - Hydrologist (Medical): No    Lack of Transportation (Non-Medical): No  Physical Activity: Not on file  Stress: Not on file  Social Connections: Not on file  Intimate Partner Violence: Not At Risk (11/16/2021)   Humiliation, Afraid, Rape, and Kick questionnaire    Fear of Current or Ex-Partner: No    Emotionally Abused: No    Physically Abused: No    Sexually Abused: No    Review of Systems: All negative except as stated above in HPI.  Physical Exam:Physical Exam Constitutional:      General: He is not in acute distress. HENT:     Head: Normocephalic and atraumatic.     Right Ear: External ear normal.     Left Ear: External ear normal.     Nose: Nose normal.     Mouth/Throat:     Mouth: Mucous membranes are moist.  Eyes:     Pupils: Pupils are equal, round, and  reactive to light.  Cardiovascular:     Rate and Rhythm: Normal rate and regular rhythm.     Pulses: Normal pulses.     Heart sounds: Normal heart sounds.  Pulmonary:     Effort: Pulmonary effort is normal.     Breath sounds: Normal breath sounds.  Abdominal:     General: Abdomen is flat. Bowel sounds are normal. There is no distension.     Palpations: Abdomen is soft. There is no mass.     Tenderness: There is abdominal tenderness (mild lower abdominal tenderness). There is no guarding or rebound.     Hernia: No hernia is present.  Musculoskeletal:        General: Normal range of motion.     Cervical back: Normal range of motion and neck supple.  Skin:    General: Skin is warm and dry.     Coloration: Skin is not pale.  Neurological:     General: No focal deficit present.     Mental Status: He is alert and oriented to person, place, and time. Mental status is at baseline.  Psychiatric:        Mood and Affect: Mood normal.        Behavior: Behavior normal.     Vital signs: Vitals:   11/17/21 0808 11/17/21 0809  BP: (!) 140/65   Pulse: 75   Resp: (!) 23   Temp:  97.8 F (36.6 C)  SpO2: 95%         GI:  Lab Results: Recent Labs    11/16/21 1514 11/17/21 0436  WBC 9.2 7.0  HGB 13.6 11.9*  HCT 41.2 35.6*  PLT 288 245   BMET Recent Labs    11/16/21 1514 11/17/21 0436  NA 137 140  K 3.5 4.1  CL 100 104  CO2 30 28  GLUCOSE 135* 96  BUN 32* 22  CREATININE 1.18 0.92  CALCIUM 9.6 8.8*   LFT Recent Labs    11/17/21 0436  PROT 5.8*  ALBUMIN 3.2*  AST 15  ALT 16  ALKPHOS 56  BILITOT 0.6   PT/INR Recent Labs    11/17/21 0436  LABPROT 13.8  INR 1.1     Studies/Results: CT ABDOMEN PELVIS W CONTRAST  Result Date: 11/16/2021 CLINICAL DATA:  Nausea, vomiting and abdominal pain. EXAM: CT ABDOMEN AND PELVIS WITH CONTRAST TECHNIQUE: Multidetector CT imaging of the abdomen and pelvis was performed using the standard protocol following bolus  administration of intravenous contrast. RADIATION DOSE REDUCTION: This exam was performed according to the departmental dose-optimization program which includes automated exposure control, adjustment of the mA and/or kV according to patient size and/or use of iterative reconstruction technique. 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 Hepatobiliary: 6 mm and 11 mm foci of parenchymal low attenuation are seen within the right lobe of the liver. No gallstones, gallbladder wall thickening, or biliary dilatation. Pancreas: Unremarkable. No pancreatic ductal dilatation or surrounding inflammatory changes. Spleen: Normal in size without focal abnormality. Adrenals/Urinary Tract: Adrenal glands are unremarkable. Kidneys are normal in size, without renal calculi or hydronephrosis. Bilateral simple renal cysts are seen. The largest is seen within the posterior aspect of the mid right kidney and measures approximately 2.8 cm in diameter. An 11 mm x 18 mm area of mildly increased attenuation (approximately 51.28 Hounsfield units) is seen within the lateral aspect of the dependent portion of the urinary bladder on the right (axial CT image 60, CT series 2). This is not clearly identified on the prior study. Stomach/Bowel: Stomach is within normal limits. Appendix appears normal. No evidence of bowel wall thickening, distention, or inflammatory changes. Numerous diverticula are seen throughout the large bowel. Mild thickening of the mid and distal sigmoid colon is also seen. Vascular/Lymphatic: Aortic atherosclerosis. No enlarged abdominal or pelvic lymph nodes. Reproductive: The prostate gland is mildly enlarged, with a 3.2 cm x 3.3 cm x 2.0 cm heterogeneous soft tissue mass seen extending from the posterior aspect of the prostate gland into the anterior aspect of the distal sigmoid colon and  rectum (axial CT image 69, CT series 2). This is not clearly identified on the prior study. Other: No abdominal wall hernia or abnormality. No abdominopelvic ascites. Musculoskeletal: Multilevel degenerative changes are seen throughout the lumbar spine. IMPRESSION: 1. Heterogeneous soft tissue mass extending from the posterior aspect of the prostate gland into the anterior aspect of the distal sigmoid colon and rectum. This is not clearly identified on the prior study and is concerning for the presence of an underlying neoplastic process. Correlation with PSA values and physical examination is recommended. 2. Colonic diverticulosis with additional findings suggestive of colitis/diverticulitis involving the mid to distal sigmoid colon. 3. Bilateral simple renal cysts (Bosniak 1). No additional follow-up imaging is recommended. This recommendation follows ACR consensus guidelines: Management of the Incidental Renal Mass on CT: A White Paper of the ACR Incidental Findings Committee. J Am Coll Radiol [phone] 4. Small area of increased attenuation within the dependent portion of the bladder, as described above, which may represent a small bladder mass. Further evaluation with cystography is recommended. 5. Aortic atherosclerosis. Aortic Atherosclerosis (ICD10-I70.0) and Emphysema (ICD10-J43.9). Electronically Signed   By: TVirgina NorfolkM.D.   On: 11/16/2021 18:36   DG ABD ACUTE 2+V W 1V CHEST  Result Date: 11/16/2021 CLINICAL DATA:  Nausea, vomiting, abdominal pain EXAM: DG ABDOMEN ACUTE WITH 1 VIEW CHEST COMPARISON:  Previous studies including the CT enterography done on 11/07/2021 FINDINGS: Cardiac size is within normal limits. Possible coronary artery calcifications/stent are noted. Flattening of diaphragms and decreased bronchovascular markings  in both lungs suggest severe COPD. There are small linear densities in both apices, possibly suggesting scarring. There is no focal pulmonary consolidation or  signs of pulmonary edema. There is no pleural effusion or pneumothorax. Bowel gas pattern is nonspecific. Small amount of stool is seen in colon. No abnormal masses or calcifications are seen. Degenerative changes are noted in lumbar spine. IMPRESSION: There is no evidence of intestinal obstruction or pneumoperitoneum. No abnormal masses or calcifications are seen. Lumbar spondylosis. Severe COPD. Electronically Signed   By: Elmer Picker M.D.   On: 11/16/2021 15:17    Impression: Abnormal CT scan Suspected mass of the prostate Diverticulitis Abdominal pain, nausea, vomiting  Patient presenting for worsening abdominal pain, nausea, vomiting.  Patient notes abdominal pain nausea and vomiting is improved since presentation to the emergency department.  Patient was started on ceftriaxone 2 g daily and Flagyl 500 mg every 12 hours diverticulitis.  CT scan suspicious for possible prostate cancer.  Patient is not a candidate for sigmoidoscopy or colonoscopy at this time due to diverticulitis.    CT abdomen pelvis 10/5 1. Heterogeneous soft tissue mass extending from the posterior aspect of the prostate gland into the anterior aspect of the distal sigmoid colon and rectum. 2. Colonic diverticulosis with additional findings suggestive of colitis/diverticulitis involving the mid to distal sigmoid colon. 4. Small area of increased attenuation within the dependent portion of the bladder, as described above, which may represent a small bladder mass. Further evaluation with cystography is recommended.  Plan: Appreciate urology input for possible prostate cancer We will follow results of PSA and CEA Consider colonoscopy or sigmoidoscopy as outpatient after resolution of diverticulitis flare Continue IV antibiotics for diverticulitis. [facility] will follow  LOS: 1 day   Charlott Rakes  PA-C 11/17/2021, 10:44 AM  Contact #  [phone]

## 2021-11-17 NOTE — Progress Notes (Signed)
Verbal and written discharge instructions given to patient. Austin [name] verbalized understanding and stated he does not want colonoscopy but will follow up with GI. Patient walked to lobby for discharge home with wife. Wife transporting him home.

## 2021-11-17 NOTE — Progress Notes (Signed)
Patient seen and examined personally, I reviewed the chart, history and physical and admission note, done by admitting physician this morning and agree with the same with following addendum.  Please refer to the morning admission note for more detailed plan of care.  Briefly,  81-yom with chronic diastolic CHF, COPD, GERD, hypertension, HLD recent hospitalization 9/23 to 9/25 for small bowel obstruction resolved following enterscopy with Dr. Michail Sermon, presented with abdominal pain associated with nausea and vomiting, x3 to 4 days.  Following discharge progressively worsening.  In the ED hemodynamically stable, labs showed a stable.  Lites, renal function and LFTs and CBC but lactic acid is 2.5. CT abdomen/pelvis with contrast showing evidence acute diverticulitis w/o e/o obstruction, abscess, perforation. Also showed interval evidence of new intra-abdominal mass, concerning for malignancy in the setting of recent unintentional weight loss, and no reported prior history of malignancy. Patient was admitted for further management.  Lactic acidosis resolved with IV fluids.   She feels fine this am Had vomiting Sunday and Monday Patient otherwise denies any nausea, vomiting, chest pain, shortness of breath, fever, chills, headache, focal weakness, numbness tingling, speech difficulties  this am On exam mildy tender on LLQ aaox3.  Issues: Acute diverticulitis of mid to distal sigmoid colon: We will continue ceftriaxone Flagyl, IV fluids, clear liquid diet and advance as tolerated.  Consult GI.Marland Kitchen  Heterogeneous soft tissue mass extending from posterior aspect of the prostate gland into the anterior aspect of the distal sigmoid colon and rectum-course with PSA and physical exam advised concerning for underlying neoplastic process.  Ordered PSA, consulted Dr. Junious Silk from nephrology.  Lactic acidosis Dehydration: In the setting of diverticulitis nausea vomiting. Lactic acidosis resolved.  Continue IV  fluids for dehydration.    COPD stable continue home inhalers Chronic diastolic CHF-monitor fluid status Hypertension: Holding antihypertensives due to diverticulitis. Hyperlipidemia continue statin GERD continue PPI

## 2021-11-18 LAB — TESTOSTERONE: Testosterone: 387 ng/dL (ref 264–916)

## 2021-11-19 LAB — CEA: CEA: 5.1 ng/mL — ABNORMAL HIGH (ref 0.0–4.7)

## 2021-12-12 ENCOUNTER — Other Ambulatory Visit: Payer: Self-pay | Admitting: Physician Assistant

## 2021-12-14 MED ORDER — METOPROLOL SUCCINATE ER 25 MG PO TB24: 25.0000 mg | ORAL_TABLET | Freq: Every day | ORAL | 0 refills | Status: DC

## 2021-12-21 ENCOUNTER — Ambulatory Visit: Payer: Medicare (Managed Care) | Attending: Cardiovascular Disease | Admitting: Cardiovascular Disease

## 2021-12-21 ENCOUNTER — Encounter: Payer: Self-pay | Admitting: Cardiovascular Disease

## 2021-12-21 VITALS — BP 135/80 | HR 63 | Ht 70.0 in | Wt 118.0 lb

## 2021-12-21 DIAGNOSIS — I2510 Atherosclerotic heart disease of native coronary artery without angina pectoris: Secondary | ICD-10-CM | POA: Diagnosis not present

## 2021-12-21 DIAGNOSIS — I10 Essential (primary) hypertension: Secondary | ICD-10-CM | POA: Diagnosis not present

## 2021-12-21 DIAGNOSIS — E785 Hyperlipidemia, unspecified: Secondary | ICD-10-CM

## 2021-12-21 NOTE — Progress Notes (Signed)
Chief Complaint  Patient presents with   Follow-up    CAD    History of Present Illness: 81 yo male with history of CAD, HTN, HLD and COPD who is being seen today for cardiac follow up. 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 A Taxus DES was placed in the mid RCA. A Cypher DES was placed in OM1. A Taxus DES was placed in the proximal Circumflex. He has had no cardiac catheterization since 2005. Exercise stress test July 2018 at the [facility] without ischemia. We have discussed updating his echo but he does not wish to do this.   He is here today for follow up. The patient denies any chest pain, dyspnea, palpitations, lower extremity edema, orthopnea, PND, dizziness, near syncope or syncope. His only complaint today is pain in abdomen from recent diverticulitis. He has ongoing diarrhea and nausea and is seeing the [place] next week for f/u.    Primary Care Physician: [facility] [place]  Past Medical History:  Diagnosis Date   CAD (coronary artery disease)    COPD (chronic obstructive pulmonary disease) (HCC)    Dyspnea on exertion    GERD (gastroesophageal reflux disease)    Hyperlipidemia    MI (myocardial infarction) ([place])    Rectal bleeding    SBO (small bowel obstruction) ([place])    Sept '23   Tobacco abuse    Unspecified essential hypertension     Past Surgical History:  Procedure Laterality Date   CORONARY ANGIOPLASTY WITH STENT PLACEMENT     Drug eluting stent placed to the first obtuse marginal with a 2.58 mm cypher drug eluting stent. Circumflex was treated with 3.0 x 18 mm TAXUS drug-eluting stent on 07/17/03   ENTEROSCOPY N/A 11/07/2021   Procedure: ENTEROSCOPY;  Surgeon: Wilford Corner, MD;  Location: [place];  Service: Gastroenterology;  Laterality: N/A;   EYE SURGERY  1971   bilateral   PTCA     And drug-eluting stent to the RCA   RECTAL SURGERY  2008   dr. Rosana Hoes did this surgery    Current Outpatient Medications  Medication Sig Dispense  Refill   atorvastatin (LIPITOR) 40 MG tablet TAKE 1 TABLET AT BEDTIME 90 tablet 0   clopidogrel (PLAVIX) 75 MG tablet TAKE 1 TABLET DAILY 90 tablet 0   famotidine (PEPCID) 20 MG tablet Take 20 mg by mouth daily.     losartan (COZAAR) 25 MG tablet TAKE 1 TABLET DAILY 90 tablet 0   metoprolol succinate (TOPROL-XL) 25 MG 24 hr tablet Take 1 tablet (25 mg total) by mouth daily. 90 tablet 0   Multiple Vitamins-Minerals (MULTIVITAMIN WITH MINERALS) tablet Take 1 tablet by mouth daily.     nitroGLYCERIN (NITROSTAT) 0.4 MG SL tablet Place 1 tablet (0.4 mg total) under the tongue as directed. (Patient taking differently: Place 0.4 mg under the tongue every 5 (five) minutes as needed for chest pain.) 25 tablet 3   PROAIR HFA 108 (90 Base) MCG/ACT inhaler Inhale 1 puff into the lungs every 4 (four) hours as needed for shortness of breath.     SPIRIVA RESPIMAT 2.5 MCG/ACT AERS Inhale 2 puffs into the lungs daily.     SYMBICORT 160-4.5 MCG/ACT inhaler Inhale 2 puffs into the lungs daily.     triamcinolone cream (KENALOG) 0.1 % Apply 1 Application topically 2 (two) times daily.     No current facility-administered medications for this visit.    No Known Allergies  Social History   Socioeconomic  History   Marital status: Married    Spouse name: Not on file   Number of children: Not on file   Years of education: Not on file   Highest education level: Not on file  Occupational History   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  Vaping Use   Vaping Use: Never used  Substance and Sexual Activity   Alcohol use: Yes   Drug use: No   Sexual activity: Not on file  Other Topics Concern   Not on file  Social History Narrative   Lives in  [place] with his wife   [place] veteran   Exercises 3 times a week at the [employer] Determinants of Health   Financial Resource Strain: Not on file  Food Insecurity: No Food Insecurity (11/16/2021)   Hunger Vital Sign    Worried About Running Out of Food in the Last Year: Never true    Ran Out of Food in the Last Year: Never true  Transportation Needs: No Transportation Needs (11/16/2021)   PRAPARE - Hydrologist (Medical): No    Lack of Transportation (Non-Medical): No  Physical Activity: Not on file  Stress: Not on file  Social Connections: Not on file  Intimate Partner Violence: Not At Risk (11/16/2021)   Humiliation, Afraid, Rape, and Kick questionnaire    Fear of Current or Ex-Partner: No    Emotionally Abused: No    Physically Abused: No    Sexually Abused: No    Family History  Problem Relation Age of Onset   Cancer Mother        cervical   Lung disease Father        Black lung    Review of Systems:  As stated in the HPI and otherwise negative.   BP 135/80   Pulse 63   Ht '5\' 10"'$  (1.778 m)   Wt 118 lb (53.5 kg)   SpO2 98%   BMI 16.93 kg/m        Physical Examination: General: Well developed, well nourished, NAD  HEENT: OP clear, mucus membranes moist  SKIN: warm, dry. No rashes. Neuro: No focal deficits  Musculoskeletal: Muscle strength 5/5 all ext  Psychiatric: Mood and affect normal  Neck: No JVD, no carotid bruits, no thyromegaly, no lymphadenopathy.  Lungs:Clear bilaterally, no wheezes, rhonci, crackles Cardiovascular: Regular rate and rhythm. No murmurs, gallops or rubs. Abdomen:Soft. Bowel sounds present. Non-tender.  Extremities: No lower extremity edema. Pulses are 2 + in the bilateral DP/PT.  Echo 12/03/12: Left ventricle: The cavity size was normal. Wall thickness was normal. Systolic function was normal. The estimated ejection fraction was in the range of 50% to 55%. Wall motion was normal; there  were no regional wall motion abnormalities. Doppler parameters are consistent with abnormal left ventricular relaxation (grade 1 diastolic dysfunction). - Mitral valve: Calcified annulus.  EKG:  EKG is not ordered today. The ekg ordered today demonstrates   Recent Labs: 11/07/2021: TSH 1.180 11/17/2021: ALT 16; BUN 22; Creatinine, Ser 0.92; Hemoglobin 11.9; Magnesium 2.1; Platelets 245; Potassium 4.1; Sodium 140   Lipid Panel    Component Value Date/Time  CHOL 142 01/05/2014 0919   TRIG 150.0 (H) 01/05/2014 0919   HDL 32.80 (L) 01/05/2014 0919   CHOLHDL 4 01/05/2014 0919   VLDL 30.0 01/05/2014 0919   LDLCALC 79 01/05/2014 0919     Wt Readings from Last 3 Encounters:  12/21/21 118 lb (53.5 kg)  11/16/21 126 lb (57.2 kg)  11/07/21 126 lb 1.7 oz (57.2 kg)    Assessment and Plan:   1. CAD without angina: He does not have chest pain suggestive of angina. Will continue Plavix, beta blocker and statin.     2. HYPERTENSION: BP is elevated today but on recheck 135/80. No changes today     3. TOBACCO ABUSE: He no longer smokes  4. HYPERLIPIDEMIA: Lipids followed at the [facility] and controlled per pt. Will continue statin.     Labs/ tests ordered today include:  No orders of the defined types were placed in this encounter.  Disposition:   F/U with me in 12 months  Signed, Lauree Chandler, MD 12/21/2021 3:55 PM    [address], [place], [place]  [address] Phone: [phone]; Fax: [phone]

## 2021-12-21 NOTE — Patient Instructions (Signed)
Medication Instructions:  No changes *If you need a refill on your cardiac medications before your next appointment, please call your pharmacy*   Lab Work: noe If you have labs (blood work) drawn today and your tests are completely normal, you will receive your results only by: [place] (if you have MyChart) OR A paper copy in the mail If you have any lab test that is abnormal or we need to change your treatment, we will call you to review the results.   Testing/Procedures: none   Follow-Up: At [facility], you and your health needs are our priority.  As part of our continuing mission to provide you with exceptional heart care, we have created designated Provider Care Teams.  These Care Teams include your primary Cardiologist (physician) and Advanced Practice Providers (APPs -  Physician Assistants and Nurse Practitioners) who all work together to provide you with the care you need, when you need it.  We recommend signing up for the patient portal called "MyChart".  Sign up information is provided on this After Visit Summary.  MyChart is used to connect with patients for Virtual Visits (Telemedicine).  Patients are able to view lab/test results, encounter notes, upcoming appointments, etc.  Non-urgent messages can be sent to your provider as well.   To learn more about what you can do with MyChart, go to [email or web address].    Your next appointment:   12 month(s)  The format for your next appointment:   In Person  Provider:   Lauree Chandler, MD     Other Instructions   Important Information About Sugar

## 2022-02-24 ENCOUNTER — Other Ambulatory Visit: Payer: Self-pay | Admitting: Cardiovascular Disease

## 2022-03-13 ENCOUNTER — Other Ambulatory Visit: Payer: Self-pay | Admitting: Cardiovascular Disease

## 2022-06-26 ENCOUNTER — Emergency Department (HOSPITAL_COMMUNITY): Payer: No Typology Code available for payment source

## 2022-06-26 ENCOUNTER — Encounter (HOSPITAL_COMMUNITY): Payer: Self-pay

## 2022-06-26 ENCOUNTER — Other Ambulatory Visit: Payer: Self-pay

## 2022-06-26 ENCOUNTER — Inpatient Hospital Stay (HOSPITAL_COMMUNITY)
Admission: EM | Admit: 2022-06-26 | Discharge: 2022-07-04 | DRG: 329 | Disposition: A | Discharge status: Home / Self Care | Payer: No Typology Code available for payment source | Attending: Internal Medicine | Admitting: Internal Medicine

## 2022-06-26 DIAGNOSIS — Z7951 Long term (current) use of inhaled steroids: Secondary | ICD-10-CM

## 2022-06-26 DIAGNOSIS — Z5329 Procedure and treatment not carried out because of patient's decision for other reasons: Secondary | ICD-10-CM | POA: Diagnosis present

## 2022-06-26 DIAGNOSIS — C180 Malignant neoplasm of cecum: Secondary | ICD-10-CM | POA: Diagnosis present

## 2022-06-26 DIAGNOSIS — E43 Unspecified severe protein-calorie malnutrition: Secondary | ICD-10-CM | POA: Diagnosis present

## 2022-06-26 DIAGNOSIS — Z955 Presence of coronary angioplasty implant and graft: Secondary | ICD-10-CM

## 2022-06-26 DIAGNOSIS — Z85828 Personal history of other malignant neoplasm of skin: Secondary | ICD-10-CM

## 2022-06-26 DIAGNOSIS — K219 Gastro-esophageal reflux disease without esophagitis: Secondary | ICD-10-CM | POA: Diagnosis present

## 2022-06-26 DIAGNOSIS — K449 Diaphragmatic hernia without obstruction or gangrene: Secondary | ICD-10-CM | POA: Diagnosis present

## 2022-06-26 DIAGNOSIS — I110 Hypertensive heart disease with heart failure: Secondary | ICD-10-CM | POA: Diagnosis present

## 2022-06-26 DIAGNOSIS — J449 Chronic obstructive pulmonary disease, unspecified: Secondary | ICD-10-CM | POA: Diagnosis present

## 2022-06-26 DIAGNOSIS — J439 Emphysema, unspecified: Secondary | ICD-10-CM | POA: Diagnosis not present

## 2022-06-26 DIAGNOSIS — I5032 Chronic diastolic (congestive) heart failure: Secondary | ICD-10-CM | POA: Diagnosis present

## 2022-06-26 DIAGNOSIS — K56699 Other intestinal obstruction unspecified as to partial versus complete obstruction: Principal | ICD-10-CM | POA: Diagnosis present

## 2022-06-26 DIAGNOSIS — I2510 Atherosclerotic heart disease of native coronary artery without angina pectoris: Secondary | ICD-10-CM | POA: Diagnosis present

## 2022-06-26 DIAGNOSIS — E782 Mixed hyperlipidemia: Secondary | ICD-10-CM

## 2022-06-26 DIAGNOSIS — N329 Bladder disorder, unspecified: Secondary | ICD-10-CM | POA: Diagnosis present

## 2022-06-26 DIAGNOSIS — Z87891 Personal history of nicotine dependence: Secondary | ICD-10-CM

## 2022-06-26 DIAGNOSIS — I252 Old myocardial infarction: Secondary | ICD-10-CM

## 2022-06-26 DIAGNOSIS — I10 Essential (primary) hypertension: Secondary | ICD-10-CM | POA: Diagnosis present

## 2022-06-26 DIAGNOSIS — C179 Malignant neoplasm of small intestine, unspecified: Secondary | ICD-10-CM | POA: Diagnosis present

## 2022-06-26 DIAGNOSIS — R634 Abnormal weight loss: Secondary | ICD-10-CM | POA: Diagnosis present

## 2022-06-26 DIAGNOSIS — Z681 Body mass index (BMI) 19 or less, adult: Secondary | ICD-10-CM

## 2022-06-26 DIAGNOSIS — Z7902 Long term (current) use of antithrombotics/antiplatelets: Secondary | ICD-10-CM

## 2022-06-26 DIAGNOSIS — C786 Secondary malignant neoplasm of retroperitoneum and peritoneum: Secondary | ICD-10-CM | POA: Diagnosis present

## 2022-06-26 DIAGNOSIS — Z79899 Other long term (current) drug therapy: Secondary | ICD-10-CM

## 2022-06-26 DIAGNOSIS — K222 Esophageal obstruction: Secondary | ICD-10-CM | POA: Diagnosis present

## 2022-06-26 DIAGNOSIS — E785 Hyperlipidemia, unspecified: Secondary | ICD-10-CM | POA: Diagnosis present

## 2022-06-26 DIAGNOSIS — Z86010 Personal history of colonic polyps: Secondary | ICD-10-CM

## 2022-06-26 DIAGNOSIS — K56609 Unspecified intestinal obstruction, unspecified as to partial versus complete obstruction: Secondary | ICD-10-CM | POA: Diagnosis present

## 2022-06-26 DIAGNOSIS — Z5331 Laparoscopic surgical procedure converted to open procedure: Secondary | ICD-10-CM

## 2022-06-26 LAB — CBC WITH DIFFERENTIAL/PLATELET
Abs Immature Granulocytes: 0.05 10*3/uL (ref 0.00–0.07)
Basophils Absolute: 0 10*3/uL (ref 0.0–0.1)
Basophils Relative: 0 %
Eosinophils Absolute: 0.1 10*3/uL (ref 0.0–0.5)
Eosinophils Relative: 1 %
HCT: 43.3 % (ref 39.0–52.0)
Hemoglobin: 14.2 g/dL (ref 13.0–17.0)
Immature Granulocytes: 1 %
Lymphocytes Relative: 12 %
Lymphs Abs: 1.3 10*3/uL (ref 0.7–4.0)
MCH: 30.7 pg (ref 26.0–34.0)
MCHC: 32.8 g/dL (ref 30.0–36.0)
MCV: 93.7 fL (ref 80.0–100.0)
Monocytes Absolute: 1 10*3/uL (ref 0.1–1.0)
Monocytes Relative: 9 %
Neutro Abs: 8.1 10*3/uL — ABNORMAL HIGH (ref 1.7–7.7)
Neutrophils Relative %: 77 %
Platelets: 312 10*3/uL (ref 150–400)
RBC: 4.62 MIL/uL (ref 4.22–5.81)
RDW: 13.2 % (ref 11.5–15.5)
WBC: 10.5 10*3/uL (ref 4.0–10.5)
nRBC: 0 % (ref 0.0–0.2)

## 2022-06-26 LAB — COMPREHENSIVE METABOLIC PANEL
ALT: 16 U/L (ref 0–44)
AST: 18 U/L (ref 15–41)
Albumin: 4 g/dL (ref 3.5–5.0)
Alkaline Phosphatase: 77 U/L (ref 38–126)
Anion gap: 9 (ref 5–15)
BUN: 19 mg/dL (ref 8–23)
CO2: 30 mmol/L (ref 22–32)
Calcium: 9.3 mg/dL (ref 8.9–10.3)
Chloride: 96 mmol/L — ABNORMAL LOW (ref 98–111)
Creatinine, Ser: 1.06 mg/dL (ref 0.61–1.24)
GFR, Estimated: >60 mL/min (ref >60)
Glucose, Bld: 120 mg/dL — ABNORMAL HIGH (ref 70–99)
Potassium: 4.3 mmol/L (ref 3.5–5.1)
Sodium: 135 mmol/L (ref 135–145)
Total Bilirubin: 0.6 mg/dL (ref 0.3–1.2)
Total Protein: 6.8 g/dL (ref 6.5–8.1)

## 2022-06-26 LAB — LIPASE, BLOOD: Lipase: 32 U/L (ref 11–51)

## 2022-06-26 MED ORDER — ACETAMINOPHEN 325 MG PO TABS: 650.0000 mg | ORAL_TABLET | Freq: Four times a day (QID) | ORAL | Status: DC | PRN

## 2022-06-26 MED ORDER — KETOROLAC TROMETHAMINE 15 MG/ML IJ SOLN: 7.5000 mg | Freq: Four times a day (QID) | INTRAMUSCULAR | Status: AC | PRN

## 2022-06-26 MED ORDER — MORPHINE SULFATE (PF) 4 MG/ML IV SOLN
4.0000 mg | Freq: Once | INTRAVENOUS | Status: AC
  Administered 2022-06-26: 4 mg via INTRAVENOUS
  Filled 2022-06-26: qty 1

## 2022-06-26 MED ORDER — SODIUM CHLORIDE 0.9% FLUSH
3.0000 mL | Freq: Two times a day (BID) | INTRAVENOUS | Status: DC
  Administered 2022-06-26 – 2022-07-04 (×14): 3 mL via INTRAVENOUS

## 2022-06-26 MED ORDER — ACETAMINOPHEN 650 MG RE SUPP: 650.0000 mg | Freq: Four times a day (QID) | RECTAL | Status: DC | PRN

## 2022-06-26 MED ORDER — LACTATED RINGERS IV BOLUS
1000.0000 mL | Freq: Once | INTRAVENOUS | Status: AC
  Administered 2022-06-26: 1000 mL via INTRAVENOUS

## 2022-06-26 MED ORDER — SODIUM CHLORIDE 0.9 % IV SOLN: INTRAVENOUS | Status: AC

## 2022-06-26 MED ORDER — DIATRIZOATE MEGLUMINE & SODIUM 66-10 % PO SOLN
90.0000 mL | Freq: Once | ORAL | Status: AC
  Administered 2022-06-26: 90 mL via ORAL
  Filled 2022-06-26 (×2): qty 90

## 2022-06-26 MED ORDER — ONDANSETRON HCL 4 MG/2ML IJ SOLN
4.0000 mg | Freq: Once | INTRAMUSCULAR | Status: AC
  Administered 2022-06-26: 4 mg via INTRAVENOUS
  Filled 2022-06-26: qty 2

## 2022-06-26 MED ORDER — PROCHLORPERAZINE EDISYLATE 10 MG/2ML IJ SOLN
5.0000 mg | Freq: Four times a day (QID) | INTRAMUSCULAR | Status: AC | PRN
  Administered 2022-06-26 – 2022-07-01 (×2): 5 mg via INTRAVENOUS
  Filled 2022-06-26 (×2): qty 2

## 2022-06-26 NOTE — ED Provider Triage Note (Signed)
Emergency Medicine Provider Triage Evaluation Note  Austin [name] , a 82 y.o. male  was evaluated in triage.  Pt complains of abdominal pain, diarrhea, vomiting.  Patient reports prior history of diverticulitis.  States that he was diagnosed with diverticulitis in October but has not been treated in any way.  Reports that foods frequently reaggravated his stomach and cause him to have symptoms.  Reports he has not been able to keep any liquid or food down since Saturday after eating at [employer].  Denies any blood in his vomit or stool.  Currently takes Plavix.  Review of Systems  Positive: As above Negative: As above  Physical Exam  BP (!) 171/96 (BP Location: Right Arm)   Pulse 76   Temp 98.2 F (36.8 C)   Resp 18   Ht 5\' 10"  (1.778 m)   Wt 48.5 kg   SpO2 98%   BMI 15.35 kg/m  Gen:   Awake, no distress   Resp:  Normal effort  MSK:   Moves extremities without difficulty  Other:  Tenderness to palpation left lower quadrant.  Increased bowel sounds.  Medical Decision Making  Medically screening exam initiated at 12:56 PM.  Appropriate orders placed.  Austin [name] was informed that the remainder of the evaluation will be completed by another provider, this initial triage assessment does not replace that evaluation, and the importance of remaining in the ED until their evaluation is complete.     Smitty Knudsen, PA-C 06/26/22 1257

## 2022-06-26 NOTE — ED Triage Notes (Signed)
Pt reports left sided abd pain since eating [employer] Saturday. Pt has hx of diverticulitis but does not currently have a GI doctor. Pt states this has been ongoing since October and he has lost about 60lb since then. Pt reports multiple episodes of emesis and diarrhea since Saturday. Denies any blood in his stools.

## 2022-06-26 NOTE — Progress Notes (Signed)
Pt vomiting, no antiemetics available. Provider contacted via secure chat. New PRN ordered/given.

## 2022-06-26 NOTE — ED Notes (Signed)
ED TO INPATIENT HANDOFF REPORT  ED Nurse Name and Phone #:Cori [phone]  S Name/Age/Gender Austin [name] 82 y.o. male Room/Bed: 038C/038C  Code Status   Code Status: Full Code  Home/SNF/Other Home Patient oriented to: self, place, time, and situation Is this baseline? No   Triage Complete: Triage complete  Chief Complaint SBO (small bowel obstruction) (HCC) [K56.609]  Triage Note Pt reports left sided abd pain since eating [employer] Saturday. Pt has hx of diverticulitis but does not currently have a GI doctor. Pt states this has been ongoing since October and he has lost about 60lb since then. Pt reports multiple episodes of emesis and diarrhea since Saturday. Denies any blood in his stools.   Allergies No Known Allergies  Level of Care/Admitting Diagnosis ED Disposition     ED Disposition  Admit   Condition  --   Comment  Hospital Area: [facility] [[id number]]  Level of Care: Telemetry Surgical [105]  May place patient in observation at [facility] or [facility] if equivalent level of care is available:: No  Covid Evaluation: Asymptomatic - no recent exposure (last 10 days) testing not required  Diagnosis: SBO (small bowel obstruction) [facility]) [[id number]]  Admitting Physician: Synetta Fail [[id number]]  Attending Physician: Synetta Fail [[id number]]          B Medical/Surgery History Past Medical History:  Diagnosis Date   CAD (coronary artery disease)    COPD (chronic obstructive pulmonary disease) (HCC)    Dyspnea on exertion    GERD (gastroesophageal reflux disease)    Hyperlipidemia    MI (myocardial infarction) (HCC)    Rectal bleeding    SBO (small bowel obstruction) (HCC)    Sept '23   Tobacco abuse    Unspecified essential hypertension    Past Surgical History:  Procedure Laterality Date   CORONARY ANGIOPLASTY WITH STENT PLACEMENT     Drug eluting stent placed to the first obtuse marginal with a 2.58 mm cypher drug eluting  stent. Circumflex was treated with 3.0 x 18 mm TAXUS drug-eluting stent on 07/17/03   ENTEROSCOPY N/A 11/07/2021   Procedure: ENTEROSCOPY;  Surgeon: Charlott Rakes, MD;  Location[facility] ENDOSCOPY;  Service: Gastroenterology;  Laterality: N/A;   EYE SURGERY  1971   bilateral   PTCA     And drug-eluting stent to the RCA   RECTAL SURGERY  2008   [name] did this surgery     A IV Location/Drains/Wounds Patient Lines/Drains/Airways Status     Active Line/Drains/Airways     Name Placement date Placement time Site Days   Peripheral IV 06/26/22 20 G Right Antecubital 06/26/22  1400  Antecubital  less than 1            Intake/Output Last 24 hours  Intake/Output Summary (Last 24 hours) at 06/26/2022 1558 Last data filed at 06/26/2022 1514 Gross per 24 hour  Intake 1000 ml  Output --  Net 1000 ml    Labs/Imaging Results for orders placed or performed during the hospital encounter of 06/26/22 (from the past 48 hour(s))  Comprehensive metabolic panel     Status: Abnormal   Collection Time: 06/26/22 12:59 PM  Result Value Ref Range   Sodium 135 135 - 145 mmol/L   Potassium 4.3 3.5 - 5.1 mmol/L   Chloride 96 (L) 98 - 111 mmol/L   CO2 30 22 - 32 mmol/L   Glucose, Bld 120 (H) 70 - 99 mg/dL    Comment: Glucose reference  range applies only to samples taken after fasting for at least 8 hours.   BUN 19 8 - 23 mg/dL   Creatinine, Ser 4.09 0.61 - 1.24 mg/dL   Calcium 9.3 8.9 - 81.1 mg/dL   Total Protein 6.8 6.5 - 8.1 g/dL   Albumin 4.0 3.5 - 5.0 g/dL   AST 18 15 - 41 U/L   ALT 16 0 - 44 U/L   Alkaline Phosphatase 77 38 - 126 U/L   Total Bilirubin 0.6 0.3 - 1.2 mg/dL   GFR, Estimated >91 >47 mL/min    Comment: (NOTE) Calculated using the CKD-EPI Creatinine Equation (2021)    Anion gap 9 5 - 15    Comment: Performed at [facility], [address] [place], [place] [address]  CBC with Differential     Status: Abnormal   Collection Time: 06/26/22 12:59 PM  Result Value Ref  Range   WBC 10.5 4.0 - 10.5 K/uL   RBC 4.62 4.22 - 5.81 MIL/uL   Hemoglobin 14.2 13.0 - 17.0 g/dL   HCT 21.3 08.6 - 57.8 %   MCV 93.7 80.0 - 100.0 fL   MCH 30.7 26.0 - 34.0 pg   MCHC 32.8 30.0 - 36.0 g/dL   RDW 46.9 62.9 - 52.8 %   Platelets 312 150 - 400 K/uL   nRBC 0.0 0.0 - 0.2 %   Neutrophils Relative % 77 %   Neutro Abs 8.1 (H) 1.7 - 7.7 K/uL   Lymphocytes Relative 12 %   Lymphs Abs 1.3 0.7 - 4.0 K/uL   Monocytes Relative 9 %   Monocytes Absolute 1.0 0.1 - 1.0 K/uL   Eosinophils Relative 1 %   Eosinophils Absolute 0.1 0.0 - 0.5 K/uL   Basophils Relative 0 %   Basophils Absolute 0.0 0.0 - 0.1 K/uL   Immature Granulocytes 1 %   Abs Immature Granulocytes 0.05 0.00 - 0.07 K/uL    Comment: Performed at [facility], [address], [place], [place] [address]  Lipase, blood     Status: None   Collection Time: 06/26/22 12:59 PM  Result Value Ref Range   Lipase 32 11 - 51 U/L    Comment: Performed at [facility], [address], [place], [place] [address]   CT ABDOMEN PELVIS WO CONTRAST  Result Date: 06/26/2022 CLINICAL DATA:  Left lower quadrant abdominal pain EXAM: CT ABDOMEN AND PELVIS WITHOUT CONTRAST TECHNIQUE: Multidetector CT imaging of the abdomen and pelvis was performed following the standard protocol without IV contrast. RADIATION DOSE REDUCTION: This exam was performed according to the departmental dose-optimization program which includes automated exposure control, adjustment of the mA and/or kV according to patient size and/or use of iterative reconstruction technique. COMPARISON:  11/16/2021 FINDINGS: Lower chest: Advanced changes of emphysema. No pleural fluid or airspace disease. Several irregular nodular densities are identified within the right lower lobe. The largest measures 0.8 cm in appears new from previous exam, image 13/5. Hepatobiliary: Posterior right lobe of liver cyst appears unchanged measuring 9 mm. No suspicious liver abnormality. Gallbladder  appears within normal limits. No bile duct dilatation. Pancreas: Unremarkable. No pancreatic ductal dilatation or surrounding inflammatory changes. Spleen: Normal in size without focal abnormality. Adrenals/Urinary Tract: Normal adrenal glands. No nephrolithiasis or hydronephrosis. Simple right kidney cyst is unchanged measuring 1.9 cm, image 20/2. No follow-up imaging recommended. The urinary bladder contains multiple tiny layering hyperdensities within the dependent portion of the bladder which appear increased from the previous exam  and may represent debris and/or stones. Stomach/Bowel: Stomach appears normal. There are several loops of abnormal dilated small bowel within the central abdomen with air-fluid levels. These measure up to 4.7 cm. Transition to decreased caliber small bowel noted within the lower abdomen and pelvis, image 53/3. The distal small bowel loops are decreased in caliber. No pathologic dilatation of the colon. Extensive sigmoid diverticulosis without signs of acute diverticulitis. Vascular/Lymphatic: Aortic atherosclerosis. No aneurysm. No abdominopelvic adenopathy identified. Reproductive: Spondylosis identified within the lumbar spine. Other: The S no free fluid or fluid collections. No signs of pneumoperitoneum. Musculoskeletal: No acute or significant osseous findings. IMPRESSION: 1. Examination is positive for small bowel obstruction with transition to decreased caliber small bowel within the lower abdomen/ pelvis. 2. Sigmoid diverticulosis without signs of acute diverticulitis. 3. Multiple tiny layering hyperdensities within the dependent portion of the bladder may represent debris and/or stones. These appear increased when compared with the previous exam. 4. Several irregular nodular densities are identified within the right lower lobe. The largest measures 0.8 cm in appears new from previous exam. Per Fleischner Society Guidelines, recommend a non-contrast Chest CT at 3-6 months, then  another non-contrast Chest CT at 18-24 months. 5.  Aortic Atherosclerosis (ICD10-I70.0). Electronically Signed   By: Signa Kell M.D.   On: 06/26/2022 13:26    Pending Labs Unresulted Labs (From admission, onward)     Start     Ordered   06/27/22 0500  Comprehensive metabolic panel  Tomorrow morning,   R        06/26/22 1532   06/27/22 0500  CBC  Tomorrow morning,   R        06/26/22 1532            Vitals/Pain Today's Vitals   06/26/22 1233 06/26/22 1233 06/26/22 1415 06/26/22 1501  BP:  (!) 171/96    Pulse:  76    Resp:  18    Temp:  98.2 F (36.8 C)    SpO2:  98%    Weight: 48.5 kg     Height: 5\' 10"  (1.778 m)     PainSc:   9  Asleep    Isolation Precautions No active isolations  Medications Medications  sodium chloride flush (NS) 0.9 % injection 3 mL (has no administration in time range)  0.9 %  sodium chloride infusion (has no administration in time range)  acetaminophen (TYLENOL) tablet 650 mg (has no administration in time range)    Or  acetaminophen (TYLENOL) suppository 650 mg (has no administration in time range)  ketorolac (TORADOL) 15 MG/ML injection 7.5 mg (has no administration in time range)  lactated ringers bolus 1,000 mL (0 mLs Intravenous Stopped 06/26/22 1514)  ondansetron (ZOFRAN) injection 4 mg (4 mg Intravenous Given 06/26/22 1408)  morphine (PF) 4 MG/ML injection 4 mg (4 mg Intravenous Given 06/26/22 1414)    Mobility walks     Focused Assessments Renal Assessment Handoff:  Hemodialysis Schedule:  Last Hemodialysis date and time:    Restricted appendage    R Recommendations: See Admitting Provider Note  Report given to:   Additional Notes:  Patient is an 82 year old male with a history of CAD, hypertension, COPD, SBO and report of diverticulitis who is presenting today due to abdominal pain. Abdominal pain started on Saturday just left of his umbilicus and reports it feels like a knife is stabbing him in the stomach. It has  been persistent with recurrent vomiting and some diarrhea. He has not been able to  eat or drink anything. Last episode of emesis was at 8 AM this morning. He denies any fever, blood in stool or emesis. Last colonoscopy was last year and it showed 1 polyp and evidence of diverticulosis but no signs of cancer. Patient has not taken any medication at home for symptoms.  CT shows bowel obstruction. Patient alert and oriented

## 2022-06-26 NOTE — ED Provider Notes (Signed)
[place] EMERGENCY DEPARTMENT AT [facility] Provider Note   CSN: [id number] Arrival date & time: 06/26/22  1219     History  Chief Complaint  Patient presents with   Abdominal Pain   Diarrhea   Emesis    NICOLES [name] is a 82 y.o. male.  Patient is an 82 year old male with a history of CAD, hypertension, COPD, SBO and report of diverticulitis who is presenting today due to abdominal pain.  Abdominal pain started on Saturday just left of his umbilicus and reports it feels like a knife is stabbing him in the stomach.  It has been persistent with recurrent vomiting and some diarrhea.  He has not been able to eat or drink anything.  Last episode of emesis was at 8 AM this morning.  He denies any fever, blood in stool or emesis.  Last colonoscopy was last year and it showed 1 polyp and evidence of diverticulosis but no signs of cancer.  Patient has not taken any medication at home for symptoms.  The history is provided by the patient and medical records.  Abdominal Pain Associated symptoms: diarrhea and vomiting   Diarrhea Associated symptoms: abdominal pain and vomiting   Emesis Associated symptoms: abdominal pain and diarrhea        Home Medications Prior to Admission medications   Medication Sig Start Date End Date Taking? Authorizing Provider  atorvastatin (LIPITOR) 40 MG tablet TAKE 1 TABLET AT BEDTIME (MUST KEEP UPCOMING APPOINTMENT IN NOVEMBER 2023 WITH DR. Clifton James BEFORE ANY MORE REFILLS) 03/13/22   Kathleene Hazel, MD  clopidogrel (PLAVIX) 75 MG tablet TAKE 1 TABLET DAILY (MUST KEEP UPCOMING APPOINTMENT IN NOVEMBER 2023 WITH DR. Clifton James BEFORE ANY MORE REFILLS) 03/13/22   Kathleene Hazel, MD  famotidine (PEPCID) 20 MG tablet Take 20 mg by mouth daily.    [provider]  losartan (COZAAR) 25 MG tablet TAKE 1 TABLET DAILY (MUST KEEP UPCOMING APPOINTMENT IN NOVEMBER 2023 WITH DR. Clifton James BEFORE ANY MORE REFILLS) 03/13/22   Kathleene Hazel, MD  metoprolol succinate (TOPROL-XL) 25 MG 24 hr tablet Take 1 tablet (25 mg total) by mouth daily. 02/24/22   Kathleene Hazel, MD  Multiple Vitamins-Minerals (MULTIVITAMIN WITH MINERALS) tablet Take 1 tablet by mouth daily.    [provider]  nitroGLYCERIN (NITROSTAT) 0.4 MG SL tablet Place 1 tablet (0.4 mg total) under the tongue as directed. Patient taking differently: Place 0.4 mg under the tongue every 5 (five) minutes as needed for chest pain. 11/10/19   Kathleene Hazel, MD  PROAIR HFA [phone] Base) MCG/ACT inhaler Inhale 1 puff into the lungs every 4 (four) hours as needed for shortness of breath. 01/22/16   [provider]  SPIRIVA RESPIMAT 2.5 MCG/ACT AERS Inhale 2 puffs into the lungs daily. 12/23/18   [provider]  SYMBICORT 160-4.5 MCG/ACT inhaler Inhale 2 puffs into the lungs daily. 12/23/18   [provider]  triamcinolone cream (KENALOG) 0.1 % Apply 1 Application topically 2 (two) times daily. 11/04/20   [provider]      Allergies    Patient has no known allergies.    Review of Systems   Review of Systems  Gastrointestinal:  Positive for abdominal pain, diarrhea and vomiting.    Physical Exam Updated Vital Signs BP (!) 171/96 (BP Location: Right Arm)   Pulse 76   Temp 98.2 F (36.8 C)   Resp 18   Ht 5\' 10"  (1.778 m)  Wt 48.5 kg   SpO2 98%   BMI 15.35 kg/m  Physical Exam Vitals and nursing note reviewed.  Constitutional:      General: He is not in acute distress.    Appearance: He is well-developed.  HENT:     Head: Normocephalic and atraumatic.  Eyes:     Conjunctiva/sclera: Conjunctivae normal.     Pupils: Pupils are equal, round, and reactive to light.  Cardiovascular:     Rate and Rhythm: Normal rate and regular rhythm.     Heart sounds: No murmur heard. Pulmonary:     Effort: Pulmonary effort is normal. No respiratory distress.     Breath sounds: Normal breath sounds. No  wheezing or rales.  Abdominal:     General: Bowel sounds are decreased. There is no distension.     Palpations: Abdomen is soft.     Tenderness: There is abdominal tenderness in the periumbilical area. There is guarding. There is no rebound.  Musculoskeletal:        General: No tenderness. Normal range of motion.     Cervical back: Normal range of motion and neck supple.  Skin:    General: Skin is warm and dry.     Findings: No erythema or rash.  Neurological:     Mental Status: He is alert and oriented to person, place, and time.  Psychiatric:        Behavior: Behavior normal.     ED Results / Procedures / Treatments   Labs (all labs ordered are listed, but only abnormal results are displayed) Labs Reviewed  COMPREHENSIVE METABOLIC PANEL - Abnormal; Notable for the following components:      Result Value   Chloride 96 (*)    Glucose, Bld 120 (*)    All other components within normal limits  CBC WITH DIFFERENTIAL/PLATELET - Abnormal; Notable for the following components:   Neutro Abs 8.1 (*)    All other components within normal limits  LIPASE, BLOOD    EKG None  Radiology CT ABDOMEN PELVIS WO CONTRAST  Result Date: 06/26/2022 CLINICAL DATA:  Left lower quadrant abdominal pain EXAM: CT ABDOMEN AND PELVIS WITHOUT CONTRAST TECHNIQUE: Multidetector CT imaging of the abdomen and pelvis was performed following the standard protocol without IV contrast. RADIATION DOSE REDUCTION: This exam was performed according to the departmental dose-optimization program which includes automated exposure control, adjustment of the mA and/or kV according to patient size and/or use of iterative reconstruction technique. COMPARISON:  11/16/2021 FINDINGS: Lower chest: Advanced changes of emphysema. No pleural fluid or airspace disease. Several irregular nodular densities are identified within the right lower lobe. The largest measures 0.8 cm in appears new from previous exam, image 13/5.  Hepatobiliary: Posterior right lobe of liver cyst appears unchanged measuring 9 mm. No suspicious liver abnormality. Gallbladder appears within normal limits. No bile duct dilatation. Pancreas: Unremarkable. No pancreatic ductal dilatation or surrounding inflammatory changes. Spleen: Normal in size without focal abnormality. Adrenals/Urinary Tract: Normal adrenal glands. No nephrolithiasis or hydronephrosis. Simple right kidney cyst is unchanged measuring 1.9 cm, image 20/2. No follow-up imaging recommended. The urinary bladder contains multiple tiny layering hyperdensities within the dependent portion of the bladder which appear increased from the previous exam and may represent debris and/or stones. Stomach/Bowel: Stomach appears normal. There are several loops of abnormal dilated small bowel within the central abdomen with air-fluid levels. These measure up to 4.7 cm. Transition to decreased caliber small bowel noted within the lower abdomen and pelvis, image 53/3. The distal  small bowel loops are decreased in caliber. No pathologic dilatation of the colon. Extensive sigmoid diverticulosis without signs of acute diverticulitis. Vascular/Lymphatic: Aortic atherosclerosis. No aneurysm. No abdominopelvic adenopathy identified. Reproductive: Spondylosis identified within the lumbar spine. Other: The S no free fluid or fluid collections. No signs of pneumoperitoneum. Musculoskeletal: No acute or significant osseous findings. IMPRESSION: 1. Examination is positive for small bowel obstruction with transition to decreased caliber small bowel within the lower abdomen/ pelvis. 2. Sigmoid diverticulosis without signs of acute diverticulitis. 3. Multiple tiny layering hyperdensities within the dependent portion of the bladder may represent debris and/or stones. These appear increased when compared with the previous exam. 4. Several irregular nodular densities are identified within the right lower lobe. The largest measures  0.8 cm in appears new from previous exam. Per Fleischner Society Guidelines, recommend a non-contrast Chest CT at 3-6 months, then another non-contrast Chest CT at 18-24 months. 5.  Aortic Atherosclerosis (ICD10-I70.0). Electronically Signed   By: Signa Kell M.D.   On: 06/26/2022 13:26    Procedures Procedures    Medications Ordered in ED Medications  lactated ringers bolus 1,000 mL (1,000 mLs Intravenous New Bag/Given 06/26/22 1414)  ondansetron (ZOFRAN) injection 4 mg (4 mg Intravenous Given 06/26/22 1408)  morphine (PF) 4 MG/ML injection 4 mg (4 mg Intravenous Given 06/26/22 1414)    ED Course/ Medical Decision Making/ A&P                             Medical Decision Making Amount and/or Complexity of Data Reviewed External Data Reviewed: notes. Labs: ordered. Decision-making details documented in ED Course. Radiology: ordered and independent interpretation performed. Decision-making details documented in ED Course.  Risk Prescription drug management. Parenteral controlled substances. Decision regarding hospitalization.   Pt with multiple medical problems and comorbidities and presenting today with a complaint that caries a high risk for morbidity and mortality.  Here today with complaint of abdominal pain and vomiting.  Symptoms starte 3 days prior to arrival and been persistent.  Similar to when he was in the hospital in September.  In September patient was hospitalized for small bowel obstruction which resolved with supportive care.  Patient reports having a colonoscopy after that not finding any significant findings except for a polyp.  No findings of cancer reported by his wife.  Patient has had intermittent issues with abdominal pain since that time but this is the first time the symptoms have started again.  He has not had fever or blood in his stools.  Patient does have pain on exam but not significant distention.  Concern for diverticulitis, perforation, abscess, recurrent  small bowel obstruction.  Also possibility for kidney stone.  I independently interpreted patient's labs and CBC without acute findings, CMP and lipase I have independently visualized and interpreted pt's images today.  CT abdomen pelvis without evidence of perforation, renal stones but radiology reports patient has a small bowel obstruction with transition to the decreased caliber small bowel within the lower abdomen and pelvis.  No evidence of diverticulitis.  Also multiple tiny layering hyperdensities in the bladder that could be debris or stones increased from prior exams.  Patient given IV fluids pain and nausea control.  Will consult general surgery.  Will admit to medicine for further care.  Findings discussed with the patient and his wife.  He is comfortable with this plan.        Final Clinical Impression(s) / ED Diagnoses Final diagnoses:  Small bowel obstruction [facility])    Rx / DC Orders ED Discharge Orders     None         Gwyneth Sprout, MD 06/26/22 1449

## 2022-06-26 NOTE — H&P (Signed)
History and Physical   Austin [name] ZOX:[id number] DOB: Jul 15, 1940 DOA: 06/26/2022  PCP: [facility]   Patient coming from: Home  Chief Complaint: Abdominal pain, nausea, vomiting, diarrhea  HPI: Austin [name] is a 82 y.o. male with medical history significant of SBO, hyperlipidemia, GERD, hypertension, COPD, diastolic CHF, CAD presenting with abdominal pain as above.  Patient reporting 2 days of abdominal pain with associated nausea vomiting and diarrhea.  Pain is located just to the left of his umbilicus and described as a stabbing sensation that has been persistent.  He has had decreased p.o. intake.  Denies any fevers or bloody stools.    His his somewhat similar to his SBO that he present for last year.  Of note, at that time he initially presented for small bowel obstruction which improved after 2 or 3 days and he was discharged.  He had unremarkable endoscopy with push enteroscopy.  He subsequently was readmitted a week later and admitted with CT concerning for heterogeneous soft tissue mass extending from the prostate gland to the anterior aspect of the distal sigmoid colon and rectum.  PSA was not elevated and there is plan for possible follow-up with urology but I do not see that this has been done.  He had a follow-up colonoscopy which showed diverticulosis and subcentimeter polyp in the transverse colon that was removed with a hot snare.  Repeat CT scan here does not show mass so unclear if this is actually present.  Denies fevers, chills, chest pain, shortness of breath.  ED Course: Vitals in the ED notable for blood pressure in the 170 systolic.  Lab workup included CMP with chloride 96, glucose 120.  CBC within normal notes.  Lipase 32.  CT abdomen pelvis showed small bowel obstruction with transition point.  Also noted was diverticulosis without diverticulitis, hyperdensities in the bladder representing debris versus stones, right lower lobe nodules recommending  follow-up.  Patient received morphine, Zofran, liter fluids in ED.  General surgery was consulted.  Patient refuses NG tube.  Review of Systems: As per HPI otherwise all other systems reviewed and are negative.  Past Medical History:  Diagnosis Date   CAD (coronary artery disease)    COPD (chronic obstructive pulmonary disease) (HCC)    Dyspnea on exertion    GERD (gastroesophageal reflux disease)    Hyperlipidemia    MI (myocardial infarction) (HCC)    Rectal bleeding    SBO (small bowel obstruction) (HCC)    Sept '23   Tobacco abuse    Unspecified essential hypertension     Past Surgical History:  Procedure Laterality Date   CORONARY ANGIOPLASTY WITH STENT PLACEMENT     Drug eluting stent placed to the first obtuse marginal with a 2.58 mm cypher drug eluting stent. Circumflex was treated with 3.0 x 18 mm TAXUS drug-eluting stent on 07/17/03   ENTEROSCOPY N/A 11/07/2021   Procedure: ENTEROSCOPY;  Surgeon: Charlott Rakes, MD;  Location: [facility] ENDOSCOPY;  Service: Gastroenterology;  Laterality: N/A;   EYE SURGERY  1971   bilateral   PTCA     And drug-eluting stent to the RCA   RECTAL SURGERY  2008   [name] did this surgery    Social History  reports that he has quit smoking. His smoking use included cigarettes. He smoked an average of 1 pack per day. He has never used smokeless tobacco. He reports current alcohol use. He reports that he does not use drugs.  No Known Allergies  Family History  Problem Relation Age of Onset   Cancer Mother        cervical   Lung disease Father        Black lung  Reviewed on admission  Prior to Admission medications   Medication Sig Start Date End Date Taking? Authorizing Provider  atorvastatin (LIPITOR) 40 MG tablet TAKE 1 TABLET AT BEDTIME (MUST KEEP UPCOMING APPOINTMENT IN NOVEMBER 2023 WITH DR. Clifton James BEFORE ANY MORE REFILLS) 03/13/22   Kathleene Hazel, MD  clopidogrel (PLAVIX) 75 MG tablet TAKE 1 TABLET DAILY (MUST KEEP  UPCOMING APPOINTMENT IN NOVEMBER 2023 WITH DR. Clifton James BEFORE ANY MORE REFILLS) 03/13/22   Kathleene Hazel, MD  famotidine (PEPCID) 20 MG tablet Take 20 mg by mouth daily.    [provider]  losartan (COZAAR) 25 MG tablet TAKE 1 TABLET DAILY (MUST KEEP UPCOMING APPOINTMENT IN NOVEMBER 2023 WITH DR. Clifton James BEFORE ANY MORE REFILLS) 03/13/22   Kathleene Hazel, MD  metoprolol succinate (TOPROL-XL) 25 MG 24 hr tablet Take 1 tablet (25 mg total) by mouth daily. 02/24/22   Kathleene Hazel, MD  Multiple Vitamins-Minerals (MULTIVITAMIN WITH MINERALS) tablet Take 1 tablet by mouth daily.    [provider]  nitroGLYCERIN (NITROSTAT) 0.4 MG SL tablet Place 1 tablet (0.4 mg total) under the tongue as directed. Patient taking differently: Place 0.4 mg under the tongue every 5 (five) minutes as needed for chest pain. 11/10/19   Kathleene Hazel, MD  PROAIR HFA 108 [phone] Base) MCG/ACT inhaler Inhale 1 puff into the lungs every 4 (four) hours as needed for shortness of breath. 01/22/16   [provider]  SPIRIVA RESPIMAT 2.5 MCG/ACT AERS Inhale 2 puffs into the lungs daily. 12/23/18   [provider]  SYMBICORT 160-4.5 MCG/ACT inhaler Inhale 2 puffs into the lungs daily. 12/23/18   [provider]  triamcinolone cream (KENALOG) 0.1 % Apply 1 Application topically 2 (two) times daily. 11/04/20   [provider]    Physical Exam: Vitals:   06/26/22 1233 06/26/22 1233  BP:  (!) 171/96  Pulse:  76  Resp:  18  Temp:  98.2 F (36.8 C)  SpO2:  98%  Weight: 48.5 kg   Height: 5\' 10"  (1.778 m)     Physical Exam Constitutional:      General: He is not in acute distress.    Appearance: Normal appearance.  HENT:     Head: Normocephalic and atraumatic.     Mouth/Throat:     Mouth: Mucous membranes are moist.     Pharynx: Oropharynx is clear.  Eyes:     Extraocular Movements: Extraocular movements intact.     Pupils: Pupils are  equal, round, and reactive to light.  Cardiovascular:     Rate and Rhythm: Normal rate and regular rhythm.     Pulses: Normal pulses.     Heart sounds: Normal heart sounds.  Pulmonary:     Effort: Pulmonary effort is normal. No respiratory distress.     Breath sounds: Normal breath sounds.  Abdominal:     General: Bowel sounds are normal. There is no distension.     Palpations: Abdomen is soft.     Tenderness: There is abdominal tenderness.  Musculoskeletal:        General: No swelling or deformity.  Skin:    General: Skin is warm and dry.  Neurological:     General: No focal deficit present.     Mental Status: Mental status is  at baseline.    Labs on Admission: I have personally reviewed following labs and imaging studies  CBC: Recent Labs  Lab 06/26/22 1259  WBC 10.5  NEUTROABS 8.1*  HGB 14.2  HCT 43.3  MCV 93.7  PLT 312    Basic Metabolic Panel: Recent Labs  Lab 06/26/22 1259  NA 135  K 4.3  CL 96*  CO2 30  GLUCOSE 120*  BUN 19  CREATININE 1.06  CALCIUM 9.3    GFR: Estimated Creatinine Clearance: 38.1 mL/min (by C-G formula based on SCr of 1.06 mg/dL).  Liver Function Tests: Recent Labs  Lab 06/26/22 1259  AST 18  ALT 16  ALKPHOS 77  BILITOT 0.6  PROT 6.8  ALBUMIN 4.0    Urine analysis:    Component Value Date/Time   COLORURINE YELLOW 11/16/2021 2017   APPEARANCEUR CLEAR 11/16/2021 2017   LABSPEC 1.016 11/16/2021 2017   PHURINE 6.0 11/16/2021 2017   GLUCOSEU NEGATIVE 11/16/2021 2017   HGBUR NEGATIVE 11/16/2021 2017   BILIRUBINUR NEGATIVE 11/16/2021 2017   KETONESUR NEGATIVE 11/16/2021 2017   PROTEINUR NEGATIVE 11/16/2021 2017   UROBILINOGEN 1.0 11/05/2008 1811   NITRITE NEGATIVE 11/16/2021 2017   LEUKOCYTESUR NEGATIVE 11/16/2021 2017    Radiological Exams on Admission: CT ABDOMEN PELVIS WO CONTRAST  Result Date: 06/26/2022 CLINICAL DATA:  Left lower quadrant abdominal pain EXAM: CT ABDOMEN AND PELVIS WITHOUT CONTRAST TECHNIQUE:  Multidetector CT imaging of the abdomen and pelvis was performed following the standard protocol without IV contrast. RADIATION DOSE REDUCTION: This exam was performed according to the departmental dose-optimization program which includes automated exposure control, adjustment of the mA and/or kV according to patient size and/or use of iterative reconstruction technique. COMPARISON:  11/16/2021 FINDINGS: Lower chest: Advanced changes of emphysema. No pleural fluid or airspace disease. Several irregular nodular densities are identified within the right lower lobe. The largest measures 0.8 cm in appears new from previous exam, image 13/5. Hepatobiliary: Posterior right lobe of liver cyst appears unchanged measuring 9 mm. No suspicious liver abnormality. Gallbladder appears within normal limits. No bile duct dilatation. Pancreas: Unremarkable. No pancreatic ductal dilatation or surrounding inflammatory changes. Spleen: Normal in size without focal abnormality. Adrenals/Urinary Tract: Normal adrenal glands. No nephrolithiasis or hydronephrosis. Simple right kidney cyst is unchanged measuring 1.9 cm, image 20/2. No follow-up imaging recommended. The urinary bladder contains multiple tiny layering hyperdensities within the dependent portion of the bladder which appear increased from the previous exam and may represent debris and/or stones. Stomach/Bowel: Stomach appears normal. There are several loops of abnormal dilated small bowel within the central abdomen with air-fluid levels. These measure up to 4.7 cm. Transition to decreased caliber small bowel noted within the lower abdomen and pelvis, image 53/3. The distal small bowel loops are decreased in caliber. No pathologic dilatation of the colon. Extensive sigmoid diverticulosis without signs of acute diverticulitis. Vascular/Lymphatic: Aortic atherosclerosis. No aneurysm. No abdominopelvic adenopathy identified. Reproductive: Spondylosis identified within the lumbar  spine. Other: The S no free fluid or fluid collections. No signs of pneumoperitoneum. Musculoskeletal: No acute or significant osseous findings. IMPRESSION: 1. Examination is positive for small bowel obstruction with transition to decreased caliber small bowel within the lower abdomen/ pelvis. 2. Sigmoid diverticulosis without signs of acute diverticulitis. 3. Multiple tiny layering hyperdensities within the dependent portion of the bladder may represent debris and/or stones. These appear increased when compared with the previous exam. 4. Several irregular nodular densities are identified within the right lower lobe. The largest measures 0.8 cm in  appears new from previous exam. Per Fleischner Society Guidelines, recommend a non-contrast Chest CT at 3-6 months, then another non-contrast Chest CT at 18-24 months. 5.  Aortic Atherosclerosis (ICD10-I70.0). Electronically Signed   By: Signa Kell M.D.   On: 06/26/2022 13:26    EKG: Not performed in the ED  Assessment/Plan Active Problems:   HLD (hyperlipidemia)   Essential hypertension   CAD (coronary artery disease)   COPD (chronic obstructive pulmonary disease) (HCC)   GERD   Chronic diastolic CHF (congestive heart failure) (HCC)   SBO (small bowel obstruction) (HCC)   SBO > Presenting with 2 days of worsening abdominal pain with associated nausea vomiting diarrhea.  Has had decreased p.o. intake due to this.  No fevers no bloody stools.  Similar to previous SBO. > Morphine, Zofran, liter fluids in the ED.  Patient declined NG tube based on previous experience (at that time there was some thought of prostatic mass extending to 6 rectum, however follow-up CT does not demonstrate this, see HPI for further details). > General surgery consulted in the ED and will see the patient. - Monitor on telemetry overnight - N.p.o. - Anticipate small bowel protocol, per general surgery - Pain control as needed with Toradol given normal renal function -  Supportive care  Hypertension - Holding home losartan, metoprolol in the setting of n.p.o. as above - As needed labetalol  Hyperlipidemia - Holding home atorvastatin as above  GERD - Holding home Pepcid as above  CAD - Holding home Plavix, metoprolol, atorvastatin as above  Diastolic CHF > Last echo in our system in 2014 with EF 50-55%, G1 DD. - Not on any diuretics - Holding home metoprolol in the setting of above  COPD - Continue home Spiriva, Symbicort - Continue PRN  Albuterol  DVT prophylaxis: SCDs Code Status:   Full Family Communication:  None on admission Disposition Plan:   Patient is from:  Home  Anticipated DC to:  Home  Anticipated DC date:  1 to 4 days  Anticipated DC barriers: None  Consults called:  General surgery Admission status:  Observation, telemetry  Severity of Illness: The appropriate patient status for this patient is OBSERVATION. Observation status is judged to be reasonable and necessary in order to provide the required intensity of service to ensure the patient's safety. The patient's presenting symptoms, physical exam findings, and initial radiographic and laboratory data in the context of their medical condition is felt to place them at decreased risk for further clinical deterioration. Furthermore, it is anticipated that the patient will be medically stable for discharge from the hospital within 2 midnights of admission.   Synetta Fail MD [facility]  How to contact the [facility] Attending or Consulting provider 7A - 7P or covering provider during after hours 7P -7A, for this patient?   Check the care team in [facility] and look for a) attending/consulting [facility] provider listed and b) the [facility] team listed Log into [email or web address] and use [facility]'s universal password to access. If you do not have the password, please contact the hospital operator. Locate the [facility] provider you are looking for under [facility] and page to a number that  you can be directly reached. If you still have difficulty reaching the provider, please page the [facility] (Director on Call) for the Hospitalists listed on amion for assistance.  06/26/2022, 3:32 PM

## 2022-06-26 NOTE — Consult Note (Signed)
Austin [name] 06/15/1940  [id number].    Requesting MD: Dr. Gwyneth Sprout Chief Complaint/Reason for Consult: SBO  HPI: Austin [name] is a 82 y.o. male with a hx of CAD on Plavix (last dose 5/11), COPD, GERD, HTN, HLD who presented to the ED for abdominal pain. Patient reports pain in his left abdomen, 1 inch from the umbilicus that has been present since Sept 2023. This will intermittently get worse, most recent on Sat 5/11, without associated n/v/d. Last episode of diarrhea was yesterday which was non-bloody and non-melanous. Last episode of flatus last night. N/v resolved earlier this morning. Has tolerated liquids since then without worsening of his symptoms. His nausea resolved and abdominal pain feels back to his baseline after medications in the ED.   He was admitted w/  SBO Sept 2023 after onset of his symptoms. Looks like our team saw, along with GI. Per notes, he had shrapnel removed from his abdomen while in [place] but does not believe this required any sort of large incision on his abdomen. He otherwise denies any known abdominal surgical history. During admission in Sept 2023 GI saw and did a push enteroscopy that showed medium sized hiatal hernia but was otherwise normal (Unable to advance enteroscope distal to 170 cm from the incisors due to excessive looping). SBO clinically resolved. F/u CTE w/ unusual appearance of asymmetric low-attenuation adjacent to the base of cecum and along the lateral wall of the terminal ileum, which may be encasing the appendix. He was admitted again in Oct 2023 w/ diverticulitis tx w/ abx and soft tissue mass extending from the posterior aspect of the prostate gland into the anterior aspect of the distal sigmoid colon and rectum. PSA wnl. Since then had a colonoscopy in Dec 2023 at [facility] that showed multiple medium diverticula in the sigmoid colon and one polyp in transverse colon that was c/w tubular adenoma on path. Reports 60lb weight loss since  onset of symptoms in Sept 2023, that is overall unintentional, but does report he has been fairly restrictive in his diet since his episode of diverticulitis and trying to prevent this from happening again. No personal or family hx of IBD or colon CA. Reports his Mom passed away from metastatic ovarian cancer.   ROS: ROS As above.   Family History  Problem Relation Age of Onset   Cancer Mother        cervical   Lung disease Father        Black lung    Past Medical History:  Diagnosis Date   CAD (coronary artery disease)    COPD (chronic obstructive pulmonary disease) (HCC)    Dyspnea on exertion    GERD (gastroesophageal reflux disease)    Hyperlipidemia    MI (myocardial infarction) (HCC)    Rectal bleeding    SBO (small bowel obstruction) (HCC)    Sept '23   Tobacco abuse    Unspecified essential hypertension     Past Surgical History:  Procedure Laterality Date   CORONARY ANGIOPLASTY WITH STENT PLACEMENT     Drug eluting stent placed to the first obtuse marginal with a 2.58 mm cypher drug eluting stent. Circumflex was treated with 3.0 x 18 mm TAXUS drug-eluting stent on 07/17/03   ENTEROSCOPY N/A 11/07/2021   Procedure: ENTEROSCOPY;  Surgeon: Charlott Rakes, MD;  Location: [facility] ENDOSCOPY;  Service: Gastroenterology;  Laterality: N/A;   EYE SURGERY  1971   bilateral   PTCA     And  drug-eluting stent to the RCA   RECTAL SURGERY  2008   [name] did this surgery    Social History:  reports that he has quit smoking. His smoking use included cigarettes. He smoked an average of 1 pack per day. He has never used smokeless tobacco. He reports current alcohol use. He reports that he does not use drugs.  Allergies: No Known Allergies  (Not in a hospital admission)    Physical Exam: Blood pressure (!) 171/96, pulse 76, temperature 98.2 F (36.8 C), resp. rate 18, height 5\' 10"  (1.778 m), weight 48.5 kg, SpO2 98 %. General: pleasant, WD/WN male who is laying in bed in  NAD HEENT: head is normocephalic, atraumatic.  Sclera are noninjected.  PERRL.  Ears and nose without any masses or lesions.  Mouth is pink and moist. Dentition fair Heart: regular, rate, and rhythm.   Lungs: CTAB, no wheezes, rhonchi, or rales noted.  Respiratory effort nonlabored Abd: Soft, ND, NT, +BS. No masses, hernias, or organomegaly MS: no BUE or BLE edema Skin: warm and dry  Psych: A&Ox4 with an appropriate affect Neuro: cranial nerves grossly intact, normal speech, thought process intact, moves all extremities, gait not assessed  Results for orders placed or performed during the hospital encounter of 06/26/22 (from the past 48 hour(s))  Comprehensive metabolic panel     Status: Abnormal   Collection Time: 06/26/22 12:59 PM  Result Value Ref Range   Sodium 135 135 - 145 mmol/L   Potassium 4.3 3.5 - 5.1 mmol/L   Chloride 96 (L) 98 - 111 mmol/L   CO2 30 22 - 32 mmol/L   Glucose, Bld 120 (H) 70 - 99 mg/dL    Comment: Glucose reference range applies only to samples taken after fasting for at least 8 hours.   BUN 19 8 - 23 mg/dL   Creatinine, Ser 1.61 0.61 - 1.24 mg/dL   Calcium 9.3 8.9 - 09.6 mg/dL   Total Protein 6.8 6.5 - 8.1 g/dL   Albumin 4.0 3.5 - 5.0 g/dL   AST 18 15 - 41 U/L   ALT 16 0 - 44 U/L   Alkaline Phosphatase 77 38 - 126 U/L   Total Bilirubin 0.6 0.3 - 1.2 mg/dL   GFR, Estimated >04 >54 mL/min    Comment: (NOTE) Calculated using the CKD-EPI Creatinine Equation (2021)    Anion gap 9 5 - 15    Comment: Performed at [facility], [address] [place], [place] [address]  CBC with Differential     Status: Abnormal   Collection Time: 06/26/22 12:59 PM  Result Value Ref Range   WBC 10.5 4.0 - 10.5 K/uL   RBC 4.62 4.22 - 5.81 MIL/uL   Hemoglobin 14.2 13.0 - 17.0 g/dL   HCT 91.4 78.2 - 95.6 %   MCV 93.7 80.0 - 100.0 fL   MCH 30.7 26.0 - 34.0 pg   MCHC 32.8 30.0 - 36.0 g/dL   RDW 21.3 08.6 - 57.8 %   Platelets 312 150 - 400 K/uL   nRBC 0.0 0.0 - 0.2 %    Neutrophils Relative % 77 %   Neutro Abs 8.1 (H) 1.7 - 7.7 K/uL   Lymphocytes Relative 12 %   Lymphs Abs 1.3 0.7 - 4.0 K/uL   Monocytes Relative 9 %   Monocytes Absolute 1.0 0.1 - 1.0 K/uL   Eosinophils Relative 1 %   Eosinophils Absolute 0.1 0.0 - 0.5 K/uL   Basophils Relative 0 %  Basophils Absolute 0.0 0.0 - 0.1 K/uL   Immature Granulocytes 1 %   Abs Immature Granulocytes 0.05 0.00 - 0.07 K/uL    Comment: Performed at [facility], [address] [place], [place] [address]  Lipase, blood     Status: None   Collection Time: 06/26/22 12:59 PM  Result Value Ref Range   Lipase 32 11 - 51 U/L    Comment: Performed at [facility], [address] [place], [place] [address]   CT ABDOMEN PELVIS WO CONTRAST  Result Date: 06/26/2022 CLINICAL DATA:  Left lower quadrant abdominal pain EXAM: CT ABDOMEN AND PELVIS WITHOUT CONTRAST TECHNIQUE: Multidetector CT imaging of the abdomen and pelvis was performed following the standard protocol without IV contrast. RADIATION DOSE REDUCTION: This exam was performed according to the departmental dose-optimization program which includes automated exposure control, adjustment of the mA and/or kV according to patient size and/or use of iterative reconstruction technique. COMPARISON:  11/16/2021 FINDINGS: Lower chest: Advanced changes of emphysema. No pleural fluid or airspace disease. Several irregular nodular densities are identified within the right lower lobe. The largest measures 0.8 cm in appears new from previous exam, image 13/5. Hepatobiliary: Posterior right lobe of liver cyst appears unchanged measuring 9 mm. No suspicious liver abnormality. Gallbladder appears within normal limits. No bile duct dilatation. Pancreas: Unremarkable. No pancreatic ductal dilatation or surrounding inflammatory changes. Spleen: Normal in size without focal abnormality. Adrenals/Urinary Tract: Normal adrenal glands. No nephrolithiasis or hydronephrosis. Simple  right kidney cyst is unchanged measuring 1.9 cm, image 20/2. No follow-up imaging recommended. The urinary bladder contains multiple tiny layering hyperdensities within the dependent portion of the bladder which appear increased from the previous exam and may represent debris and/or stones. Stomach/Bowel: Stomach appears normal. There are several loops of abnormal dilated small bowel within the central abdomen with air-fluid levels. These measure up to 4.7 cm. Transition to decreased caliber small bowel noted within the lower abdomen and pelvis, image 53/3. The distal small bowel loops are decreased in caliber. No pathologic dilatation of the colon. Extensive sigmoid diverticulosis without signs of acute diverticulitis. Vascular/Lymphatic: Aortic atherosclerosis. No aneurysm. No abdominopelvic adenopathy identified. Reproductive: Spondylosis identified within the lumbar spine. Other: The S no free fluid or fluid collections. No signs of pneumoperitoneum. Musculoskeletal: No acute or significant osseous findings. IMPRESSION: 1. Examination is positive for small bowel obstruction with transition to decreased caliber small bowel within the lower abdomen/ pelvis. 2. Sigmoid diverticulosis without signs of acute diverticulitis. 3. Multiple tiny layering hyperdensities within the dependent portion of the bladder may represent debris and/or stones. These appear increased when compared with the previous exam. 4. Several irregular nodular densities are identified within the right lower lobe. The largest measures 0.8 cm in appears new from previous exam. Per Fleischner Society Guidelines, recommend a non-contrast Chest CT at 3-6 months, then another non-contrast Chest CT at 18-24 months. 5.  Aortic Atherosclerosis (ICD10-I70.0). Electronically Signed   By: Signa Kell M.D.   On: 06/26/2022 13:26    Anti-infectives (From admission, onward)    None       Assessment/Plan SBO This is a 82 y.o. male with no prior  abdominal surgeries who has had left sided abdominal pain and 60lb unintentional weight loss since onset of his symptoms in Sept 2023. He has undergone significant workup since that time including multiple CT's and endoscopies. CTE 10/2021 w/ unusual appearance of asymmetric low-attenuation adjacent to the base of cecum and along the lateral wall of  the terminal ileum, which may be encasing the appendix. CT 11/2021 w/ soft tissue mass extending from the posterior aspect of the prostate gland into the anterior aspect of the distal sigmoid colon and rectum. PSA wnl. He had a  push enteroscopy in Sept 2023 that showed medium sized hiatal hernia but was otherwise normal (Unable to advance enteroscope distal to 170 cm from the incisors due to excessive looping). He had a colonoscopy in Dec 2023 at [facility] that showed multiple medium diverticula in the sigmoid colon and one polyp in transverse colon that was c/w tubular adenoma on path. Presents today w/ worsening of left sided abdominal pain, n/v/d. Since presentation, his symptoms have returned to baseline. He is currently ND/NT. He is HDS without fever, tachycardia or hypotension. WBC wnl. I do not think he needs emergency surgery at this time.Marland Kitchen He does not want an NGT and with resolution of his symptoms, I think it is okay to hold off right now. Would keep npo overnight. Reasonable to do SBO protocol orally to see how things look on follow up films. Will discuss with MD, but ultimately he may need a dx lap. Unsure if this would be something we do here vs try to get him over his current flare of symptoms and arrange as an outpatient. We will follow with you.    FEN - NPO, IVF per [facility] VTE - SCDs, okay for chem ppx, hold Plavix (last dose 5/11) ID - None Foley - None Dispo - Admit to [facility].   I reviewed nursing notes, ED provider notes, last 24 h vitals and pain scores, last 48 h intake and output, last 24 h labs and trends, and last 24 h imaging results.  Jacinto Halim, [facility] 06/26/2022, 3:15 PM Please see Amion for pager number during day hours 7:00am-4:30pm

## 2022-06-27 ENCOUNTER — Inpatient Hospital Stay (HOSPITAL_COMMUNITY): Payer: No Typology Code available for payment source

## 2022-06-27 ENCOUNTER — Observation Stay (HOSPITAL_COMMUNITY): Payer: No Typology Code available for payment source

## 2022-06-27 DIAGNOSIS — J439 Emphysema, unspecified: Secondary | ICD-10-CM | POA: Diagnosis not present

## 2022-06-27 DIAGNOSIS — J449 Chronic obstructive pulmonary disease, unspecified: Secondary | ICD-10-CM | POA: Diagnosis present

## 2022-06-27 DIAGNOSIS — I5032 Chronic diastolic (congestive) heart failure: Secondary | ICD-10-CM | POA: Diagnosis present

## 2022-06-27 DIAGNOSIS — Z85828 Personal history of other malignant neoplasm of skin: Secondary | ICD-10-CM | POA: Diagnosis not present

## 2022-06-27 DIAGNOSIS — N329 Bladder disorder, unspecified: Secondary | ICD-10-CM | POA: Diagnosis present

## 2022-06-27 DIAGNOSIS — Z681 Body mass index (BMI) 19 or less, adult: Secondary | ICD-10-CM | POA: Diagnosis not present

## 2022-06-27 DIAGNOSIS — E43 Unspecified severe protein-calorie malnutrition: Secondary | ICD-10-CM | POA: Diagnosis present

## 2022-06-27 DIAGNOSIS — Z87891 Personal history of nicotine dependence: Secondary | ICD-10-CM | POA: Diagnosis not present

## 2022-06-27 DIAGNOSIS — I2510 Atherosclerotic heart disease of native coronary artery without angina pectoris: Secondary | ICD-10-CM | POA: Diagnosis present

## 2022-06-27 DIAGNOSIS — R634 Abnormal weight loss: Secondary | ICD-10-CM | POA: Diagnosis present

## 2022-06-27 DIAGNOSIS — K56609 Unspecified intestinal obstruction, unspecified as to partial versus complete obstruction: Secondary | ICD-10-CM | POA: Diagnosis present

## 2022-06-27 DIAGNOSIS — E785 Hyperlipidemia, unspecified: Secondary | ICD-10-CM | POA: Diagnosis present

## 2022-06-27 DIAGNOSIS — Z86010 Personal history of colonic polyps: Secondary | ICD-10-CM | POA: Diagnosis not present

## 2022-06-27 DIAGNOSIS — Z7902 Long term (current) use of antithrombotics/antiplatelets: Secondary | ICD-10-CM | POA: Diagnosis not present

## 2022-06-27 DIAGNOSIS — Z955 Presence of coronary angioplasty implant and graft: Secondary | ICD-10-CM | POA: Diagnosis not present

## 2022-06-27 DIAGNOSIS — C786 Secondary malignant neoplasm of retroperitoneum and peritoneum: Secondary | ICD-10-CM | POA: Diagnosis present

## 2022-06-27 DIAGNOSIS — C180 Malignant neoplasm of cecum: Secondary | ICD-10-CM | POA: Diagnosis present

## 2022-06-27 DIAGNOSIS — Z5331 Laparoscopic surgical procedure converted to open procedure: Secondary | ICD-10-CM | POA: Diagnosis not present

## 2022-06-27 DIAGNOSIS — Z5329 Procedure and treatment not carried out because of patient's decision for other reasons: Secondary | ICD-10-CM | POA: Diagnosis present

## 2022-06-27 DIAGNOSIS — K219 Gastro-esophageal reflux disease without esophagitis: Secondary | ICD-10-CM | POA: Diagnosis present

## 2022-06-27 DIAGNOSIS — I110 Hypertensive heart disease with heart failure: Secondary | ICD-10-CM | POA: Diagnosis present

## 2022-06-27 DIAGNOSIS — Z79899 Other long term (current) drug therapy: Secondary | ICD-10-CM | POA: Diagnosis not present

## 2022-06-27 DIAGNOSIS — C179 Malignant neoplasm of small intestine, unspecified: Secondary | ICD-10-CM | POA: Diagnosis present

## 2022-06-27 DIAGNOSIS — K56699 Other intestinal obstruction unspecified as to partial versus complete obstruction: Secondary | ICD-10-CM | POA: Diagnosis present

## 2022-06-27 DIAGNOSIS — K449 Diaphragmatic hernia without obstruction or gangrene: Secondary | ICD-10-CM | POA: Diagnosis present

## 2022-06-27 DIAGNOSIS — I252 Old myocardial infarction: Secondary | ICD-10-CM | POA: Diagnosis not present

## 2022-06-27 DIAGNOSIS — K222 Esophageal obstruction: Secondary | ICD-10-CM | POA: Diagnosis present

## 2022-06-27 LAB — COMPREHENSIVE METABOLIC PANEL
ALT: 15 U/L (ref 0–44)
AST: 15 U/L (ref 15–41)
Albumin: 2.9 g/dL — ABNORMAL LOW (ref 3.5–5.0)
Alkaline Phosphatase: 63 U/L (ref 38–126)
Anion gap: 8 (ref 5–15)
BUN: 19 mg/dL (ref 8–23)
CO2: 29 mmol/L (ref 22–32)
Calcium: 8.4 mg/dL — ABNORMAL LOW (ref 8.9–10.3)
Chloride: 100 mmol/L (ref 98–111)
Creatinine, Ser: 0.94 mg/dL (ref 0.61–1.24)
GFR, Estimated: >60 mL/min (ref >60)
Glucose, Bld: 103 mg/dL — ABNORMAL HIGH (ref 70–99)
Potassium: 3.8 mmol/L (ref 3.5–5.1)
Sodium: 137 mmol/L (ref 135–145)
Total Bilirubin: 0.8 mg/dL (ref 0.3–1.2)
Total Protein: 5.1 g/dL — ABNORMAL LOW (ref 6.5–8.1)

## 2022-06-27 LAB — CBC
HCT: 35.8 % — ABNORMAL LOW (ref 39.0–52.0)
Hemoglobin: 12.2 g/dL — ABNORMAL LOW (ref 13.0–17.0)
MCH: 32 pg (ref 26.0–34.0)
MCHC: 34.1 g/dL (ref 30.0–36.0)
MCV: 94 fL (ref 80.0–100.0)
Platelets: 257 10*3/uL (ref 150–400)
RBC: 3.81 MIL/uL — ABNORMAL LOW (ref 4.22–5.81)
RDW: 13.2 % (ref 11.5–15.5)
WBC: 11.2 10*3/uL — ABNORMAL HIGH (ref 4.0–10.5)
nRBC: 0 % (ref 0.0–0.2)

## 2022-06-27 LAB — PSA: Prostatic Specific Antigen: 2.44 ng/mL (ref 0.00–4.00)

## 2022-06-27 LAB — TSH: TSH: 1.698 u[IU]/mL (ref 0.350–4.500)

## 2022-06-27 MED ORDER — ENSURE ENLIVE PO LIQD
237.0000 mL | Freq: Two times a day (BID) | ORAL | Status: DC
  Administered 2022-06-27 – 2022-06-28 (×4): 237 mL via ORAL

## 2022-06-27 MED ORDER — IOHEXOL 350 MG/ML SOLN
100.0000 mL | Freq: Once | INTRAVENOUS | Status: AC | PRN
  Administered 2022-06-27: 100 mL via INTRAVENOUS

## 2022-06-27 MED ORDER — BOOST / RESOURCE BREEZE PO LIQD CUSTOM: 1.0000 | Freq: Three times a day (TID) | ORAL | Status: DC

## 2022-06-27 NOTE — Progress Notes (Signed)
[facility] Progress Note  Patient: Austin [name]    ZOX:[id number]  DOA: 06/26/2022    Date of Service: the patient was seen and examined on 06/27/2022  Brief hospital course: Patient is an 82 year old male with past medical history of previous small bowel striction, hypertension, COPD, diastolic CHF and CAD who presented to the emergency room with nausea, vomiting, diarrhea and left-sided abdominal pain that have been going on for 2 days.  Patient states the symptoms are similar to a small bowel obstruction presentation last year.  In the emergency room, CT scan noted small obstruction with transition point.  Patient brought in for further evaluation.   Assessment and Plan: Nausea/vomiting/diarrhea/left-sided abdominal pain: This immediate episode looks to be much improved.  Repeat x-ray today this morning notes passing of contrast and normal bowel gas pattern.  Have started on clear liquids.  However, in discussion with the patient and his wife, this has been going on now for the last 6 to 7 months.  Patient states that an episode will suddenly occur with sudden severe onset vomiting and/or diarrhea.  Sometimes there is severe mid to left-sided abdominal pain with it.  This will go on for several days and patient just rests and does not eat and symptoms abate.  Then he is able to eat as normal.  However, appetite has greatly dropped (see below), and part due to fear that eating could be causing this.  Does not appear to be diverticulitis, see below.  General surgery following.  Will check lab work as below.  Also check CT angiogram looking for signs of ischemic colitis.  General surgery had mentioned about the possibility of diagnostic laparoscopic.  Less likely, but will check stool panel  COPD: Stable.  Noted clubbing on his fingers however, patient denies any cough or baseline shortness of breath.  Diastolic CHF: stable, normal BNP  60 pound weight loss/protein calorie malnutrition:  Patient states this is attributable to this recurrent cycle as mentioned above, however seems to be more than that.  Certainly concerning for possible malignancy.  Will check a PSA (given CT findings of abdomen and pelvis in October) and TSH.  Nutrition to see.  Patient had a colonoscopy done at the [place] 6 months ago that was unremarkable other than 1 polyp taken out.  History of diverticulosis: See above, do not think that this is diverticulitis with this type of recurrence.  Body mass index is 15.82 kg/m.        Consultants: General surgery Nutrition  Procedures: None  Antimicrobials: None  Code Status: Full code   Subjective: Patient currently feels better, denies any abdominal pain, nausea  Objective: Vital signs were reviewed and unremarkable. Vitals:   06/27/22 0443 06/27/22 0854  BP: (!) 144/76 139/69  Pulse: 65 (!) 56  Resp:  16  Temp: 97.6 F (36.4 C) (!) 97.4 F (36.3 C)  SpO2: 96% 98%    Intake/Output Summary (Last 24 hours) at 06/27/2022 1559 Last data filed at 06/27/2022 1145 Gross per 24 hour  Intake 873.33 ml  Output --  Net 873.33 ml   Filed Weights   06/26/22 1233 06/27/22 0443  Weight: 48.5 kg 50 kg   Body mass index is 15.82 kg/m.  Exam:  General: Alert and oriented x 3, emaciated HEENT: Normocephalic, atraumatic, mucous membranes slightly dry Cardiovascular: The rate and rhythm, S1-S2 Respiratory: Clear to auscultation bilaterally Abdomen: Soft, nontender, nondistended, hypoactive bowel sounds Musculoskeletal: No clubbing or cyanosis or edema Skin: No skin breaks,  tears or lesions Psychiatry: Appropriate, no evidence of psychoses Neurology: No focal deficits  Data Reviewed: PSA back at 2.44, white blood cell count of 11.2 with hemoglobin of 12.2.  Albumin of 2.9  Disposition:  Status is: Inpatient    Anticipated discharge date: 5/16  Remaining issues to be resolved so that patient can be discharged:  -Evaluation of CT  angiogram -Follow-up on TSH and stool panel -Discussed with general surgery about diagnostic laparoscopic   Family Communication: At bedside DVT Prophylaxis: SCDs Start: 06/26/22 1531    Author: Hollice Espy ,MD 06/27/2022 3:59 PM  To reach On-call, see care teams to locate the attending and reach out via [email or web address]. Between 7PM-7AM, please contact night-coverage If you still have difficulty reaching the attending provider, please page the [facility] (Director on Call) for [facility] on amion for assistance.

## 2022-06-27 NOTE — TOC Initial Note (Signed)
Transition of Care [facility]) - Initial/Assessment Note    Patient Details  Name: Austin [name] MRN: [id number] Date of Birth: 1940/06/26  [facility] CM/SW Contact:    Kingsley Plan, RN Phone Number: 06/27/2022, 1:13 PM  Clinical Narrative:                  Spoke to patient and wife at bedside.   [facility] at [facility] PAtient consented for NCM to call PCP and fax information.   NCM called [facility] [phone] PCP is Janace Aris fax [phone]. NCM asked for social worker contact information, and was told if patient needs anything at discharge call main phone number and they will get message to primary care team.   Information faxed to Janace Aris fax [phone] Expected Discharge Plan: Home/Self Care Barriers to Discharge: Continued Medical Work up   Patient Goals and CMS Choice Patient states their goals for this hospitalization and ongoing recovery are:: to return to home      ownership interest in [facility].provided to:: Patient    Expected Discharge Plan and Services   Discharge Planning Services: CM Consult   Living arrangements for the past 2 months: Single Family Home                                      Prior Living Arrangements/Services Living arrangements for the past 2 months: Single Family Home Lives with:: Spouse Patient language and need for interpreter reviewed:: Yes Do you feel safe going back to the place where you live?: Yes      Need for Family Participation in Patient Care: Yes (Comment) Care giver support system in place?: Yes (comment)   Criminal Activity/Legal Involvement Pertinent to Current Situation/Hospitalization: No - Comment as needed  Activities of Daily Living Home Assistive Devices/Equipment: Eyeglasses ADL Screening (condition at time of admission) Patient's cognitive ability adequate to safely complete daily activities?: Yes Is the patient deaf or have difficulty hearing?:  No Does the patient have difficulty seeing, even when wearing glasses/contacts?: No Does the patient have difficulty concentrating, remembering, or making decisions?: Yes Patient able to express need for assistance with ADLs?: Yes Does the patient have difficulty dressing or bathing?: No Independently performs ADLs?: Yes (appropriate for developmental age) Does the patient have difficulty walking or climbing stairs?: No Weakness of Legs: None Weakness of Arms/Hands: None  Permission Sought/Granted   Permission granted to share information with : Yes, Verbal Permission Granted  Share Information with NAME: spouse [name]           Emotional Assessment Appearance:: Appears stated age Attitude/Demeanor/Rapport: Engaged Affect (typically observed): Accepting Orientation: : Oriented to Self, Oriented to Place, Oriented to  Time, Oriented to Situation Alcohol / Substance Use: Not Applicable Psych Involvement: No (comment)  Admission diagnosis:  Small bowel obstruction (HCC) [K56.609] SBO (small bowel obstruction) (HCC) [K56.609] Patient Active Problem List   Diagnosis Date Noted   SBO (small bowel obstruction) (HCC) 06/26/2022   Acute diverticulitis 11/16/2021   Abdominal mass 11/16/2021   Lactic acidosis 11/16/2021   Acute prerenal azotemia 11/16/2021   Chronic diastolic CHF (congestive heart failure) (HCC)    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   Weight loss, unintentional 11/08/2021   AKI (acute kidney injury) (HCC) 11/08/2021   Abnormal computed tomography of cecum and terminal ileum 11/08/2021   Epigastric abdominal pain 11/07/2021  Nausea and vomiting 11/07/2021   Small bowel obstruction (HCC) 11/05/2021   Dehydration 01/27/2009   COPD (chronic obstructive pulmonary disease) (HCC) 01/27/2009   HLD (hyperlipidemia) 01/21/2009   TOBACCO ABUSE 01/21/2009   Essential hypertension 01/21/2009   CAD (coronary artery disease) 01/21/2009   GERD 01/21/2009   DYSPNEA ON EXERTION  01/21/2009   PCP:  [facility]:   [facility] [place], [place] - [address] [place] [address] Phone: [phone] Fax: [phone]  [facility] - [place], [place] - [address] [place] [address] Phone: [phone] Fax: [phone]     Social Determinants of Health (SDOH) Social History: SDOH Screenings   Food Insecurity: No Food Insecurity (06/26/2022)  Housing: Low Risk  (06/26/2022)  Transportation Needs: No Transportation Needs (06/26/2022)  Utilities: Not At Risk (06/26/2022)  Tobacco Use: Medium Risk (06/26/2022)   SDOH Interventions:     Readmission Risk Interventions     No data to display

## 2022-06-27 NOTE — Progress Notes (Signed)
Progress Note     Subjective: Having watery stools. No further nausea/emesis or abdominal pain. States he has these episodes for 2-3 days at a time every 3 weeks  Objective: Vital signs in last 24 hours: Temp:  [97.4 F (36.3 C)-98.7 F (37.1 C)] 97.4 F (36.3 C) (05/14 0854) Pulse Rate:  [56-76] 56 (05/14 0854) Resp:  [16-18] 16 (05/14 0854) BP: (136-171)/(67-96) 139/69 (05/14 0854) SpO2:  [94 %-98 %] 98 % (05/14 0854) Weight:  [48.5 kg-50 kg] 50 kg (05/14 0443) Last BM Date : 06/26/22  Intake/Output from previous day: 05/13 0701 - 05/14 0700 In: 1633.3 [I.V.:633.3; IV Piggyback:1000] Out: -  Intake/Output this shift: No intake/output data recorded.  PE: General: pleasant, WD, male who is laying in bed in NAD Lungs:Respiratory effort nonlabored Abd: soft, NT, ND MSK: all 4 extremities are symmetrical with no cyanosis, clubbing, or edema. Skin: warm and dry Psych: A&Ox3 with an appropriate affect.    Lab Results:  Recent Labs    06/26/22 1259 06/27/22 0107  WBC 10.5 11.2*  HGB 14.2 12.2*  HCT 43.3 35.8*  PLT 312 257   BMET Recent Labs    06/26/22 1259 06/27/22 0107  NA 135 137  K 4.3 3.8  CL 96* 100  CO2 30 29  GLUCOSE 120* 103*  BUN 19 19  CREATININE 1.06 0.94  CALCIUM 9.3 8.4*   PT/INR No results for input(s): "LABPROT", "INR" in the last 72 hours. CMP     Component Value Date/Time   NA 137 06/27/2022 0107   K 3.8 06/27/2022 0107   CL 100 06/27/2022 0107   CO2 29 06/27/2022 0107   GLUCOSE 103 (H) 06/27/2022 0107   BUN 19 06/27/2022 0107   CREATININE 0.94 06/27/2022 0107   CALCIUM 8.4 (L) 06/27/2022 0107   PROT 5.1 (L) 06/27/2022 0107   ALBUMIN 2.9 (L) 06/27/2022 0107   AST 15 06/27/2022 0107   ALT 15 06/27/2022 0107   ALKPHOS 63 06/27/2022 0107   BILITOT 0.8 06/27/2022 0107   GFRNONAA >60 06/27/2022 0107   GFRAA  11/06/2008 1030    >60        The eGFR has been calculated using the MDRD equation. This calculation has not  been validated in all clinical situations. eGFR's persistently <60 mL/min signify possible Chronic Kidney Disease.   Lipase     Component Value Date/Time   LIPASE 32 06/26/2022 1259       Studies/Results: DG Abd Portable 1V-Small Bowel Obstruction Protocol-initial, 8 hr delay  Result Date: 06/27/2022 CLINICAL DATA:  82 year old male with evidence of small bowel obstruction on CT Abdomen and Pelvis yesterday. Refused NG tube. EXAM: PORTABLE ABDOMEN - 1 VIEW COMPARISON:  CT Abdomen and Pelvis yesterday. FINDINGS: Portable AP supine view at 0619 hours. Oral contrast is now present in small and large bowel loops, including throughout the large bowel to the level of the rectum. Some flocculated contrast in left abdominal small bowel, but no evidence of dilated small bowel loops now. Lung bases appear stable, negative. Stable visualized osseous structures. IMPRESSION: Oral contrast has been administered and the bowel-gas pattern has essentially normalized since the CT Abdomen and Pelvis yesterday. Enteric contrast present to the rectum. Electronically Signed   By: Odessa Fleming M.D.   On: 06/27/2022 07:07   CT ABDOMEN PELVIS WO CONTRAST  Result Date: 06/26/2022 CLINICAL DATA:  Left lower quadrant abdominal pain EXAM: CT ABDOMEN AND PELVIS WITHOUT CONTRAST TECHNIQUE: Multidetector CT imaging of the abdomen and pelvis  was performed following the standard protocol without IV contrast. RADIATION DOSE REDUCTION: This exam was performed according to the departmental dose-optimization program which includes automated exposure control, adjustment of the mA and/or kV according to patient size and/or use of iterative reconstruction technique. COMPARISON:  11/16/2021 FINDINGS: Lower chest: Advanced changes of emphysema. No pleural fluid or airspace disease. Several irregular nodular densities are identified within the right lower lobe. The largest measures 0.8 cm in appears new from previous exam, image 13/5.  Hepatobiliary: Posterior right lobe of liver cyst appears unchanged measuring 9 mm. No suspicious liver abnormality. Gallbladder appears within normal limits. No bile duct dilatation. Pancreas: Unremarkable. No pancreatic ductal dilatation or surrounding inflammatory changes. Spleen: Normal in size without focal abnormality. Adrenals/Urinary Tract: Normal adrenal glands. No nephrolithiasis or hydronephrosis. Simple right kidney cyst is unchanged measuring 1.9 cm, image 20/2. No follow-up imaging recommended. The urinary bladder contains multiple tiny layering hyperdensities within the dependent portion of the bladder which appear increased from the previous exam and may represent debris and/or stones. Stomach/Bowel: Stomach appears normal. There are several loops of abnormal dilated small bowel within the central abdomen with air-fluid levels. These measure up to 4.7 cm. Transition to decreased caliber small bowel noted within the lower abdomen and pelvis, image 53/3. The distal small bowel loops are decreased in caliber. No pathologic dilatation of the colon. Extensive sigmoid diverticulosis without signs of acute diverticulitis. Vascular/Lymphatic: Aortic atherosclerosis. No aneurysm. No abdominopelvic adenopathy identified. Reproductive: Spondylosis identified within the lumbar spine. Other: The S no free fluid or fluid collections. No signs of pneumoperitoneum. Musculoskeletal: No acute or significant osseous findings. IMPRESSION: 1. Examination is positive for small bowel obstruction with transition to decreased caliber small bowel within the lower abdomen/ pelvis. 2. Sigmoid diverticulosis without signs of acute diverticulitis. 3. Multiple tiny layering hyperdensities within the dependent portion of the bladder may represent debris and/or stones. These appear increased when compared with the previous exam. 4. Several irregular nodular densities are identified within the right lower lobe. The largest measures  0.8 cm in appears new from previous exam. Per Fleischner Society Guidelines, recommend a non-contrast Chest CT at 3-6 months, then another non-contrast Chest CT at 18-24 months. 5.  Aortic Atherosclerosis (ICD10-I70.0). Electronically Signed   By: Signa Kell M.D.   On: 06/26/2022 13:26    Anti-infectives: Anti-infectives (From admission, onward)    None        Assessment/Plan  SBO - CT with small bowel obstruction with transition to decreased caliber small bowel within the lower abdomen/ pelvis. - protocol ordered and contrast in rectum on 8 hour delay  - start CLD, ensure  - patient has had unintentional weight loss with recurrent symptoms since Sept 2023 and significant work up. He does not need an emergent surgical procedure at this time. Will monitor/advance diet. However given his course will discuss further with team the utility of dx lap and inpatient vs outpatient timing  FEN: CLD, ensure ID: none VTE: okay for chemical ppx from surgical standpoint  I reviewed last 24 h vitals and pain scores, last 48 h intake and output, last 24 h labs and trends, and last 24 h imaging results.   LOS: 0 days   Eric Form, [facility] 06/27/2022, 9:48 AM Please see Amion for pager number during day hours 7:00am-4:30pm

## 2022-06-28 DIAGNOSIS — J439 Emphysema, unspecified: Secondary | ICD-10-CM | POA: Diagnosis not present

## 2022-06-28 DIAGNOSIS — K56609 Unspecified intestinal obstruction, unspecified as to partial versus complete obstruction: Secondary | ICD-10-CM | POA: Diagnosis not present

## 2022-06-28 DIAGNOSIS — I5032 Chronic diastolic (congestive) heart failure: Secondary | ICD-10-CM | POA: Diagnosis not present

## 2022-06-28 DIAGNOSIS — I2510 Atherosclerotic heart disease of native coronary artery without angina pectoris: Secondary | ICD-10-CM | POA: Diagnosis not present

## 2022-06-28 DIAGNOSIS — R634 Abnormal weight loss: Secondary | ICD-10-CM

## 2022-06-28 NOTE — Progress Notes (Signed)
PROGRESS NOTE    Austin [name]  WUJ:[id number] DOB: 20-Jan-1941 DOA: 06/26/2022 PCP: [facility]    Brief Narrative:   Patient is a 82 year old male with past medical history of previous small bowel obstruction, hypertension, COPD, diastolic CHF and CAD who presented to the emergency room with nausea, vomiting, diarrhea and left-sided abdominal pain  for 2 days similar to previous bowel obstruction.  In the ED patient was slightly hypertensive.  CBC within normal range including lipase.  CT scan of the abdomen and pelvis however showed small bowel obstruction with transition point and diverticulosis.  General surgery was consulted and patient was then admitted to the hospital for further evaluation and treatment.  Assessment and Plan:  Small bowel obstruction.  Patient was treated conservatively.  X-ray showed abdomen with passage of contrast.  Similar episodes in the past with resolution with bowel rest.  General surgery on board.  CT angiogram of the abdomen pending.      GI pathogen panel pending.  Has been advanced to soft diet today.  Will continue to monitor closely.  Follow surgical recommendation.  COPD: Appears stable.   Diastolic CHF: Complains.  At this time.  Normal BNP.  protein calorie malnutrition:  Reported 60 pound weight loss.  TSH at 1.6.  PSA within normal range. Previous colonoscopy done at the [place] 6 months ago that was unremarkable other than 1 polyp taken out.  Consult dietician.  On Ensure supplement.   History of diverticulosis: No evidence of diverticulitis.    DVT prophylaxis: SCDs Start: 06/26/22 1531   Code Status:     Code Status: Full Code  Disposition: Likely in 1 to 2 days when okay with surgery.  Status is: Inpatient Remains inpatient appropriate because: Bowel obstruction, closer monitoring.   Family Communication: Spoke with the patient's spouse at bedside.  Consultants:  General surgery.  Procedures:  None  Antimicrobials:   None  Anti-infectives (From admission, onward)    None      Subjective: Today, patient was seen and examined at bedside.  Patient did complain of 1 loose bowel movement this morning.  Denies any nausea or vomiting.  Has been advised to soft diet by general surgery today.  Objective: Vitals:   06/27/22 2028 06/28/22 0500 06/28/22 0517 06/28/22 0747  BP: 138/64  131/74 133/72  Pulse: (!) 59  (!) 58 74  Resp:    16  Temp: 98 F (36.7 C)  97.6 F (36.4 C) 97.8 F (36.6 C)  TempSrc:   Oral Oral  SpO2: 98%  95% 97%  Weight:  52.8 kg    Height:        Intake/Output Summary (Last 24 hours) at 06/28/2022 1340 Last data filed at 06/28/2022 1026 Gross per 24 hour  Intake 243 ml  Output --  Net 243 ml   Filed Weights   06/26/22 1233 06/27/22 0443 06/28/22 0500  Weight: 48.5 kg 50 kg 52.8 kg    Physical Examination: Body mass index is 16.7 kg/m.  General: Thinly built, not in obvious distress, elderly male, Communicative, HENT:   No scleral pallor or icterus noted. Oral mucosa is moist.  Chest:  Clear breath sounds.  Diminished breath sounds bilaterally. No crackles or wheezes.  CVS: S1 &S2 heard. No murmur.  Regular rate and rhythm. Abdomen: Soft, nontender, nondistended.  Bowel sounds are heard.   Extremities: No cyanosis, clubbing or edema.  Peripheral pulses are palpable. Psych: Alert, awake and oriented, normal mood CNS:  No  cranial nerve deficits.  Power equal in all extremities.   Skin: Warm and dry.  No rashes noted.  Data Reviewed:   CBC: Recent Labs  Lab 06/26/22 1259 06/27/22 0107  WBC 10.5 11.2*  NEUTROABS 8.1*  --   HGB 14.2 12.2*  HCT 43.3 35.8*  MCV 93.7 94.0  PLT 312 257    Basic Metabolic Panel: Recent Labs  Lab 06/26/22 1259 06/27/22 0107  NA 135 137  K 4.3 3.8  CL 96* 100  CO2 30 29  GLUCOSE 120* 103*  BUN 19 19  CREATININE 1.06 0.94  CALCIUM 9.3 8.4*    Liver Function Tests: Recent Labs  Lab 06/26/22 1259 06/27/22 0107  AST  18 15  ALT 16 15  ALKPHOS 77 63  BILITOT 0.6 0.8  PROT 6.8 5.1*  ALBUMIN 4.0 2.9*     Radiology Studies: DG Abd Portable 1V-Small Bowel Obstruction Protocol-initial, 8 hr delay  Result Date: 06/27/2022 CLINICAL DATA:  82 year old male with evidence of small bowel obstruction on CT Abdomen and Pelvis yesterday. Refused NG tube. EXAM: PORTABLE ABDOMEN - 1 VIEW COMPARISON:  CT Abdomen and Pelvis yesterday. FINDINGS: Portable AP supine view at 0619 hours. Oral contrast is now present in small and large bowel loops, including throughout the large bowel to the level of the rectum. Some flocculated contrast in left abdominal small bowel, but no evidence of dilated small bowel loops now. Lung bases appear stable, negative. Stable visualized osseous structures. IMPRESSION: Oral contrast has been administered and the bowel-gas pattern has essentially normalized since the CT Abdomen and Pelvis yesterday. Enteric contrast present to the rectum. Electronically Signed   By: Odessa Fleming M.D.   On: 06/27/2022 07:07      LOS: 1 day    Joycelyn Das, MD [facility] Available via Epic secure chat 7am-7pm After these hours, please refer to coverage provider listed on [email or web address] 06/28/2022, 1:40 PM

## 2022-06-28 NOTE — Progress Notes (Signed)
Subjective/Chief Complaint: PT doing well this AM Tol  clears   Objective: Vital signs in last 24 hours: Temp:  [97.4 F (36.3 C)-98.3 F (36.8 C)] 97.8 F (36.6 C) (05/15 0747) Pulse Rate:  [56-74] 74 (05/15 0747) Resp:  [14-16] 16 (05/15 0747) BP: (131-139)/(64-74) 133/72 (05/15 0747) SpO2:  [95 %-98 %] 97 % (05/15 0747) Weight:  [52.8 kg] 52.8 kg (05/15 0500) Last BM Date : 06/27/22  Intake/Output from previous day: 05/14 0701 - 05/15 0700 In: 240 [P.O.:240] Out: -  Intake/Output this shift: Total I/O In: 240 [P.O.:240] Out: -   PE:  Constitutional: No acute distress, conversant, appears states age. Eyes: Anicteric sclerae, moist conjunctiva, no lid lag Lungs: Clear to auscultation bilaterally, normal respiratory effort CV: regular rate and rhythm, no murmurs, no peripheral edema, pedal pulses 2+ GI: Soft, no masses or hepatosplenomegaly, non-tender to palpation Skin: No rashes, palpation reveals normal turgor Psychiatric: appropriate judgment and insight, oriented to person, place, and time   Lab Results:  Recent Labs    06/26/22 1259 06/27/22 0107  WBC 10.5 11.2*  HGB 14.2 12.2*  HCT 43.3 35.8*  PLT 312 257   BMET Recent Labs    06/26/22 1259 06/27/22 0107  NA 135 137  K 4.3 3.8  CL 96* 100  CO2 30 29  GLUCOSE 120* 103*  BUN 19 19  CREATININE 1.06 0.94  CALCIUM 9.3 8.4*   PT/INR No results for input(s): "LABPROT", "INR" in the last 72 hours. ABG No results for input(s): "PHART", "HCO3" in the last 72 hours.  Invalid input(s): "PCO2", "PO2"  Studies/Results: DG Abd Portable 1V-Small Bowel Obstruction Protocol-initial, 8 hr delay  Result Date: 06/27/2022 CLINICAL DATA:  82 year old male with evidence of small bowel obstruction on CT Abdomen and Pelvis yesterday. Refused NG tube. EXAM: PORTABLE ABDOMEN - 1 VIEW COMPARISON:  CT Abdomen and Pelvis yesterday. FINDINGS: Portable AP supine view at 0619 hours. Oral contrast is now present in  small and large bowel loops, including throughout the large bowel to the level of the rectum. Some flocculated contrast in left abdominal small bowel, but no evidence of dilated small bowel loops now. Lung bases appear stable, negative. Stable visualized osseous structures. IMPRESSION: Oral contrast has been administered and the bowel-gas pattern has essentially normalized since the CT Abdomen and Pelvis yesterday. Enteric contrast present to the rectum. Electronically Signed   By: Odessa Fleming M.D.   On: 06/27/2022 07:07   CT ABDOMEN PELVIS WO CONTRAST  Result Date: 06/26/2022 CLINICAL DATA:  Left lower quadrant abdominal pain EXAM: CT ABDOMEN AND PELVIS WITHOUT CONTRAST TECHNIQUE: Multidetector CT imaging of the abdomen and pelvis was performed following the standard protocol without IV contrast. RADIATION DOSE REDUCTION: This exam was performed according to the departmental dose-optimization program which includes automated exposure control, adjustment of the mA and/or kV according to patient size and/or use of iterative reconstruction technique. COMPARISON:  11/16/2021 FINDINGS: Lower chest: Advanced changes of emphysema. No pleural fluid or airspace disease. Several irregular nodular densities are identified within the right lower lobe. The largest measures 0.8 cm in appears new from previous exam, image 13/5. Hepatobiliary: Posterior right lobe of liver cyst appears unchanged measuring 9 mm. No suspicious liver abnormality. Gallbladder appears within normal limits. No bile duct dilatation. Pancreas: Unremarkable. No pancreatic ductal dilatation or surrounding inflammatory changes. Spleen: Normal in size without focal abnormality. Adrenals/Urinary Tract: Normal adrenal glands. No nephrolithiasis or hydronephrosis. Simple right kidney cyst is unchanged measuring 1.9 cm, image 20/2.  No follow-up imaging recommended. The urinary bladder contains multiple tiny layering hyperdensities within the dependent portion of  the bladder which appear increased from the previous exam and may represent debris and/or stones. Stomach/Bowel: Stomach appears normal. There are several loops of abnormal dilated small bowel within the central abdomen with air-fluid levels. These measure up to 4.7 cm. Transition to decreased caliber small bowel noted within the lower abdomen and pelvis, image 53/3. The distal small bowel loops are decreased in caliber. No pathologic dilatation of the colon. Extensive sigmoid diverticulosis without signs of acute diverticulitis. Vascular/Lymphatic: Aortic atherosclerosis. No aneurysm. No abdominopelvic adenopathy identified. Reproductive: Spondylosis identified within the lumbar spine. Other: The S no free fluid or fluid collections. No signs of pneumoperitoneum. Musculoskeletal: No acute or significant osseous findings. IMPRESSION: 1. Examination is positive for small bowel obstruction with transition to decreased caliber small bowel within the lower abdomen/ pelvis. 2. Sigmoid diverticulosis without signs of acute diverticulitis. 3. Multiple tiny layering hyperdensities within the dependent portion of the bladder may represent debris and/or stones. These appear increased when compared with the previous exam. 4. Several irregular nodular densities are identified within the right lower lobe. The largest measures 0.8 cm in appears new from previous exam. Per Fleischner Society Guidelines, recommend a non-contrast Chest CT at 3-6 months, then another non-contrast Chest CT at 18-24 months. 5.  Aortic Atherosclerosis (ICD10-I70.0). Electronically Signed   By: Signa Kell M.D.   On: 06/26/2022 13:26    Anti-infectives: Anti-infectives (From admission, onward)    None       Assessment/Plan:  SBO - CTA results pending.  No stenosis I can see -ADv diet as tol to reg -No emergent/urgent surgery needed at this point     FEN: Soft, ensure ID: none VTE: okay for chemical ppx from surgical standpoint   I  reviewed last 24 h vitals and pain scores, last 48 h intake and output, last 24 h labs and trends, and last 24 h imaging results.    LOS: 1 day    Axel Filler 06/28/2022

## 2022-06-28 NOTE — Plan of Care (Signed)

## 2022-06-29 DIAGNOSIS — N3289 Other specified disorders of bladder: Secondary | ICD-10-CM

## 2022-06-29 DIAGNOSIS — K56609 Unspecified intestinal obstruction, unspecified as to partial versus complete obstruction: Secondary | ICD-10-CM | POA: Diagnosis not present

## 2022-06-29 DIAGNOSIS — I2510 Atherosclerotic heart disease of native coronary artery without angina pectoris: Secondary | ICD-10-CM | POA: Diagnosis not present

## 2022-06-29 DIAGNOSIS — I5032 Chronic diastolic (congestive) heart failure: Secondary | ICD-10-CM | POA: Diagnosis not present

## 2022-06-29 DIAGNOSIS — J439 Emphysema, unspecified: Secondary | ICD-10-CM | POA: Diagnosis not present

## 2022-06-29 LAB — GASTROINTESTINAL PANEL BY PCR, STOOL (REPLACES STOOL CULTURE)

## 2022-06-29 LAB — CBC
HCT: 41.8 % (ref 39.0–52.0)
Hemoglobin: 14 g/dL (ref 13.0–17.0)
MCH: 31 pg (ref 26.0–34.0)
MCHC: 33.5 g/dL (ref 30.0–36.0)
MCV: 92.5 fL (ref 80.0–100.0)
Platelets: 304 10*3/uL (ref 150–400)
RBC: 4.52 MIL/uL (ref 4.22–5.81)
RDW: 13.1 % (ref 11.5–15.5)
WBC: 6.9 10*3/uL (ref 4.0–10.5)
nRBC: 0 % (ref 0.0–0.2)

## 2022-06-29 LAB — COMPREHENSIVE METABOLIC PANEL
ALT: 17 U/L (ref 0–44)
AST: 20 U/L (ref 15–41)
Albumin: 3.7 g/dL (ref 3.5–5.0)
Alkaline Phosphatase: 78 U/L (ref 38–126)
Anion gap: 11 (ref 5–15)
BUN: 13 mg/dL (ref 8–23)
CO2: 26 mmol/L (ref 22–32)
Calcium: 9 mg/dL (ref 8.9–10.3)
Chloride: 99 mmol/L (ref 98–111)
Creatinine, Ser: 0.92 mg/dL (ref 0.61–1.24)
GFR, Estimated: >60 mL/min (ref >60)
Glucose, Bld: 96 mg/dL (ref 70–99)
Potassium: 4 mmol/L (ref 3.5–5.1)
Sodium: 136 mmol/L (ref 135–145)
Total Bilirubin: 0.6 mg/dL (ref 0.3–1.2)
Total Protein: 6.3 g/dL — ABNORMAL LOW (ref 6.5–8.1)

## 2022-06-29 MED ORDER — METRONIDAZOLE 500 MG/100ML IV SOLN
500.0000 mg | INTRAVENOUS | Status: AC
  Administered 2022-06-30: 500 mg via INTRAVENOUS
  Filled 2022-06-29: qty 100

## 2022-06-29 MED ORDER — SODIUM CHLORIDE 0.9 % IV SOLN
2.0000 g | INTRAVENOUS | Status: AC
  Administered 2022-06-30: 2 g via INTRAVENOUS
  Filled 2022-06-29: qty 20

## 2022-06-29 MED ORDER — BOOST / RESOURCE BREEZE PO LIQD CUSTOM
1.0000 | Freq: Three times a day (TID) | ORAL | Status: DC
  Administered 2022-06-29 – 2022-07-01 (×4): 1 via ORAL

## 2022-06-29 MED ORDER — CHLORHEXIDINE GLUCONATE CLOTH 2 % EX PADS
6.0000 | MEDICATED_PAD | Freq: Once | CUTANEOUS | Status: AC
  Administered 2022-06-30: 6 via TOPICAL

## 2022-06-29 MED ORDER — METRONIDAZOLE 500 MG/100ML IV SOLN: 500.0000 mg | INTRAVENOUS | Status: DC

## 2022-06-29 MED ORDER — ENSURE ENLIVE PO LIQD
237.0000 mL | Freq: Three times a day (TID) | ORAL | Status: DC
  Administered 2022-06-29 (×2): 237 mL via ORAL

## 2022-06-29 MED ORDER — CEFAZOLIN SODIUM-DEXTROSE 2-4 GM/100ML-% IV SOLN: 2.0000 g | INTRAVENOUS | Status: DC

## 2022-06-29 MED ORDER — CHLORHEXIDINE GLUCONATE CLOTH 2 % EX PADS
6.0000 | MEDICATED_PAD | Freq: Once | CUTANEOUS | Status: AC
  Administered 2022-06-29: 6 via TOPICAL

## 2022-06-29 NOTE — Progress Notes (Signed)
Urology Consult  Referring physician: Dr. Heinz Knuckles Reason for referral: Bladder mass  Chief Complaint: Bladder mass  History of Present Illness:  Austin [name] is an 82 year old male presenting to [facility] emergency department with complaint of abdominal pain, N/V, diarrhea, and unintended weight loss of around 60 pounds over the last 6 months.  PMH significant for SBO, HLD, GERD, HTN, COPD, diastolic CHF, and CAD with DES to RCA.  [facility] was consulted to speak to a bladder mass with an interval increase in size which we spoke to in October 2023.  I can see where cystoscopy was ordered and canceled for reasons unknown.  Patient states that he presented to the [facility] and received a clear cystoscopy there.  His wife is an established patient of ours and they are familiar with the practice.  Patient reports no lower urinary tract symptoms and only has urgency first thing in the morning.  No hematuria or retention has been noted.   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  Patient is scheduled for diagnostic laparoscopy with possible small bowel resection with general surgery tomorrow.  Past Medical History:  Diagnosis Date   CAD (coronary artery disease)    COPD (chronic obstructive pulmonary disease) (HCC)    Dyspnea on exertion    GERD (gastroesophageal reflux disease)    Hyperlipidemia    MI (myocardial infarction) (HCC)    Rectal bleeding    SBO (small bowel obstruction) (HCC)    Sept '23   Tobacco abuse    Unspecified essential hypertension    Past Surgical History:  Procedure Laterality Date   CORONARY ANGIOPLASTY WITH STENT PLACEMENT     Drug eluting stent placed to the first obtuse marginal with a 2.58 mm cypher drug eluting stent. Circumflex was treated with 3.0 x 18 mm TAXUS drug-eluting stent on 07/17/03    ENTEROSCOPY N/A 11/07/2021   Procedure: ENTEROSCOPY;  Surgeon: Charlott Rakes, MD;  Location: [facility] ENDOSCOPY;  Service: Gastroenterology;  Laterality: N/A;   EYE SURGERY  1971   bilateral   PTCA     And drug-eluting stent to the RCA   RECTAL SURGERY  2008   [name] did this surgery    Allergies: No Known Allergies  Family History  Problem Relation Age of Onset   Cancer Mother        cervical   Lung disease Father        Black lung   Social History:  reports that he has quit smoking. His smoking use included cigarettes. He smoked an average of 1 pack per day. He has never used smokeless tobacco. He reports that he does not currently use alcohol. He reports that he does not use drugs.  ROS: All systems are reviewed and negative except as noted.  Physical Exam:  Vital signs in last 24 hours: Temp:  [97.8 F (36.6 C)-98.4 F (36.9 C)] 97.8 F (36.6 C) (05/16 0821) Pulse Rate:  [61-65] 62 (05/16 0821) Resp:  [16-17] 17 (05/16 0821) BP: (136-140)/(73-77) 138/75 (05/16 0821) SpO2:  [94 %-98 %] 96 % (05/16 0821) Weight:  [52.4 kg] 52.4 kg (05/16 0500)  General: appears stated age, cachectic Cardiovascular: Skin warm; not flushed Respiratory: Breaths quiet; no shortness of breath Neurological: Normal sensation to touch Musculoskeletal: Normal motor function arms and legs Genitourinary: no indwelling lines  Laboratory Data:  Results for orders placed or  performed during the hospital encounter of 06/26/22 (from the past 72 hour(s))  Comprehensive metabolic panel     Status: Abnormal   Collection Time: 06/26/22 12:59 PM  Result Value Ref Range   Sodium 135 135 - 145 mmol/L   Potassium 4.3 3.5 - 5.1 mmol/L   Chloride 96 (L) 98 - 111 mmol/L   CO2 30 22 - 32 mmol/L   Glucose, Bld 120 (H) 70 - 99 mg/dL    Comment: Glucose reference range applies only to samples taken after fasting for at least 8 hours.   BUN 19 8 - 23 mg/dL   Creatinine, Ser 1.61 0.61 - 1.24 mg/dL   Calcium  9.3 8.9 - 09.6 mg/dL   Total Protein 6.8 6.5 - 8.1 g/dL   Albumin 4.0 3.5 - 5.0 g/dL   AST 18 15 - 41 U/L   ALT 16 0 - 44 U/L   Alkaline Phosphatase 77 38 - 126 U/L   Total Bilirubin 0.6 0.3 - 1.2 mg/dL   GFR, Estimated >04 >54 mL/min    Comment: (NOTE) Calculated using the CKD-EPI Creatinine Equation (2021)    Anion gap 9 5 - 15    Comment: Performed at [facility], [address] [place], [place] [address]  CBC with Differential     Status: Abnormal   Collection Time: 06/26/22 12:59 PM  Result Value Ref Range   WBC 10.5 4.0 - 10.5 K/uL   RBC 4.62 4.22 - 5.81 MIL/uL   Hemoglobin 14.2 13.0 - 17.0 g/dL   HCT 91.4 78.2 - 95.6 %   MCV 93.7 80.0 - 100.0 fL   MCH 30.7 26.0 - 34.0 pg   MCHC 32.8 30.0 - 36.0 g/dL   RDW 21.3 08.6 - 57.8 %   Platelets 312 150 - 400 K/uL   nRBC 0.0 0.0 - 0.2 %   Neutrophils Relative % 77 %   Neutro Abs 8.1 (H) 1.7 - 7.7 K/uL   Lymphocytes Relative 12 %   Lymphs Abs 1.3 0.7 - 4.0 K/uL   Monocytes Relative 9 %   Monocytes Absolute 1.0 0.1 - 1.0 K/uL   Eosinophils Relative 1 %   Eosinophils Absolute 0.1 0.0 - 0.5 K/uL   Basophils Relative 0 %   Basophils Absolute 0.0 0.0 - 0.1 K/uL   Immature Granulocytes 1 %   Abs Immature Granulocytes 0.05 0.00 - 0.07 K/uL    Comment: Performed at [facility], [address] [place], [place] [address]  Lipase, blood     Status: None   Collection Time: 06/26/22 12:59 PM  Result Value Ref Range   Lipase 32 11 - 51 U/L    Comment: Performed at [facility], [address], [place], [place] [address]  Comprehensive metabolic panel     Status: Abnormal   Collection Time: 06/27/22  1:07 AM  Result Value Ref Range   Sodium 137 135 - 145 mmol/L   Potassium 3.8 3.5 - 5.1 mmol/L   Chloride 100 98 - 111 mmol/L   CO2 29 22 - 32 mmol/L   Glucose, Bld 103 (H) 70 - 99 mg/dL    Comment: Glucose reference range applies only to samples taken after fasting for at least 8 hours.   BUN 19 8 - 23 mg/dL    Creatinine, Ser 1.32 0.61 - 1.24 mg/dL   Calcium 8.4 (L) 8.9 - 10.3 mg/dL   Total Protein 5.1 (L) 6.5 - 8.1 g/dL   Albumin  2.9 (L) 3.5 - 5.0 g/dL   AST 15 15 - 41 U/L   ALT 15 0 - 44 U/L   Alkaline Phosphatase 63 38 - 126 U/L   Total Bilirubin 0.8 0.3 - 1.2 mg/dL   GFR, Estimated >16 >10 mL/min    Comment: (NOTE) Calculated using the CKD-EPI Creatinine Equation (2021)    Anion gap 8 5 - 15    Comment: Performed at [facility], [address], [place], [place] [address]  CBC     Status: Abnormal   Collection Time: 06/27/22  1:07 AM  Result Value Ref Range   WBC 11.2 (H) 4.0 - 10.5 K/uL   RBC 3.81 (L) 4.22 - 5.81 MIL/uL   Hemoglobin 12.2 (L) 13.0 - 17.0 g/dL   HCT 40.9 (L) 81.1 - 91.4 %   MCV 94.0 80.0 - 100.0 fL   MCH 32.0 26.0 - 34.0 pg   MCHC 34.1 30.0 - 36.0 g/dL   RDW 78.2 95.6 - 21.3 %   Platelets 257 150 - 400 K/uL   nRBC 0.0 0.0 - 0.2 %    Comment: Performed at [facility], [address], [place], [place] [address]  TSH     Status: None   Collection Time: 06/27/22  2:58 PM  Result Value Ref Range   TSH 1.698 0.350 - 4.500 uIU/mL    Comment: Performed by a 3rd Generation assay with a functional sensitivity of <=0.01 uIU/mL. Performed at [facility], [address], [place], [place] [address]   PSA     Status: None   Collection Time: 06/27/22  2:59 PM  Result Value Ref Range   Prostatic Specific Antigen 2.44 0.00 - 4.00 ng/mL    Comment: (NOTE) While PSA levels of <=4.00 ng/ml are reported as reference range, some men with levels below 4.00 ng/ml can have prostate cancer and many men with PSA above 4.00 ng/ml do not have prostate cancer.  Other tests such as free PSA, age specific reference ranges, PSA velocity and PSA doubling time may be helpful especially in men less than 42 years old. Performed at [facility], [address] [place], [place] [address]   Gastrointestinal Panel by PCR , Stool     Status: None   Collection Time:  06/27/22  4:06 PM   Specimen: Stool  Result Value Ref Range   Campylobacter species NOT DETECTED NOT DETECTED   Plesimonas shigelloides NOT DETECTED NOT DETECTED   Salmonella species NOT DETECTED NOT DETECTED   Yersinia enterocolitica NOT DETECTED NOT DETECTED   Vibrio species NOT DETECTED NOT DETECTED   Vibrio cholerae NOT DETECTED NOT DETECTED   Enteroaggregative E coli (EAEC) NOT DETECTED NOT DETECTED   Enteropathogenic E coli (EPEC) NOT DETECTED NOT DETECTED   Enterotoxigenic E coli (ETEC) NOT DETECTED NOT DETECTED   Shiga like toxin producing E coli (STEC) NOT DETECTED NOT DETECTED   Shigella/Enteroinvasive E coli (EIEC) NOT DETECTED NOT DETECTED   Cryptosporidium NOT DETECTED NOT DETECTED   Cyclospora cayetanensis NOT DETECTED NOT DETECTED   Entamoeba histolytica NOT DETECTED NOT DETECTED   Giardia lamblia NOT DETECTED NOT DETECTED   Adenovirus F40/41 NOT DETECTED NOT DETECTED   Astrovirus NOT DETECTED NOT DETECTED   Norovirus GI/GII NOT DETECTED NOT DETECTED   Rotavirus A NOT DETECTED NOT DETECTED   Sapovirus (I, II, IV, and V) NOT DETECTED NOT DETECTED    Comment: Performed at [facility], [address] [place],  [place] [address]  CBC     Status: None   Collection Time: 06/29/22 10:51 AM  Result Value Ref Range   WBC 6.9 4.0 - 10.5 K/uL   RBC 4.52 4.22 - 5.81 MIL/uL   Hemoglobin 14.0 13.0 - 17.0 g/dL   HCT 60.4 54.0 - 98.1 %   MCV 92.5 80.0 - 100.0 fL   MCH 31.0 26.0 - 34.0 pg   MCHC 33.5 30.0 - 36.0 g/dL   RDW 19.1 47.8 - 29.5 %   Platelets 304 150 - 400 K/uL   nRBC 0.0 0.0 - 0.2 %    Comment: Performed at [facility], [address] [place], [place] [address]   Recent Results (from the past 240 hour(s))  Gastrointestinal Panel by PCR , Stool     Status: None   Collection Time: 06/27/22  4:06 PM   Specimen: Stool  Result Value Ref Range Status   Campylobacter species NOT DETECTED NOT DETECTED Final   Plesimonas shigelloides NOT DETECTED NOT  DETECTED Final   Salmonella species NOT DETECTED NOT DETECTED Final   Yersinia enterocolitica NOT DETECTED NOT DETECTED Final   Vibrio species NOT DETECTED NOT DETECTED Final   Vibrio cholerae NOT DETECTED NOT DETECTED Final   Enteroaggregative E coli (EAEC) NOT DETECTED NOT DETECTED Final   Enteropathogenic E coli (EPEC) NOT DETECTED NOT DETECTED Final   Enterotoxigenic E coli (ETEC) NOT DETECTED NOT DETECTED Final   Shiga like toxin producing E coli (STEC) NOT DETECTED NOT DETECTED Final   Shigella/Enteroinvasive E coli (EIEC) NOT DETECTED NOT DETECTED Final   Cryptosporidium NOT DETECTED NOT DETECTED Final   Cyclospora cayetanensis NOT DETECTED NOT DETECTED Final   Entamoeba histolytica NOT DETECTED NOT DETECTED Final   Giardia lamblia NOT DETECTED NOT DETECTED Final   Adenovirus F40/41 NOT DETECTED NOT DETECTED Final   Astrovirus NOT DETECTED NOT DETECTED Final   Norovirus GI/GII NOT DETECTED NOT DETECTED Final   Rotavirus A NOT DETECTED NOT DETECTED Final   Sapovirus (I, II, IV, and V) NOT DETECTED NOT DETECTED Final    Comment: Performed at [facility], [address] [place], [place] [address]   Creatinine: Recent Labs    06/26/22 1259 06/27/22 0107  CREATININE 1.06 0.94    Imaging: CT Angio A/P displays interval increase in bladder mass to 3.4 x 1.5 compared to imaging on 11/16/21, where mass was 1.8 x 1.1 cm. Pt states he had clear cystoscopy at the [place].   Assessment[facility]Plan:  [facility].  Mass noted on CT A/P reveals what appears to be the same mass with interval growth that was noted last year.   Discussed with patient that it would be important that he follow-up with [place] for transurethral resection of bladder tumor so that we could have the pathologist analyzed sample.  He expressed understanding and states that his wife is a regular patient of Dr. Annabell Howells, they are familiar with the practice.  I will pass on their information to our schedulers  today.  Unremarkable CBC/BMP  All of his workup and treatment will be completed on an outpatient basis and urology will not need to follow while he is in the hospital.  Will sign off at this time.  Please feel free to contact with any questions, concerns, or acute urologic changes.   Scherrie Bateman Levada Bowersox 06/29/2022, 12:43 PM  Pager: [phone]

## 2022-06-29 NOTE — Progress Notes (Signed)
PROGRESS NOTE    Austin [name]  ZOX:[id number] DOB: 02-Jun-1940 DOA: 06/26/2022 PCP: [facility]    Brief Narrative:   Patient is a 82 year old male with past medical history of previous small bowel obstruction, hypertension, COPD, diastolic CHF and CAD who presented to the emergency room with nausea, vomiting, diarrhea and left-sided abdominal pain  for 2 days similar to previous bowel obstruction.  In the ED, patient was slightly hypertensive.  CBC within normal range including lipase.  CT scan of the abdomen and pelvis however showed small bowel obstruction with transition point and diverticulosis.  General surgery was consulted and patient was then admitted to the hospital for further evaluation and treatment.  Assessment and Plan:  Small bowel obstruction.   Patient was treated conservatively.  X-ray showed abdomen with passage of contrast.  Similar episodes in the past with resolution with bowel rest.  General surgery on board.  CT angiogram of the abdomen with multiple findings including thickening of the wall of the terminal ileum suspicious for primary neoplasm with progressive proximal peritoneal soft tissue thickening suggestive of carcinomatosis with bladder mass and distal esophageal wall thickening.  General surgery on board and planning for diagnostic laparoscopic tomorrow.  I have informed Dr. Dulce Sellar from GI yesterday for possible need for endoscopic evaluation.  I have texted Dr. Jacquelyne Balint urology for possible consultation as well.  Bladder mass.  Patient tells that he did have previous CT imaging at the [place] and had bladder finding at that time as well.  He feels like cystoscopy was done at that time.  Will inform urology while in the hospital.  COPD: Appears stable.   Diastolic CHF: Considered at this time.    Normal BNP.  protein calorie malnutrition:  Reported 60 pound weight loss.  TSH at 1.6.  PSA within normal range.  As per the patient, colonoscopy done at  the [place] 6 months ago that was unremarkable other than 1 polyp taken out.  Consult dietician.  On Ensure supplement.   History of diverticulosis: No evidence of diverticulitis at this time..    DVT prophylaxis: SCD's Start: 06/29/22 1001 SCDs Start: 06/26/22 1531   Code Status:     Code Status: Full Code  Disposition: Uncertain likely 2 to 3 days.  Status is: Inpatient Remains inpatient appropriate because: Bowel obstruction, closer monitoring, possible diagnostic laparoscopy, possible GI and urology evaluation.   Family Communication: Spoke with the patient's spouse at bedside on 06/28/2022.  Consultants:  General surgery. GI and urology informed  Procedures:  None  Antimicrobials:  None  Anti-infectives (From admission, onward)    Start     Dose/Rate Route Frequency Ordered Stop   06/30/22 0800  ceFAZolin (ANCEF) IVPB 2g/100 mL premix       See Hyperspace for full Linked Orders Report.   2 g 200 mL/hr over 30 Minutes Intravenous To ShortStay Surgical 06/29/22 1000 07/01/22 0800   06/30/22 0800  metroNIDAZOLE (FLAGYL) IVPB 500 mg       See Hyperspace for full Linked Orders Report.   500 mg 100 mL/hr over 60 Minutes Intravenous To ShortStay Surgical 06/29/22 1000 07/01/22 0800      Subjective: Today, patient was seen and examined at bedside.  Denies nausea vomiting fever chills.  No overt abdominal pain.  Has been tolerating clears.  Objective: Vitals:   06/28/22 2138 06/29/22 0500 06/29/22 0535 06/29/22 0821  BP: 136/77  (!) 140/73 138/75  Pulse: 61  65 62  Resp: 16  17  Temp: 98.1 F (36.7 C)  98.4 F (36.9 C) 97.8 F (36.6 C)  TempSrc: Oral  Oral Oral  SpO2: 94%  98% 96%  Weight:  52.4 kg    Height:       No intake or output data in the 24 hours ending 06/29/22 1034  Filed Weights   06/27/22 0443 06/28/22 0500 06/29/22 0500  Weight: 50 kg 52.8 kg 52.4 kg    Physical Examination: Body mass index is 16.58 kg/m.   General: Elderly male, alert awake  and Communicative, thinly built, not in obvious distress HENT:   No scleral pallor or icterus noted. Oral mucosa is moist.  Chest:   Diminished breath sounds bilaterally. No crackles or wheezes.  CVS: S1 &S2 heard. No murmur.  Regular rate and rhythm. Abdomen: Soft, nontender, nondistended.  Bowel sounds are heard.   Extremities: No cyanosis, clubbing or edema.  Peripheral pulses are palpable. Psych: Alert, awake and oriented, normal mood CNS:  No cranial nerve deficits.  Power equal in all extremities.   Skin: Warm and dry.  No rashes noted.  Data Reviewed:   CBC: Recent Labs  Lab 06/26/22 1259 06/27/22 0107  WBC 10.5 11.2*  NEUTROABS 8.1*  --   HGB 14.2 12.2*  HCT 43.3 35.8*  MCV 93.7 94.0  PLT 312 257     Basic Metabolic Panel: Recent Labs  Lab 06/26/22 1259 06/27/22 0107  NA 135 137  K 4.3 3.8  CL 96* 100  CO2 30 29  GLUCOSE 120* 103*  BUN 19 19  CREATININE 1.06 0.94  CALCIUM 9.3 8.4*     Liver Function Tests: Recent Labs  Lab 06/26/22 1259 06/27/22 0107  AST 18 15  ALT 16 15  ALKPHOS 77 63  BILITOT 0.6 0.8  PROT 6.8 5.1*  ALBUMIN 4.0 2.9*      Radiology Studies: CT Angio Abd/Pel w/ and/or w/o  Result Date: 06/28/2022 CLINICAL DATA:  Unintentional 60 pound weight loss over 6 months Abdominal pain Vomiting Diarrhea. EXAM: CTA ABDOMEN AND PELVIS WITHOUT AND WITH CONTRAST TECHNIQUE: Multidetector CT imaging of the abdomen and pelvis was performed using the standard protocol during bolus administration of intravenous contrast. Multiplanar reconstructed images and MIPs were obtained and reviewed to evaluate the vascular anatomy. RADIATION DOSE REDUCTION: This exam was performed according to the departmental dose-optimization program which includes automated exposure control, adjustment of the mA and/or kV according to patient size and/or use of iterative reconstruction technique. CONTRAST:  OMNIPAQUE IOHEXOL 350 MG/ML SOLN COMPARISON:  06/26/2022  11/16/2021 FINDINGS: VASCULAR Aorta: Calcified atheromatous plaque seen throughout the abdominal aorta without aneurysm or flow-limiting stenosis. Thin linear web-like structure seen across the lumen of the aorta on images 37 and 46 of series 5 are favored to be remodeled plaque rather than nonflow-limiting dissections. Celiac: Mild narrowing of the origin due to calcified plaque. No significant narrowing of splenic, left gastric, or common hepatic arteries. SMA: No significant narrowing of the superior mesenteric artery. Renals: Co dominant main right renal arteries are patent without significant stenosis. Moderate to severe stenosis at the origin of the left main renal artery, best seen on image 70 of series 8. Accessory left renal artery originates approximately 2 cm below the main left renal artery and is mildly narrowed at the origin. IMA: Mild narrowing at the origin of the inferior mesenteric artery. 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 Severe stenosis of the origin of the right internal iliac artery which is otherwise patent. Right external iliac artery is diffusely calcified but patent without significant stenosis. Moderate focal stenosis of the proximal left common femoral artery predominately due to noncalcified plaque. Near complete occlusion at the origin of the internal iliac artery which is otherwise patent. Mild diffuse narrowing of the left external iliac artery due to calcified plaque. Proximal Outflow: No significant stenosis of the visualized outflow arteries. Veins: Hepatic, portal, superior mesenteric, and splenic veins are patent without significant stenosis. Reflux of contrast in the hepatic veins on arterial phase images suggestive of elevated right heart pressures. Review of the MIP images confirms the above findings. NON-VASCULAR Lower chest: Moderate emphysematous changes the lung  bases. Hepatobiliary: 9 mm simple cyst in the right liver lobe does not require dedicated imaging follow-up. 7 mm left hepatic lobe lesion (image 15, series 7) demonstrates peripheral puddling on prior CT from 11/16/2021 and most likely represents a small hemangioma. No suspicious liver lesions. No bile duct dilatation. Gallbladder is unremarkable. Pancreas: Unremarkable. No pancreatic ductal dilatation or surrounding inflammatory changes. Spleen: Normal in size without focal abnormality. Adrenals/Urinary Tract: Adrenal glands are unremarkable. 2.5 cm simple right renal cyst does not require dedicated imaging follow-up. Subcentimeter left renal hypodensities are too small to fully characterize, however also most likely represent simple cysts and do not require dedicated imaging follow-up. Kidneys and ureters otherwise unremarkable. There has been progressive increase in size of mass within the posterolateral bladder which now measures 3.4 x 1.5 cm compared to 1.8 x 1.1 cm on prior study from 11/16/2021. This is suspicious for primary bladder neoplasm. Stomach/Bowel: There is thickening of the wall of the distal esophagus which may be due to esophagitis, however underlying mass is not excluded. The degree of thickening has increased since 11/16/2021. 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 Interval resolution of previously seen small bowel dilation consistent with resolved obstruction. There is circumferential masslike thickening of the wall of the terminal ileum, best seen on images 67-86 of series 14. Sigmoid diverticulosis without evidence of acute diverticulitis. Lymphatic: No enlarged abdominal or pelvic lymph nodes. Reproductive: Unchanged moderate prostatomegaly. Other: No abdominal wall hernia or abnormality. No abdominopelvic ascites. There has been interval increase of peritoneal soft tissue thickening along the right paracolic gutter, best seen on image 34 of series 7. This is suspicious for peritoneal  carcinomatosis. Musculoskeletal: No acute or significant osseous findings. IMPRESSION: 1. Circumferential masslike thickening of the wall of the terminal ileum is suspicious for primary neoplasm. Progressive peritoneal soft tissue thickening along the right paracolic gutter suspicious for carcinomatosis. 2. Interval increase in size of mass within the posterolateral left bladder, now measuring 3.4 x 1.5 cm compared to 1.8 x 1.1 cm on prior study from 11/16/2021. This is suspicious for primary bladder neoplasm. 3. Interval resolution of previously seen small bowel dilation consistent with resolved obstruction. 4. Distal esophageal wall thickening has increased since 11/16/2021. This may be due to esophagitis, however underlying mass is not excluded. Endoscopy should be considered. 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 5. 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 6. 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 7. Interval increase in size of mass within the posterolateral bladder. This is suspicious for primary bladder neoplasm. 8. Sigmoid diverticulosis without evidence of acute diverticulitis. Electronically Signed   By: Acquanetta Belling M.D.   On: 06/28/2022 14:22      LOS:  2 days    Joycelyn Das, MD [facility] Available via Epic secure chat 7am-7pm After these hours, please refer to coverage provider listed on [email or web address] 06/29/2022, 10:34 AM

## 2022-06-29 NOTE — Progress Notes (Signed)
Initial Nutrition Assessment  DOCUMENTATION CODES:   Severe malnutrition in context of chronic illness, Underweight  INTERVENTION:  Recommend TPN if unable to advance diet past clear liquids within the next 24-48 hour Monitor magnesium, potassium, and phosphorus BID for at least 3 days, MD to replete as needed, as pt is at risk for refeeding syndrome given severe malnutrition. Contiue Ensure Enlive po BID, each supplement provides 350 kcal and 20 grams of protein. Boost Breeze po TID, each supplement provides 250 kcal and 9 grams of protein  NUTRITION DIAGNOSIS:   Severe Malnutrition related to chronic illness as evidenced by severe muscle depletion, severe fat depletion, percent weight loss.  GOAL:   Patient will meet greater than or equal to 90% of their needs  MONITOR:   PO intake, Supplement acceptance, Diet advancement, I & O's  REASON FOR ASSESSMENT:   Consult Assessment of nutrition requirement/status  ASSESSMENT:   82 y.o. male presented to the ED abdominal pain, nausea, vomiting, and diarrhea. PMH includes GERD, HTN, COPD, CHF, CAD, and HLD. Pt admitted with SBO.   Met with pt and wife in room. Shares that he has been dealing with episodes of N/V/D since October 2023, states he will have symptoms for a few days and then they will go away for a few weeks. Reports that he was told he had diverticulosis and was restricting foods that would impact that and he was still having symptoms. Was trying to drink Ensure at home but did not care for the flavor. Wife shared over the weekend just ate the broth of chicken noodle soup and was unable to tolerate that.  Pt reports a UBW of 160# and now is 107#, reports that weight loss has occurred since October time frame when other symptoms started. This is a 33% weight loss within 7 months, this is clinically significant for time frame.   RD explained current diet he is on and reviewed the menu. Encouraged pt to accept nutritional  supplements to assist in calories and protein to help prevent further weight loss.   Discussed potential for TPN with MD and surgery PA.   Medications reviewed and include: IV antibiotics Labs reviewed: Sodium 136, Potassium 4.0, BUN 13, Creatinine 0.92   NUTRITION - FOCUSED PHYSICAL EXAM:  Flowsheet Row Most Recent Value  Orbital Region Severe depletion  Upper Arm Region Severe depletion  Thoracic and Lumbar Region Severe depletion  Buccal Region Severe depletion  Temple Region Severe depletion  Clavicle Bone Region Severe depletion  Clavicle and Acromion Bone Region Severe depletion  Scapular Bone Region Severe depletion  Dorsal Hand Severe depletion  Patellar Region Severe depletion  Anterior Thigh Region Severe depletion  Posterior Calf Region Severe depletion  Edema (RD Assessment) None  Hair Reviewed  Eyes Reviewed  Mouth Reviewed  Skin Reviewed  Nails Reviewed   Diet Order:   Diet Order             Diet NPO time specified Except for: Sips with Meds  Diet effective midnight           Diet clear liquid Room service appropriate? Yes; Fluid consistency: Thin  Diet effective now                   EDUCATION NEEDS:   Education needs have been addressed  Skin:  Skin Assessment: Reviewed RN Assessment  Last BM:  5/16  Height:  Ht Readings from Last 1 Encounters:  06/26/22 5\' 10"  (1.778 m)   Weight:  Wt Readings from Last 1 Encounters:  06/29/22 52.4 kg   Ideal Body Weight:  75.5 kg  BMI:  Body mass index is 16.58 kg/m.  Estimated Nutritional Needs:  Kcal:  1700-1900 Protein:  85-105 grams Fluid:  >/= 1.7 L   Kirby Crigler RD, LDN Clinical Dietitian See [facility] for contact information.

## 2022-06-29 NOTE — Consult Note (Signed)
[facility] Gastroenterology Consultation Note  Referring Provider: No ref. provider found Primary Care Physician:  [facility]  Reason for Consultation:  nausea, vomiting, abnormal CT  HPI: Austin [name] is a 82 y.o. male admitted nausea, vomiting, diarrhea, weight loss.  CT abnormal as described below.  He has lost about 60 lbs.  Even with imaging showing "resolution" of small bowel obstruction, he reports minimal passage of flatus/stool.     Past Medical History:  Diagnosis Date   CAD (coronary artery disease)    COPD (chronic obstructive pulmonary disease) (HCC)    Dyspnea on exertion    GERD (gastroesophageal reflux disease)    Hyperlipidemia    MI (myocardial infarction) (HCC)    Rectal bleeding    SBO (small bowel obstruction) (HCC)    Sept '23   Tobacco abuse    Unspecified essential hypertension     Past Surgical History:  Procedure Laterality Date   CORONARY ANGIOPLASTY WITH STENT PLACEMENT     Drug eluting stent placed to the first obtuse marginal with a 2.58 mm cypher drug eluting stent. Circumflex was treated with 3.0 x 18 mm TAXUS drug-eluting stent on 07/17/03   ENTEROSCOPY N/A 11/07/2021   Procedure: ENTEROSCOPY;  Surgeon: Charlott Rakes, MD;  Location: [facility] ENDOSCOPY;  Service: Gastroenterology;  Laterality: N/A;   EYE SURGERY  1971   bilateral   PTCA     And drug-eluting stent to the RCA   RECTAL SURGERY  2008   [name] did this surgery    Prior to Admission medications   Medication Sig Start Date End Date Taking? Authorizing Provider  atorvastatin (LIPITOR) 40 MG tablet TAKE 1 TABLET AT BEDTIME (MUST KEEP UPCOMING APPOINTMENT IN NOVEMBER 2023 WITH DR. Clifton James BEFORE ANY MORE REFILLS) 03/13/22   Kathleene Hazel, MD  clopidogrel (PLAVIX) 75 MG tablet TAKE 1 TABLET DAILY (MUST KEEP UPCOMING APPOINTMENT IN NOVEMBER 2023 WITH DR. Clifton James BEFORE ANY MORE REFILLS) 03/13/22   Kathleene Hazel, MD  famotidine (PEPCID) 20 MG tablet Take 20  mg by mouth daily.    [provider]  losartan (COZAAR) 25 MG tablet TAKE 1 TABLET DAILY (MUST KEEP UPCOMING APPOINTMENT IN NOVEMBER 2023 WITH DR. Clifton James BEFORE ANY MORE REFILLS) 03/13/22   Kathleene Hazel, MD  metoprolol succinate (TOPROL-XL) 25 MG 24 hr tablet Take 1 tablet (25 mg total) by mouth daily. 02/24/22   Kathleene Hazel, MD  Multiple Vitamins-Minerals (MULTIVITAMIN WITH MINERALS) tablet Take 1 tablet by mouth daily.    [provider]  nitroGLYCERIN (NITROSTAT) 0.4 MG SL tablet Place 1 tablet (0.4 mg total) under the tongue as directed. Patient taking differently: Place 0.4 mg under the tongue every 5 (five) minutes as needed for chest pain. 11/10/19   Kathleene Hazel, MD  PROAIR HFA 108 [phone] Base) MCG/ACT inhaler Inhale 1 puff into the lungs every 4 (four) hours as needed for shortness of breath. 01/22/16   [provider]  SPIRIVA RESPIMAT 2.5 MCG/ACT AERS Inhale 2 puffs into the lungs daily. 12/23/18   [provider]  SYMBICORT 160-4.5 MCG/ACT inhaler Inhale 2 puffs into the lungs daily. 12/23/18   [provider]  triamcinolone cream (KENALOG) 0.1 % Apply 1 Application topically 2 (two) times daily. 11/04/20   [provider]    Current Facility-Administered Medications  Medication Dose Route Frequency Provider Last Rate Last Admin   acetaminophen (TYLENOL) tablet 650 mg  650 mg Oral Q6H PRN Synetta Fail, MD  Or   acetaminophen (TYLENOL) suppository 650 mg  650 mg Rectal Q6H PRN Synetta Fail, MD       [START ON 06/30/2022] ceFAZolin (ANCEF) IVPB 2g/100 mL premix  2 g Intravenous To SS-Surg Eric Form, PA-C       And   [START ON 06/30/2022] metroNIDAZOLE (FLAGYL) IVPB 500 mg  500 mg Intravenous To SS-Surg Kabrich, Martha H, PA-C       Chlorhexidine Gluconate Cloth 2 % PADS 6 each  6 each Topical Once Eric Form, PA-C       And   [START ON 06/30/2022] Chlorhexidine Gluconate  Cloth 2 % PADS 6 each  6 each Topical Once Eric Form, PA-C       feeding supplement (ENSURE ENLIVE / ENSURE PLUS) liquid 237 mL  237 mL Oral TID BM Eric Form, PA-C   237 mL at 06/29/22 1018   ketorolac (TORADOL) 15 MG/ML injection 7.5 mg  7.5 mg Intravenous Q6H PRN Synetta Fail, MD       prochlorperazine (COMPAZINE) injection 5 mg  5 mg Intravenous Q6H PRN Luiz Iron, NP   5 mg at 06/26/22 2040   sodium chloride flush (NS) 0.9 % injection 3 mL  3 mL Intravenous Q12H Synetta Fail, MD   3 mL at 06/29/22 1018    Allergies as of 06/26/2022   (No Known Allergies)    Family History  Problem Relation Age of Onset   Cancer Mother        cervical   Lung disease Father        Black lung    Social History   Socioeconomic History   Marital status: Married    Spouse name: Not on file   Number of children: Not on file   Years of education: Not on file   Highest education level: Not on file  Occupational History   Occupation: Retired Publishing rights manager: RETIRED   Occupation: Runs a cardiac rehab  Tobacco Use   Smoking status: Former    Packs/day: 1    Types: Cigarettes   Smokeless tobacco: Never   Tobacco comments:    Smoked for about 40 years, smokes cigarette every once in a while, maybe a pack a year.  Vaping Use   Vaping Use: Never used  Substance and Sexual Activity   Alcohol use: Not Currently   Drug use: No   Sexual activity: Not on file  Other Topics Concern   Not on file  Social History Narrative   Lives in [place] with his wife   [place] veteran   Exercises 3 times a week at the [employer]   Social Determinants of Health   Financial Resource Strain: Not on file  Food Insecurity: No Food Insecurity (06/26/2022)   Hunger Vital Sign    Worried About Running Out of Food in the Last Year: Never true    Ran Out of Food in the Last Year: Never true  Transportation Needs: No Transportation Needs (06/26/2022)   PRAPARE -  Administrator, Civil Service (Medical): No    Lack of Transportation (Non-Medical): No  Physical Activity: Not on file  Stress: Not on file  Social Connections: Not on file  Intimate Partner Violence: Not At Risk (06/26/2022)   Humiliation, Afraid, Rape, and Kick questionnaire    Fear of Current or Ex-Partner: No    Emotionally Abused: No    Physically Abused: No  Sexually Abused: No    Review of Systems: As per HPI, all others negative  Physical Exam: Vital signs in last 24 hours: Temp:  [97.8 F (36.6 C)-98.4 F (36.9 C)] 97.8 F (36.6 C) (05/16 0821) Pulse Rate:  [61-65] 62 (05/16 0821) Resp:  [16-17] 17 (05/16 0821) BP: (136-140)/(73-77) 138/75 (05/16 0821) SpO2:  [94 %-98 %] 96 % (05/16 0821) Weight:  [52.4 kg] 52.4 kg (05/16 0500) Last BM Date : 06/29/22 General:   Alert,  profoundly cachectic but NAD Head:  Normocephalic and atraumatic. Eyes:  Sclera clear, no icterus.   Conjunctiva pink. Ears:  Normal auditory acuity. Nose:  No deformity, discharge,  or lesions. Mouth:  No deformity or lesions.  Oropharynx pink & moist. Neck:  Thin but supple Lungs:  No respiratory distress Abdomen:  Soft, mild distention and tympany. No masses, hepatosplenomegaly or hernias noted. Normal bowel sounds, without guarding, and without rebound.     Msk:  cachectic but Symmetrical without gross deformities. Normal posture. Pulses:  Normal pulses noted. Extremities:  Without clubbing or edema. Neurologic:  Alert and  oriented x4;  grossly normal neurologically. Skin:  Intact without significant lesions or rashes. Psych:  Alert and cooperative. Normal mood and affect.   Lab Results: Recent Labs    06/26/22 1259 06/27/22 0107  WBC 10.5 11.2*  HGB 14.2 12.2*  HCT 43.3 35.8*  PLT 312 257   BMET Recent Labs    06/26/22 1259 06/27/22 0107  NA 135 137  K 4.3 3.8  CL 96* 100  CO2 30 29  GLUCOSE 120* 103*  BUN 19 19  CREATININE 1.06 0.94  CALCIUM 9.3 8.4*    LFT Recent Labs    06/27/22 0107  PROT 5.1*  ALBUMIN 2.9*  AST 15  ALT 15  ALKPHOS 63  BILITOT 0.8   PT/INR No results for input(s): "LABPROT", "INR" in the last 72 hours.  Studies/Results: CT Angio Abd/Pel w/ and/or w/o  Result Date: 06/28/2022 CLINICAL DATA:  Unintentional 60 pound weight loss over 6 months Abdominal pain Vomiting Diarrhea. EXAM: CTA ABDOMEN AND PELVIS WITHOUT AND WITH CONTRAST TECHNIQUE: Multidetector CT imaging of the abdomen and pelvis was performed using the standard protocol during bolus administration of intravenous contrast. Multiplanar reconstructed images and MIPs were obtained and reviewed to evaluate the vascular anatomy. RADIATION DOSE REDUCTION: This exam was performed according to the departmental dose-optimization program which includes automated exposure control, adjustment of the mA and/or kV according to patient size and/or use of iterative reconstruction technique. CONTRAST:  OMNIPAQUE IOHEXOL 350 MG/ML SOLN COMPARISON:  06/26/2022 11/16/2021 FINDINGS: VASCULAR Aorta: Calcified atheromatous plaque seen throughout the abdominal aorta without aneurysm or flow-limiting stenosis. Thin linear web-like structure seen across the lumen of the aorta on images 37 and 46 of series 5 are favored to be remodeled plaque rather than nonflow-limiting dissections. Celiac: Mild narrowing of the origin due to calcified plaque. No significant narrowing of splenic, left gastric, or common hepatic arteries. SMA: No significant narrowing of the superior mesenteric artery. Renals: Co dominant main right renal arteries are patent without significant stenosis. Moderate to severe stenosis at the origin of the left main renal artery, best seen on image 70 of series 8. Accessory left renal artery originates approximately 2 cm below the main left renal artery and is mildly narrowed at the origin. IMA: Mild narrowing at the origin of the inferior mesenteric artery. 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 Severe stenosis of the origin of the right internal iliac artery which is otherwise patent. Right external iliac artery is diffusely calcified but patent without significant stenosis. Moderate focal stenosis of the proximal left common femoral artery predominately due to noncalcified plaque. Near complete occlusion at the origin of the internal iliac artery which is otherwise patent. Mild diffuse narrowing of the left external iliac artery due to calcified plaque. Proximal Outflow: No significant stenosis of the visualized outflow arteries. Veins: Hepatic, portal, superior mesenteric, and splenic veins are patent without significant stenosis. Reflux of contrast in the hepatic veins on arterial phase images suggestive of elevated right heart pressures. Review of the MIP images confirms the above findings. NON-VASCULAR Lower chest: Moderate emphysematous changes the lung bases. Hepatobiliary: 9 mm simple cyst in the right liver lobe does not require dedicated imaging follow-up. 7 mm left hepatic lobe lesion (image 15, series 7) demonstrates peripheral puddling on prior CT from 11/16/2021 and most likely represents a small hemangioma. No suspicious liver lesions. No bile duct dilatation. Gallbladder is unremarkable. Pancreas: Unremarkable. No pancreatic ductal dilatation or surrounding inflammatory changes. Spleen: Normal in size without focal abnormality. Adrenals/Urinary Tract: Adrenal glands are unremarkable. 2.5 cm simple right renal cyst does not require dedicated imaging follow-up. Subcentimeter left renal hypodensities are too small to fully characterize, however also most likely represent simple cysts and do not require dedicated imaging follow-up. Kidneys and ureters otherwise unremarkable. There has been progressive increase in size of mass within the posterolateral bladder which now  measures 3.4 x 1.5 cm compared to 1.8 x 1.1 cm on prior study from 11/16/2021. This is suspicious for primary bladder neoplasm. Stomach/Bowel: There is thickening of the wall of the distal esophagus which may be due to esophagitis, however underlying mass is not excluded. The degree of thickening has increased since 11/16/2021. 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 Interval resolution of previously seen small bowel dilation consistent with resolved obstruction. There is circumferential masslike thickening of the wall of the terminal ileum, best seen on images 67-86 of series 14. Sigmoid diverticulosis without evidence of acute diverticulitis. Lymphatic: No enlarged abdominal or pelvic lymph nodes. Reproductive: Unchanged moderate prostatomegaly. Other: No abdominal wall hernia or abnormality. No abdominopelvic ascites. There has been interval increase of peritoneal soft tissue thickening along the right paracolic gutter, best seen on image 34 of series 7. This is suspicious for peritoneal carcinomatosis. Musculoskeletal: No acute or significant osseous findings. IMPRESSION: 1. Circumferential masslike thickening of the wall of the terminal ileum is suspicious for primary neoplasm. Progressive peritoneal soft tissue thickening along the right paracolic gutter suspicious for carcinomatosis. 2. Interval increase in size of mass within the posterolateral left bladder, now measuring 3.4 x 1.5 cm compared to 1.8 x 1.1 cm on prior study from 11/16/2021. This is suspicious for primary bladder neoplasm. 3. Interval resolution of previously seen small bowel dilation consistent with resolved obstruction. 4. Distal esophageal wall thickening has increased since 11/16/2021. This may be due to esophagitis, however underlying mass is not excluded. Endoscopy should be considered. 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 5. 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 6. 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 7. Interval increase in size of mass within the posterolateral bladder. This is suspicious for primary bladder neoplasm. 8. Sigmoid diverticulosis without evidence of acute diverticulitis. Electronically Signed   By: Acquanetta Belling M.D.   On: 06/28/2022  14:22    Impression:   Small bowel obstruction, resolving. Multiple CT findings, bladder lesion, possible lesion terminal ileum, thickening GE junction.  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  Plan:   Patient had colonoscopy to level of terminal ileum at [facility] in December 2023 with diverticulosis and small transverse colon polyp seen, otherwise unrevealing.  I do not see the indication to repeat this.  Moreover, with waxing/waning obstruction, not sure how feasible bowel prep would be. Patient had enteroscopy September 2023 here with Dr. Bosie Clos.  I reviewed images which showed hiatal hernia and Schatzki's ring but no evidence of mass or abnormality in esophagus otherwise.  In light of other more pressing findings, and recent normal mucosal views of esophagus, I do not see indication for repeat endoscopy at this time. Agree with urology consultation, although sounds like patient had outside cystoscopy which was possibly normal. I think diagnostic laparoscopy is overall best approach in this situation, and at least per preliminary notes from surgical team, looks like that is planned tentatively for tomorrow. Difficult case; unfortunately, not much for [place] to offer from GI perspective. Enid Baas will sign-off; please call with questions; thank you for the consultation.   LOS: 2 days   Shinita Mac M  06/29/2022, 11:05 AM  Cell [phone] If no answer or after 5 PM call [phone]

## 2022-06-29 NOTE — H&P (View-Only) (Signed)
 Subjective/Chief Complaint: Up walking around in room. No pain/nausea/emesis. Tolerating clears with bowel function  We discussed his CTA findings. He is aware of bladder finding being present on prior imaging and [facility]. He had a cystoscopy and thinks he was told everything looked fine. In regard to esophageal finding - he has seen GI in the past but not had EGD.  Objective: Vital signs in last 24 hours: Temp:  [98.1 F (36.7 C)-98.4 F (36.9 C)] 98.4 F (36.9 C) (05/16 0535) Pulse Rate:  [61-65] 65 (05/16 0535) Resp:  [16] 16 (05/15 2138) BP: (136-140)/(73-77) 140/73 (05/16 0535) SpO2:  [94 %-98 %] 98 % (05/16 0535) Weight:  [52.4 kg] 52.4 kg (05/16 0500) Last BM Date : 06/27/22  Intake/Output from previous day: 05/15 0701 - 05/16 0700 In: 243 [P.O.:240; I.V.:3] Out: -  Intake/Output this shift: No intake/output data recorded.  PE:  Constitutional: No acute distress, conversant, appears stated age. Lungs: normal respiratory effort on room air GI: Soft, no masses or hepatosplenomegaly, non-tender to palpation Skin: No rashes, palpation reveals normal turgor Psychiatric: appropriate judgment and insight, oriented to person, place, and time   Lab Results:  Recent Labs    06/26/22 1259 06/27/22 0107  WBC 10.5 11.2*  HGB 14.2 12.2*  HCT 43.3 35.8*  PLT 312 257    BMET Recent Labs    06/26/22 1259 06/27/22 0107  NA 135 137  K 4.3 3.8  CL 96* 100  CO2 30 29  GLUCOSE 120* 103*  BUN 19 19  CREATININE 1.06 0.94  CALCIUM 9.3 8.4*    PT/INR No results for input(s): "LABPROT", "INR" in the last 72 hours. ABG No results for input(s): "PHART", "HCO3" in the last 72 hours.  Invalid input(s): "PCO2", "PO2"  Studies/Results: CT Angio Abd/Pel w/ and/or w/o  Result Date: 06/28/2022 CLINICAL DATA:  Unintentional 60 pound weight loss over 6 months Abdominal pain Vomiting Diarrhea. EXAM: CTA ABDOMEN AND PELVIS WITHOUT AND WITH CONTRAST  TECHNIQUE: Multidetector CT imaging of the abdomen and pelvis was performed using the standard protocol during bolus administration of intravenous contrast. Multiplanar reconstructed images and MIPs were obtained and reviewed to evaluate the vascular anatomy. RADIATION DOSE REDUCTION: This exam was performed according to the departmental dose-optimization program which includes automated exposure control, adjustment of the mA and/or kV according to patient size and/or use of iterative reconstruction technique. CONTRAST:  100mL OMNIPAQUE IOHEXOL 350 MG/ML SOLN COMPARISON:  06/26/2022 11/16/2021 FINDINGS: VASCULAR Aorta: Calcified atheromatous plaque seen throughout the abdominal aorta without aneurysm or flow-limiting stenosis. Thin linear web-like structure seen across the lumen of the aorta on images 37 and 46 of series 5 are favored to be remodeled plaque rather than nonflow-limiting dissections. Celiac: Mild narrowing of the origin due to calcified plaque. No significant narrowing of splenic, left gastric, or common hepatic arteries. SMA: No significant narrowing of the superior mesenteric artery. Renals: Co dominant main right renal arteries are patent without significant stenosis. Moderate to severe stenosis at the origin of the left main renal artery, best seen on image 70 of series 8. Accessory left renal artery originates approximately 2 cm below the main left renal artery and is mildly narrowed at the origin. IMA: Mild narrowing at the origin of the inferior mesenteric artery. 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 Severe stenosis of the origin of the right internal iliac artery   which is otherwise patent. Right external iliac artery is diffusely calcified but patent without significant stenosis. Moderate focal stenosis of the proximal left common femoral artery predominately due to noncalcified plaque. Near  complete occlusion at the origin of the internal iliac artery which is otherwise patent. Mild diffuse narrowing of the left external iliac artery due to calcified plaque. Proximal Outflow: No significant stenosis of the visualized outflow arteries. Veins: Hepatic, portal, superior mesenteric, and splenic veins are patent without significant stenosis. Reflux of contrast in the hepatic veins on arterial phase images suggestive of elevated right heart pressures. Review of the MIP images confirms the above findings. NON-VASCULAR Lower chest: Moderate emphysematous changes the lung bases. Hepatobiliary: 9 mm simple cyst in the right liver lobe does not require dedicated imaging follow-up. 7 mm left hepatic lobe lesion (image 15, series 7) demonstrates peripheral puddling on prior CT from 11/16/2021 and most likely represents a small hemangioma. No suspicious liver lesions. No bile duct dilatation. Gallbladder is unremarkable. Pancreas: Unremarkable. No pancreatic ductal dilatation or surrounding inflammatory changes. Spleen: Normal in size without focal abnormality. Adrenals/Urinary Tract: Adrenal glands are unremarkable. 2.5 cm simple right renal cyst does not require dedicated imaging follow-up. Subcentimeter left renal hypodensities are too small to fully characterize, however also most likely represent simple cysts and do not require dedicated imaging follow-up. Kidneys and ureters otherwise unremarkable. There has been progressive increase in size of mass within the posterolateral bladder which now measures 3.4 x 1.5 cm compared to 1.8 x 1.1 cm on prior study from 11/16/2021. This is suspicious for primary bladder neoplasm. Stomach/Bowel: There is thickening of the wall of the distal esophagus which may be due to esophagitis, however underlying mass is not excluded. The degree of thickening has increased since 11/16/2021. 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 Interval resolution of previously seen small bowel  dilation consistent with resolved obstruction. There is circumferential masslike thickening of the wall of the terminal ileum, best seen on images 67-86 of series 14. Sigmoid diverticulosis without evidence of acute diverticulitis. Lymphatic: No enlarged abdominal or pelvic lymph nodes. Reproductive: Unchanged moderate prostatomegaly. Other: No abdominal wall hernia or abnormality. No abdominopelvic ascites. There has been interval increase of peritoneal soft tissue thickening along the right paracolic gutter, best seen on image 34 of series 7. This is suspicious for peritoneal carcinomatosis. Musculoskeletal: No acute or significant osseous findings. IMPRESSION: 1. Circumferential masslike thickening of the wall of the terminal ileum is suspicious for primary neoplasm. Progressive peritoneal soft tissue thickening along the right paracolic gutter suspicious for carcinomatosis. 2. Interval increase in size of mass within the posterolateral left bladder, now measuring 3.4 x 1.5 cm compared to 1.8 x 1.1 cm on prior study from 11/16/2021. This is suspicious for primary bladder neoplasm. 3. Interval resolution of previously seen small bowel dilation consistent with resolved obstruction. 4. Distal esophageal wall thickening has increased since 11/16/2021. This may be due to esophagitis, however underlying mass is not excluded. Endoscopy should be considered. 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 5. 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 6. 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 7. Interval increase in size of mass within the posterolateral bladder. This is suspicious for primary bladder neoplasm. 8. Sigmoid diverticulosis without evidence of acute diverticulitis. Electronically Signed   By: Farhaan  Mir M.D.   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 Progressive peritoneal soft tissue thickening along the right paracolic gutter suspicious for carcinomatosis. Bladder mass. Distal esophageal wall thickening - recc urology and GI evaluation for above findings. He has seen urology in the past and sounds like had negative cystoscopy 12/2021 - I am unable to find these results in epic - continue clears today. ensure.  - or tomorrow for dx lap and possible small bowel resection. Discussed at length CT A findings with patient and wife via telephone and recommendation for diagnostic laparoscopy tomorrow with possibility for SBR and/or biopsy pending findings   FEN: CLD, ensure ID: none VTE: okay for chemical ppx from surgical standpoint   I reviewed hospitalist notes, last 24 h vitals and pain scores, last 48 h intake and output, last 24 h labs and trends, and last 24 h imaging results.     LOS: 2 days   Emree Locicero H Seldon Barrell, PA-C [facility] 06/29/2022, 8:03 AM Please see Amion for pager number during day hours 7:00am-4:30pm   

## 2022-06-29 NOTE — Progress Notes (Signed)
Subjective/Chief Complaint: Up walking around in room. No pain/nausea/emesis. Tolerating clears with bowel function  We discussed his CTA findings. He is aware of bladder finding being present on prior imaging and saw urology at the [place] in November. He had a cystoscopy and thinks he was told everything looked fine. In regard to esophageal finding - he has seen GI in the past but not had EGD.  Objective: Vital signs in last 24 hours: Temp:  [98.1 F (36.7 C)-98.4 F (36.9 C)] 98.4 F (36.9 C) (05/16 0535) Pulse Rate:  [61-65] 65 (05/16 0535) Resp:  [16] 16 (05/15 2138) BP: (136-140)/(73-77) 140/73 (05/16 0535) SpO2:  [94 %-98 %] 98 % (05/16 0535) Weight:  [52.4 kg] 52.4 kg (05/16 0500) Last BM Date : 06/27/22  Intake/Output from previous day: 05/15 0701 - 05/16 0700 In: 243 [P.O.:240; I.V.:3] Out: -  Intake/Output this shift: No intake/output data recorded.  PE:  Constitutional: No acute distress, conversant, appears stated age. Lungs: normal respiratory effort on room air GI: Soft, no masses or hepatosplenomegaly, non-tender to palpation Skin: No rashes, palpation reveals normal turgor Psychiatric: appropriate judgment and insight, oriented to person, place, and time   Lab Results:  Recent Labs    06/26/22 1259 06/27/22 0107  WBC 10.5 11.2*  HGB 14.2 12.2*  HCT 43.3 35.8*  PLT 312 257    BMET Recent Labs    06/26/22 1259 06/27/22 0107  NA 135 137  K 4.3 3.8  CL 96* 100  CO2 30 29  GLUCOSE 120* 103*  BUN 19 19  CREATININE 1.06 0.94  CALCIUM 9.3 8.4*    PT/INR No results for input(s): "LABPROT", "INR" in the last 72 hours. ABG No results for input(s): "PHART", "HCO3" in the last 72 hours.  Invalid input(s): "PCO2", "PO2"  Studies/Results: CT Angio Abd/Pel w/ and/or w/o  Result Date: 06/28/2022 CLINICAL DATA:  Unintentional 60 pound weight loss over 6 months Abdominal pain Vomiting Diarrhea. EXAM: CTA ABDOMEN AND PELVIS WITHOUT AND WITH CONTRAST  TECHNIQUE: Multidetector CT imaging of the abdomen and pelvis was performed using the standard protocol during bolus administration of intravenous contrast. Multiplanar reconstructed images and MIPs were obtained and reviewed to evaluate the vascular anatomy. RADIATION DOSE REDUCTION: This exam was performed according to the departmental dose-optimization program which includes automated exposure control, adjustment of the mA and/or kV according to patient size and/or use of iterative reconstruction technique. CONTRAST:  OMNIPAQUE IOHEXOL 350 MG/ML SOLN COMPARISON:  06/26/2022 11/16/2021 FINDINGS: VASCULAR Aorta: Calcified atheromatous plaque seen throughout the abdominal aorta without aneurysm or flow-limiting stenosis. Thin linear web-like structure seen across the lumen of the aorta on images 37 and 46 of series 5 are favored to be remodeled plaque rather than nonflow-limiting dissections. Celiac: Mild narrowing of the origin due to calcified plaque. No significant narrowing of splenic, left gastric, or common hepatic arteries. SMA: No significant narrowing of the superior mesenteric artery. Renals: Co dominant main right renal arteries are patent without significant stenosis. Moderate to severe stenosis at the origin of the left main renal artery, best seen on image 70 of series 8. Accessory left renal artery originates approximately 2 cm below the main left renal artery and is mildly narrowed at the origin. IMA: Mild narrowing at the origin of the inferior mesenteric artery. 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 Severe stenosis of the origin of the right internal iliac artery  which is otherwise patent. Right external iliac artery is diffusely calcified but patent without significant stenosis. Moderate focal stenosis of the proximal left common femoral artery predominately due to noncalcified plaque. Near  complete occlusion at the origin of the internal iliac artery which is otherwise patent. Mild diffuse narrowing of the left external iliac artery due to calcified plaque. Proximal Outflow: No significant stenosis of the visualized outflow arteries. Veins: Hepatic, portal, superior mesenteric, and splenic veins are patent without significant stenosis. Reflux of contrast in the hepatic veins on arterial phase images suggestive of elevated right heart pressures. Review of the MIP images confirms the above findings. NON-VASCULAR Lower chest: Moderate emphysematous changes the lung bases. Hepatobiliary: 9 mm simple cyst in the right liver lobe does not require dedicated imaging follow-up. 7 mm left hepatic lobe lesion (image 15, series 7) demonstrates peripheral puddling on prior CT from 11/16/2021 and most likely represents a small hemangioma. No suspicious liver lesions. No bile duct dilatation. Gallbladder is unremarkable. Pancreas: Unremarkable. No pancreatic ductal dilatation or surrounding inflammatory changes. Spleen: Normal in size without focal abnormality. Adrenals/Urinary Tract: Adrenal glands are unremarkable. 2.5 cm simple right renal cyst does not require dedicated imaging follow-up. Subcentimeter left renal hypodensities are too small to fully characterize, however also most likely represent simple cysts and do not require dedicated imaging follow-up. Kidneys and ureters otherwise unremarkable. There has been progressive increase in size of mass within the posterolateral bladder which now measures 3.4 x 1.5 cm compared to 1.8 x 1.1 cm on prior study from 11/16/2021. This is suspicious for primary bladder neoplasm. Stomach/Bowel: There is thickening of the wall of the distal esophagus which may be due to esophagitis, however underlying mass is not excluded. The degree of thickening has increased since 11/16/2021. 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 Interval resolution of previously seen small bowel  dilation consistent with resolved obstruction. There is circumferential masslike thickening of the wall of the terminal ileum, best seen on images 67-86 of series 14. Sigmoid diverticulosis without evidence of acute diverticulitis. Lymphatic: No enlarged abdominal or pelvic lymph nodes. Reproductive: Unchanged moderate prostatomegaly. Other: No abdominal wall hernia or abnormality. No abdominopelvic ascites. There has been interval increase of peritoneal soft tissue thickening along the right paracolic gutter, best seen on image 34 of series 7. This is suspicious for peritoneal carcinomatosis. Musculoskeletal: No acute or significant osseous findings. IMPRESSION: 1. Circumferential masslike thickening of the wall of the terminal ileum is suspicious for primary neoplasm. Progressive peritoneal soft tissue thickening along the right paracolic gutter suspicious for carcinomatosis. 2. Interval increase in size of mass within the posterolateral left bladder, now measuring 3.4 x 1.5 cm compared to 1.8 x 1.1 cm on prior study from 11/16/2021. This is suspicious for primary bladder neoplasm. 3. Interval resolution of previously seen small bowel dilation consistent with resolved obstruction. 4. Distal esophageal wall thickening has increased since 11/16/2021. This may be due to esophagitis, however underlying mass is not excluded. Endoscopy should be considered. 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 5. 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 6. 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 7. Interval increase in size of mass within the posterolateral bladder. This is suspicious for primary bladder neoplasm. 8. Sigmoid diverticulosis without evidence of acute diverticulitis. Electronically Signed   By: Acquanetta Belling M.D.   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 Progressive peritoneal soft tissue thickening along the right paracolic gutter suspicious for carcinomatosis. Bladder mass. Distal esophageal wall thickening - recc urology and GI evaluation for above findings. He has seen urology in the past and sounds like had negative cystoscopy 12/2021 - I am unable to find these results in epic - continue clears today. ensure.  - or tomorrow for dx lap and possible small bowel resection. Discussed at length CT A findings with patient and wife via telephone and recommendation for diagnostic laparoscopy tomorrow with possibility for SBR and/or biopsy pending findings   FEN: CLD, ensure ID: none VTE: okay for chemical ppx from surgical standpoint   I reviewed hospitalist notes, last 24 h vitals and pain scores, last 48 h intake and output, last 24 h labs and trends, and last 24 h imaging results.     LOS: 2 days   Eric Form, [facility] 06/29/2022, 8:03 AM Please see Amion for pager number during day hours 7:00am-4:30pm

## 2022-06-30 ENCOUNTER — Inpatient Hospital Stay (HOSPITAL_COMMUNITY): Payer: No Typology Code available for payment source | Admitting: Anesthesiology

## 2022-06-30 ENCOUNTER — Other Ambulatory Visit: Payer: Self-pay

## 2022-06-30 ENCOUNTER — Encounter (HOSPITAL_COMMUNITY)
Admission: EM | Disposition: A | Discharge status: Home / Self Care | Payer: Self-pay | Source: Home / Self Care | Attending: Internal Medicine

## 2022-06-30 ENCOUNTER — Encounter (HOSPITAL_COMMUNITY): Payer: Self-pay | Admitting: Internal Medicine

## 2022-06-30 DIAGNOSIS — I2510 Atherosclerotic heart disease of native coronary artery without angina pectoris: Secondary | ICD-10-CM | POA: Diagnosis not present

## 2022-06-30 DIAGNOSIS — Z87891 Personal history of nicotine dependence: Secondary | ICD-10-CM

## 2022-06-30 DIAGNOSIS — I10 Essential (primary) hypertension: Secondary | ICD-10-CM

## 2022-06-30 DIAGNOSIS — J439 Emphysema, unspecified: Secondary | ICD-10-CM | POA: Diagnosis not present

## 2022-06-30 DIAGNOSIS — I5032 Chronic diastolic (congestive) heart failure: Secondary | ICD-10-CM | POA: Diagnosis not present

## 2022-06-30 DIAGNOSIS — E43 Unspecified severe protein-calorie malnutrition: Secondary | ICD-10-CM | POA: Insufficient documentation

## 2022-06-30 DIAGNOSIS — C180 Malignant neoplasm of cecum: Secondary | ICD-10-CM

## 2022-06-30 DIAGNOSIS — C786 Secondary malignant neoplasm of retroperitoneum and peritoneum: Secondary | ICD-10-CM

## 2022-06-30 DIAGNOSIS — I252 Old myocardial infarction: Secondary | ICD-10-CM

## 2022-06-30 DIAGNOSIS — K56609 Unspecified intestinal obstruction, unspecified as to partial versus complete obstruction: Secondary | ICD-10-CM | POA: Diagnosis not present

## 2022-06-30 HISTORY — PX: LAPAROTOMY: SHX154

## 2022-06-30 HISTORY — PX: LAPAROSCOPY: SHX197

## 2022-06-30 LAB — TYPE AND SCREEN
ABO/RH(D): A NEG
Antibody Screen: NEGATIVE

## 2022-06-30 LAB — ABO/RH: ABO/RH(D): A NEG

## 2022-06-30 LAB — MAGNESIUM: Magnesium: 2 mg/dL (ref 1.7–2.4)

## 2022-06-30 SURGERY — LAPAROSCOPY DIAGNOSTIC
Anesthesia: General | Site: Abdomen

## 2022-06-30 MED ORDER — CHLORHEXIDINE GLUCONATE 0.12 % MT SOLN
OROMUCOSAL | Status: AC
  Administered 2022-06-30: 15 mL via OROMUCOSAL
  Filled 2022-06-30: qty 15

## 2022-06-30 MED ORDER — BUPIVACAINE-EPINEPHRINE (PF) 0.25% -1:200000 IJ SOLN
INTRAMUSCULAR | Status: AC
  Filled 2022-06-30: qty 30

## 2022-06-30 MED ORDER — MIDAZOLAM HCL 2 MG/2ML IJ SOLN: 0.5000 mg | Freq: Once | INTRAMUSCULAR | Status: DC | PRN

## 2022-06-30 MED ORDER — LIDOCAINE 2% (20 MG/ML) 5 ML SYRINGE
INTRAMUSCULAR | Status: AC
  Filled 2022-06-30: qty 5

## 2022-06-30 MED ORDER — ORAL CARE MOUTH RINSE: 15.0000 mL | Freq: Once | OROMUCOSAL | Status: AC

## 2022-06-30 MED ORDER — ROCURONIUM BROMIDE 10 MG/ML (PF) SYRINGE
PREFILLED_SYRINGE | INTRAVENOUS | Status: DC | PRN
  Administered 2022-06-30: 50 mg via INTRAVENOUS

## 2022-06-30 MED ORDER — PROMETHAZINE HCL 25 MG/ML IJ SOLN: 6.2500 mg | INTRAMUSCULAR | Status: DC | PRN

## 2022-06-30 MED ORDER — SUGAMMADEX SODIUM 200 MG/2ML IV SOLN
INTRAVENOUS | Status: DC | PRN
  Administered 2022-06-30: 150 mg via INTRAVENOUS

## 2022-06-30 MED ORDER — OXYCODONE HCL 5 MG/5ML PO SOLN: 5.0000 mg | Freq: Once | ORAL | Status: DC | PRN

## 2022-06-30 MED ORDER — DEXAMETHASONE SODIUM PHOSPHATE 10 MG/ML IJ SOLN
INTRAMUSCULAR | Status: AC
  Filled 2022-06-30: qty 1

## 2022-06-30 MED ORDER — OXYCODONE HCL 5 MG PO TABS: 5.0000 mg | ORAL_TABLET | Freq: Once | ORAL | Status: DC | PRN

## 2022-06-30 MED ORDER — FENTANYL CITRATE (PF) 250 MCG/5ML IJ SOLN
INTRAMUSCULAR | Status: AC
  Filled 2022-06-30: qty 5

## 2022-06-30 MED ORDER — BUPIVACAINE LIPOSOME 1.3 % IJ SUSP
INTRAMUSCULAR | Status: DC | PRN
  Administered 2022-06-30: 20 mL

## 2022-06-30 MED ORDER — ACETAMINOPHEN 10 MG/ML IV SOLN
INTRAVENOUS | Status: AC
  Filled 2022-06-30: qty 100

## 2022-06-30 MED ORDER — BUPIVACAINE LIPOSOME 1.3 % IJ SUSP
INTRAMUSCULAR | Status: AC
  Filled 2022-06-30: qty 20

## 2022-06-30 MED ORDER — ENOXAPARIN SODIUM 40 MG/0.4ML IJ SOSY
40.0000 mg | PREFILLED_SYRINGE | INTRAMUSCULAR | Status: DC
  Administered 2022-07-01 – 2022-07-04 (×4): 40 mg via SUBCUTANEOUS
  Filled 2022-06-30 (×4): qty 0.4

## 2022-06-30 MED ORDER — PROPOFOL 10 MG/ML IV BOLUS
INTRAVENOUS | Status: DC | PRN
  Administered 2022-06-30: 100 mg via INTRAVENOUS
  Administered 2022-06-30 (×2): 20 mg via INTRAVENOUS

## 2022-06-30 MED ORDER — PHENYLEPHRINE 80 MCG/ML (10ML) SYRINGE FOR IV PUSH (FOR BLOOD PRESSURE SUPPORT)
PREFILLED_SYRINGE | INTRAVENOUS | Status: AC
  Filled 2022-06-30: qty 10

## 2022-06-30 MED ORDER — LIDOCAINE 5 % EX PTCH
1.0000 | MEDICATED_PATCH | CUTANEOUS | Status: DC
  Administered 2022-06-30 – 2022-07-03 (×2): 1 via TRANSDERMAL
  Filled 2022-06-30 (×3): qty 1

## 2022-06-30 MED ORDER — ACETAMINOPHEN 10 MG/ML IV SOLN
INTRAVENOUS | Status: DC | PRN
  Administered 2022-06-30: 1000 mg via INTRAVENOUS

## 2022-06-30 MED ORDER — 0.9 % SODIUM CHLORIDE (POUR BTL) OPTIME
TOPICAL | Status: DC | PRN
  Administered 2022-06-30: 2000 mL

## 2022-06-30 MED ORDER — SUCCINYLCHOLINE CHLORIDE 200 MG/10ML IV SOSY
PREFILLED_SYRINGE | INTRAVENOUS | Status: AC
  Filled 2022-06-30: qty 10

## 2022-06-30 MED ORDER — PHENYLEPHRINE HCL-NACL 20-0.9 MG/250ML-% IV SOLN
INTRAVENOUS | Status: DC | PRN
  Administered 2022-06-30: 30 ug/min via INTRAVENOUS

## 2022-06-30 MED ORDER — LIDOCAINE 2% (20 MG/ML) 5 ML SYRINGE
INTRAMUSCULAR | Status: DC | PRN
  Administered 2022-06-30: 20 mg via INTRAVENOUS

## 2022-06-30 MED ORDER — SUCCINYLCHOLINE CHLORIDE 200 MG/10ML IV SOSY
PREFILLED_SYRINGE | INTRAVENOUS | Status: DC | PRN
  Administered 2022-06-30: 100 mg via INTRAVENOUS

## 2022-06-30 MED ORDER — ONDANSETRON HCL 4 MG/2ML IJ SOLN
INTRAMUSCULAR | Status: AC
  Filled 2022-06-30: qty 2

## 2022-06-30 MED ORDER — ONDANSETRON HCL 4 MG/2ML IJ SOLN
INTRAMUSCULAR | Status: DC | PRN
  Administered 2022-06-30: 4 mg via INTRAVENOUS

## 2022-06-30 MED ORDER — MEPERIDINE HCL 25 MG/ML IJ SOLN: 6.2500 mg | INTRAMUSCULAR | Status: DC | PRN

## 2022-06-30 MED ORDER — ACETAMINOPHEN 500 MG PO TABS
1000.0000 mg | ORAL_TABLET | Freq: Three times a day (TID) | ORAL | Status: DC
  Administered 2022-06-30 – 2022-07-02 (×6): 1000 mg via ORAL
  Filled 2022-06-30 (×7): qty 2

## 2022-06-30 MED ORDER — LACTATED RINGERS IV SOLN: INTRAVENOUS | Status: DC

## 2022-06-30 MED ORDER — FENTANYL CITRATE (PF) 100 MCG/2ML IJ SOLN: 25.0000 ug | INTRAMUSCULAR | Status: DC | PRN

## 2022-06-30 MED ORDER — DEXAMETHASONE SODIUM PHOSPHATE 10 MG/ML IJ SOLN
INTRAMUSCULAR | Status: DC | PRN
  Administered 2022-06-30: 10 mg via INTRAVENOUS

## 2022-06-30 MED ORDER — BUPIVACAINE-EPINEPHRINE 0.25% -1:200000 IJ SOLN
INTRAMUSCULAR | Status: DC | PRN
  Administered 2022-06-30: 5 mL

## 2022-06-30 MED ORDER — TRAMADOL HCL 50 MG PO TABS
50.0000 mg | ORAL_TABLET | Freq: Four times a day (QID) | ORAL | Status: DC | PRN
  Administered 2022-06-30: 50 mg via ORAL
  Filled 2022-06-30: qty 1

## 2022-06-30 MED ORDER — FENTANYL CITRATE (PF) 250 MCG/5ML IJ SOLN
INTRAMUSCULAR | Status: DC | PRN
  Administered 2022-06-30: 100 ug via INTRAVENOUS
  Administered 2022-06-30 (×2): 50 ug via INTRAVENOUS

## 2022-06-30 MED ORDER — PHENYLEPHRINE 80 MCG/ML (10ML) SYRINGE FOR IV PUSH (FOR BLOOD PRESSURE SUPPORT)
PREFILLED_SYRINGE | INTRAVENOUS | Status: DC | PRN
  Administered 2022-06-30: 80 ug via INTRAVENOUS

## 2022-06-30 MED ORDER — CHLORHEXIDINE GLUCONATE 0.12 % MT SOLN: 15.0000 mL | Freq: Once | OROMUCOSAL | Status: AC

## 2022-06-30 MED ORDER — PROPOFOL 10 MG/ML IV BOLUS
INTRAVENOUS | Status: AC
  Filled 2022-06-30: qty 20

## 2022-06-30 SURGICAL SUPPLY — 68 items
APL PRP STRL LF DISP 70% ISPRP (MISCELLANEOUS) ×1
APL SKNCLS STERI-STRIP NONHPOA (GAUZE/BANDAGES/DRESSINGS)
BAG COUNTER SPONGE SURGICOUNT (BAG) ×1
BAG SPNG CNTER NS LX DISP (BAG) ×1
BENZOIN TINCTURE PRP APPL 2/3 (GAUZE/BANDAGES/DRESSINGS)
BLADE CLIPPER SURG (BLADE) ×1
BLADE SURG 10 STRL SS (BLADE) ×1
CANISTER SUCT 3000ML PPV (MISCELLANEOUS) ×1
CHLORAPREP W/TINT 26 (MISCELLANEOUS) ×1
COVER SURGICAL LIGHT HANDLE (MISCELLANEOUS) ×2
DRAPE HALF SHEET 40X57 (DRAPES) ×2
DRAPE LAPAROSCOPIC ABDOMINAL (DRAPES) ×1
DRAPE UTILITY XL STRL (DRAPES) ×1
DRAPE WARM FLUID 44X44 (DRAPES)
DRSG OPSITE POSTOP 4X10 (GAUZE/BANDAGES/DRESSINGS)
DRSG OPSITE POSTOP 4X8 (GAUZE/BANDAGES/DRESSINGS) ×1
DRSG TEGADERM 2-3/8X2-3/4 SM (GAUZE/BANDAGES/DRESSINGS) ×3
DRSG TEGADERM 4X4.75 (GAUZE/BANDAGES/DRESSINGS)
ELECT BLADE 6.5 EXT (BLADE) ×1
ELECT CAUTERY BLADE 6.4 (BLADE) ×1
ELECT REM PT RETURN 9FT ADLT (ELECTROSURGICAL) ×1
GAUZE SPONGE 2X2 8PLY STRL LF (GAUZE/BANDAGES/DRESSINGS) ×1
GLOVE BIO SURGEON STRL SZ7 (GLOVE) ×2
GLOVE BIO SURGEON STRL SZ8 (GLOVE) ×1
GLOVE BIOGEL PI IND STRL 7.5 (GLOVE) ×2
GLOVE BIOGEL PI IND STRL 8 (GLOVE) ×1
GLOVE BIOGEL PI MICRO STRL 6 (GLOVE) ×2
GOWN STRL REUS W/ TWL LRG LVL3 (GOWN DISPOSABLE) ×3
GOWN STRL REUS W/TWL LRG LVL3 (GOWN DISPOSABLE) ×3
IRRIG SUCT STRYKERFLOW 2 WTIP (MISCELLANEOUS)
KIT BASIN OR (CUSTOM PROCEDURE TRAY) ×1
KIT TURNOVER KIT B (KITS) ×1
LIGASURE IMPACT 36 18CM CVD LR (INSTRUMENTS) ×1
NS IRRIG 1000ML POUR BTL (IV SOLUTION) ×2
PACK GENERAL/GYN (CUSTOM PROCEDURE TRAY)
PAD ARMBOARD 7.5X6 YLW CONV (MISCELLANEOUS) ×2
PENCIL BUTTON HOLSTER BLD 10FT (ELECTRODE) ×1
PENCIL SMOKE EVACUATOR (MISCELLANEOUS) ×1
RELOAD PROXIMATE 75MM BLUE (ENDOMECHANICALS) ×2 IMPLANT
SCISSORS LAP 5X35 DISP (ENDOMECHANICALS) ×1
SET TUBE SMOKE EVAC HIGH FLOW (TUBING) ×1
SLEEVE Z-THREAD 5X100MM (TROCAR) ×2
SPECIMEN JAR LARGE (MISCELLANEOUS) ×1
SPONGE T-LAP 18X18 ~~LOC~~+RFID (SPONGE) ×2
STAPLER GUN LINEAR PROX 60 (STAPLE) ×1
STAPLER PROXIMATE 75MM BLUE (STAPLE) ×1
STAPLER VISISTAT 35W (STAPLE) ×1
STRIP CLOSURE SKIN 1/2X4 (GAUZE/BANDAGES/DRESSINGS)
SUCTION POOLE HANDLE (INSTRUMENTS) ×1
SUT MNCRL AB 4-0 PS2 18 (SUTURE) ×1
SUT PDS AB 1 CT  36 (SUTURE) ×2
SUT PDS AB 1 CT 36 (SUTURE) ×2
SUT PDS AB 1 TP1 96 (SUTURE) ×2
SUT SILK 2 0 SH CR/8 (SUTURE) ×1
SUT SILK 2 0 TIES 10X30 (SUTURE)
SUT SILK 3 0 SH CR/8 (SUTURE) ×1
SUT SILK 3 0 TIES 10X30 (SUTURE)
SUT VIC AB 3-0 SH 18 (SUTURE)
SYR CONTROL 10ML LL (SYRINGE) ×2
TOWEL GREEN STERILE (TOWEL DISPOSABLE) ×1
TOWEL GREEN STERILE FF (TOWEL DISPOSABLE) ×1
TRAY FOLEY MTR SLVR 16FR STAT (SET/KITS/TRAYS/PACK) ×1
TRAY LAPAROSCOPIC MC (CUSTOM PROCEDURE TRAY) ×1
TROCAR BALLN 12MMX100 BLUNT (TROCAR)
TROCAR Z-THREAD OPTICAL 5X100M (TROCAR) ×1
TUBE CONNECTING 12X1/4 (SUCTIONS) ×1
WARMER LAPAROSCOPE (MISCELLANEOUS) ×1
YANKAUER SUCT BULB TIP NO VENT (SUCTIONS) ×1

## 2022-06-30 NOTE — Anesthesia Procedure Notes (Signed)
Procedure Name: Intubation Date/Time: 06/30/2022 9:38 AM  Performed by: Nils Pyle, CRNAPre-anesthesia Checklist: Patient identified, Emergency Drugs available, Suction available and Patient being monitored Patient Re-evaluated:Patient Re-evaluated prior to induction Oxygen Delivery Method: Circle System Utilized Preoxygenation: Pre-oxygenation with 100% oxygen Induction Type: IV induction Ventilation: Mask ventilation without difficulty Laryngoscope Size: Miller and 2 Grade View: Grade I Tube type: Oral Tube size: 7.5 mm Number of attempts: 1 Airway Equipment and Method: Stylet and Oral airway Placement Confirmation: ETT inserted through vocal cords under direct vision, positive ETCO2 and breath sounds checked- equal and bilateral Secured at: 23 cm Tube secured with: Tape Dental Injury: Teeth and Oropharynx as per pre-operative assessment

## 2022-06-30 NOTE — TOC Progression Note (Signed)
Transition of Care (TOC) - Progression Note   NCM faxed update to patient's [facility] PCP  Janace Aris fax [phone]  Patient Details  Name: Austin [name] MRN: [id number] Date of Birth: Jan 21, 1941  Transition of Care [facility]) CM/SW Contact  Nadene Rubins, Adria Devon, RN Phone Number: 06/30/2022, 10:02 AM  Clinical Narrative:       Expected Discharge Plan: Home/Self Care Barriers to Discharge: Continued Medical Work up  Expected Discharge Plan and Services   Discharge Planning Services: CM Consult   Living arrangements for the past 2 months: Single Family Home                                       Social Determinants of Health (SDOH) Interventions SDOH Screenings   Food Insecurity: No Food Insecurity (06/26/2022)  Housing: Low Risk  (06/26/2022)  Transportation Needs: No Transportation Needs (06/26/2022)  Utilities: Not At Risk (06/26/2022)  Tobacco Use: Medium Risk (06/30/2022)    Readmission Risk Interventions     No data to display

## 2022-06-30 NOTE — Anesthesia Postprocedure Evaluation (Signed)
Anesthesia Post Note  Patient: Austin [name]  Procedure(s) Performed: LAPAROSCOPY DIAGNOSTIC (Abdomen) EXPLORATORY LAPAROTOMY, RIGHT HEMICOLECTOMY, PERITONEAL BIOPSY (Abdomen)     Patient location during evaluation: PACU Anesthesia Type: General Level of consciousness: awake and alert, oriented and patient cooperative Pain management: pain level controlled Vital Signs Assessment: post-procedure vital signs reviewed and stable Respiratory status: spontaneous breathing, nonlabored ventilation, respiratory function stable and patient connected to nasal cannula oxygen Cardiovascular status: blood pressure returned to baseline and stable Postop Assessment: no apparent nausea or vomiting Anesthetic complications: no   No notable events documented.  Last Vitals:  Vitals:   06/30/22 1215 06/30/22 1355  BP: (!) 189/72 (!) 181/68  Pulse: (!) 54 (!) 57  Resp: 17 16  Temp: 36.5 C 36.6 C  SpO2:  92%    Last Pain:  Vitals:   06/30/22 1355  TempSrc: Oral  PainSc:                  Maleigh Bagot,E. Kaylin Marcon

## 2022-06-30 NOTE — Anesthesia Preprocedure Evaluation (Addendum)
Anesthesia Evaluation  Patient identified by MRN, date of birth, ID band Patient awake    Reviewed: Allergy & Precautions, NPO status , Patient's Chart, lab work & pertinent test results, reviewed documented beta blocker date and time   History of Anesthesia Complications Negative for: history of anesthetic complications  Airway Mallampati: I  TM Distance: >3 FB Neck ROM: Full    Dental  (+) Edentulous Upper, Edentulous Lower   Pulmonary COPD,  COPD inhaler, former smoker   breath sounds clear to auscultation       Cardiovascular hypertension, Pt. on medications and Pt. 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 He returned to the lab that admission for planned staged DES to the OM1 and proximal Cx. '14 2D Echo showed EF 50-55%, grade 1 DD. '18 exercise stress test at the [facility] without ischemia   Neuro/Psych negative neurological ROS     GI/Hepatic Neg liver ROS,GERD  Medicated and Controlled,,Ileal mass: partial SBO N/v   Endo/Other  negative endocrine ROS    Renal/GU Renal InsufficiencyRenal disease     Musculoskeletal   Abdominal   Peds  Hematology negative hematology ROS (+)   Anesthesia Other Findings   Reproductive/Obstetrics                             Anesthesia Physical Anesthesia Plan  ASA: 3  Anesthesia Plan: General   Post-op Pain Management: Ofirmev IV (intra-op)*   Induction: Intravenous and Rapid sequence  PONV Risk Score and Plan: 2 and Ondansetron and Dexamethasone  Airway Management Planned: Oral ETT  Additional Equipment: None  Intra-op Plan:   Post-operative Plan: Extubation in OR  Informed Consent: I have reviewed the patients History and Physical, chart, labs and discussed the procedure including the risks, benefits and alternatives for the proposed  anesthesia with the patient or authorized representative who has indicated his/her understanding and acceptance.       Plan Discussed with: CRNA and Surgeon  Anesthesia Plan Comments:         Anesthesia Quick Evaluation

## 2022-06-30 NOTE — Transfer of Care (Signed)
Immediate Anesthesia Transfer of Care Note  Patient: Austin [name]  Procedure(s) Performed: LAPAROSCOPY DIAGNOSTIC (Abdomen) EXPLORATORY LAPAROTOMY, RIGHT HEMICOLECTOMY, PERITONEAL BIOPSY (Abdomen)  Patient Location: PACU  Anesthesia Type:General  Level of Consciousness: awake and drowsy  Airway & Oxygen Therapy: Patient Spontanous Breathing  Post-op Assessment: Report given to RN and Post -op Vital signs reviewed and stable  Post vital signs: Reviewed and stable  Last Vitals:  Vitals Value Taken Time  BP 190/71 06/30/22 1142  Temp    Pulse 53 06/30/22 1144  Resp 17 06/30/22 1144  SpO2 97 % 06/30/22 1144  Vitals shown include unvalidated device data.  Last Pain:  Vitals:   06/30/22 0851  TempSrc: Oral  PainSc: 0-No pain         Complications: No notable events documented.

## 2022-06-30 NOTE — Progress Notes (Addendum)
PROGRESS NOTE    Austin [name]  ZOX:[id number] DOB: 21-Nov-1940 DOA: 06/26/2022 PCP: [facility]    Brief Narrative:   Patient is a 82 year old male with past medical history of previous small bowel obstruction, hypertension, COPD, diastolic CHF and CAD who presented to the emergency room with nausea, vomiting, diarrhea and left-sided abdominal pain  for 2 days similar to previous bowel obstruction.  In the ED, patient was slightly hypertensive.  CBC within normal range including lipase.  CT scan of the abdomen and pelvis however showed small bowel obstruction with transition point and diverticulosis.  General surgery was consulted and patient was then admitted to the hospital for further evaluation and treatment.  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  General surgery to follow.  Follow surgical biopsy.  Bladder mass.  Patient tells that he did have previous CT imaging at the [place] and had bladder finding at that time as well.  He feels like cystoscopy was done at that time.  Urology has seen the patient at this time and recommend outpatient follow-up in the clinic.    COPD: Appears stable.   Diastolic CHF: Considered.  Normal BNP.  Severe protein calorie  malnutrition:  Nutrition Status: Nutrition Problem: Severe Malnutrition Etiology: chronic illness Signs/Symptoms: severe muscle depletion, severe fat depletion, percent weight loss Percent weight loss: 33 % Interventions: Refer to RD note for recommendations Body mass index is 16.67 kg/m.  Reported 60 pound weight loss.  Likely secondary to peritoneal carcinomatosis.  TSH at 1.6.  PSA within normal range.     History of diverticulosis: No evidence of diverticulitis at this time..    DVT prophylaxis: enoxaparin (LOVENOX) injection 40 mg Start: 07/01/22 1000 SCDs Start: 06/26/22 1531   Code Status:     Code Status: Full Code  Disposition: Uncertain at this time.  Status is: Inpatient  Remains inpatient appropriate because: Bowel obstruction, status post diagnostic laparoscopic with exploratory laparotomy.   Family Communication: Spoke with the patient's spouse at and sons at bedside on 06/30/2022.  Spoke about the surgical findings.  Consultants:  General surgery. GI and  urology  Procedures:  status post exploratory laparotomy with right hemicolectomy and peritoneal biopsy on 06/30/2022  Antimicrobials:  None  Anti-infectives (From admission, onward)    Start     Dose/Rate Route Frequency Ordered Stop   06/30/22 0800  ceFAZolin (ANCEF) IVPB 2g/100 mL premix  Status:  Discontinued       See Hyperspace for full Linked Orders Report.   2 g 200 mL/hr over 30 Minutes Intravenous To ShortStay Surgical 06/29/22 1000 06/29/22 1436   06/30/22 0800  metroNIDAZOLE (FLAGYL) IVPB 500 mg  Status:  Discontinued       See Hyperspace for full Linked Orders Report.   500 mg 100 mL/hr over 60 Minutes Intravenous To ShortStay Surgical 06/29/22 1000 06/29/22 1436   06/30/22 0600  cefTRIAXone (ROCEPHIN) 2 g in sodium  chloride 0.9 % 100 mL IVPB       See Hyperspace for full Linked Orders Report.   2 g 200 mL/hr over 30 Minutes Intravenous On call to O.R. 06/29/22 1436 06/30/22 1000    06/30/22 0600  metroNIDAZOLE (FLAGYL) IVPB 500 mg       See Hyperspace for full Linked Orders Report.   500 mg 100 mL/hr over 60 Minutes Intravenous On call to O.R. 06/29/22 1436 06/30/22 1030      Subjective: Today, patient was seen and examined in bedside after surgical intervention.  Complains of mild pain.  Patient's family at bedside.  Intermittently confused after surgery.  Objective: Vitals:   06/30/22 0851 06/30/22 1145 06/30/22 1200 06/30/22 1215  BP: (!) 140/75 (!) 189/75 (!) 187/79 (!) 189/72  Pulse: (!) 59 (!) 54 (!) 55 (!) 54  Resp: 18 19 17 17   Temp: (!) 97.5 F (36.4 C) 97.7 F (36.5 C)  97.7 F (36.5 C)  TempSrc: Oral     SpO2: 97% 98% 97%   Weight:      Height:        Intake/Output Summary (Last 24 hours) at 06/30/2022 1318 Last data filed at 06/30/2022 1117 Gross per 24 hour  Intake 1540 ml  Output 450 ml  Net 1090 ml    Filed Weights   06/28/22 0500 06/29/22 0500 06/30/22 0500  Weight: 52.8 kg 52.4 kg 52.7 kg    Physical Examination: Body mass index is 16.67 kg/m.   General: Thinly built, not in obvious distress, alert awake and Communicative HENT:   No scleral pallor or icterus noted. Oral mucosa is moist.  Chest:    Diminished breath sounds bilaterally. No crackles or wheezes.  CVS: S1 &S2 heard. No murmur.  Regular rate and rhythm. Abdomen: Soft, appropriately tender, status post laparotomy. Urethral catheter in place. Extremities: No cyanosis, clubbing or edema.   Psych: Alert, awake and mildly confused, CNS:  No cranial nerve deficits.  Power equal in all extremities.   Skin: Warm and dry.  No rashes noted.  Data Reviewed:   CBC: Recent Labs  Lab 06/26/22 1259 06/27/22 0107 06/29/22 1051  WBC 10.5 11.2* 6.9  NEUTROABS 8.1*  --   --   HGB 14.2 12.2* 14.0  HCT 43.3 35.8* 41.8  MCV 93.7 94.0 92.5  PLT 312 257 304     Basic Metabolic Panel: Recent Labs  Lab 06/26/22 1259 06/27/22 0107 06/29/22 1051 06/30/22 0743  NA 135  137 136  --   K 4.3 3.8 4.0  --   CL 96* 100 99  --   CO2 30 29 26   --   GLUCOSE 120* 103* 96  --   BUN 19 19 13   --   CREATININE 1.06 0.94 0.92  --   CALCIUM 9.3 8.4* 9.0  --   MG  --   --   --  2.0     Liver Function Tests: Recent Labs  Lab 06/26/22 1259 06/27/22 0107 06/29/22 1051  AST 18 15 20   ALT 16 15 17   ALKPHOS 77 63 78  BILITOT 0.6 0.8 0.6  PROT 6.8 5.1* 6.3*  ALBUMIN 4.0 2.9* 3.7      Radiology Studies: No results found.    LOS: 3 days    Joycelyn Das, MD [facility] Available via Epic secure chat 7am-7pm After these hours, please refer to coverage provider listed on [email or web address] 06/30/2022, 1:18 PM

## 2022-06-30 NOTE — Op Note (Addendum)
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  Procedure performed: Diagnostic laparoscopy, exploratory laparotomy, right hemicolectomy, peritoneal biopsy Surgeon:Keldric Poyer K Leelan Rajewski Assistant: Dr. Violeta Gelinas Anesthesia: General Indications: This is an 82 year old male who presents with several months of intermittent abdominal discomfort and significant weight loss.  He has had some nonbloody diarrhea.  He has undergone a thorough workup including upper endoscopy as well as colonoscopy.  The colonoscopy was performed in December 2023 at [facility].  He presented several days ago with signs of a bowel obstruction.  However, he regained bowel function fairly quickly.  CT scan showed a area of thickening in the terminal ileum just before the cecum.  There is also some peritoneal soft tissue thickening suspicious for carcinomatosis.  We recommended diagnostic laparoscopy and possible bowel resection.  Description of procedure: The patient is brought to the operating room and placed in the supine position on the operating room table.  After an adequate level general anesthesia was obtained, a Foley catheter was placed under sterile technique.  The patient's abdomen was prepped with ChloraPrep and draped sterile fashion.  A timeout was taken to ensure the proper patient and proper procedure.  We infiltrated the area just below the umbilicus with local anesthetic.  I made a 1 cm vertical incision in this area.  We dissected down to the fascia.  We incised the fascia vertically.  We entered the peritoneal cavity bluntly.  A stay suture of 0 Vicryl was placed around the fascial opening.  The Hassan cannula was inserted and secured with the stay suture.  We insufflated CO2 maintaining a maximum pressure of 15 mmHg.  The laparoscope was inserted and we examined the  peritoneal cavity.  The patient has widespread seeding of white peritoneal implants throughout the lower abdomen and pelvis.  There are implants along the visualized portions of the small bowel mesentery.  We placed 2 additional 5 mm ports in the epigastrium and in the left lower quadrant.  I mobilized the right colon by dividing some the lateral attachments with cautery.  The terminal ileum shows a very obvious mass that is densely adherent to the sidewall.  I made the decision to convert to an open procedure to allow resection of this area secondary to impending obstruction.       We released our insufflation and remove the trocars.  I extended his infraumbilical incision down towards the symphysis pubis.  We entered the peritoneal cavity with cautery.  I excised an area of peritoneum containing several implants and sent this for frozen section.  This later returned a diagnosis of moderately differentiated mucinous adenocarcinoma.  With continued mobilizing the right colon and the terminal ileum on the pelvic sidewall.  We took down the hepatic flexure.  We divided the terminal ileum proximal to the near obstructing mass with a GIA 75.  We divided the right colon proximal to the hepatic flexure with another load of the GIA 75.  The mesentery was taken with the LigaSure.  We created a side-to-side anastomosis of the terminal ileum and the distal right colon with another load of the GIA 75 and a TX 60 stapler.  The crotch of the anastomosis was reinforced with 3-0 silk.  The anastomosis was palpated and was widely patent.  We inspected for hemostasis.  We irrigated the abdomen thoroughly.  The omentum was placed over the anastomosis.  Our sponge  count was correct.  We then changed gowns and gloves in accordance with the colon protocol.  I injected Exparel into the patient's fascia to aid in postoperative pain control.  The fascia was reapproximated with #1 PDS.  We inspected for hemostasis in the  subcutaneous tissues.  The skin was closed with staples.  We closed the 2 port sites with staples.  Dry dressings were applied.  The patient was then extubated and brought to recovery room in stable condition.  All sponge, instrument, and needle counts are correct.  Wilmon Arms. Corliss Skains, MD, [facility] Surgery  General Surgery   06/30/2022 11:46 AM

## 2022-06-30 NOTE — Interval H&P Note (Signed)
History and Physical Interval Note:  06/30/2022 8:57 AM  Austin [name]  has presented today for surgery, with the diagnosis of Partial bowel obstruction iteminal, ileal mass.  The various methods of treatment have been discussed with the patient and family. After consideration of risks, benefits and other options for treatment, the patient has consented to  Procedure(s): LAPAROSCOPY DIAGNOSTIC (N/A) POSSIBLE EXPLORATORY LAPAROTOMY POSSIBLE BOWEL RESECTION (N/A) as a surgical intervention.  The patient's history has been reviewed, patient examined, no change in status, stable for surgery.  I have reviewed the patient's chart and labs.  Questions were answered to the patient's satisfaction.     [name]

## 2022-07-01 ENCOUNTER — Encounter (HOSPITAL_COMMUNITY): Payer: Self-pay | Admitting: Surgery

## 2022-07-01 DIAGNOSIS — I2510 Atherosclerotic heart disease of native coronary artery without angina pectoris: Secondary | ICD-10-CM | POA: Diagnosis not present

## 2022-07-01 DIAGNOSIS — I5032 Chronic diastolic (congestive) heart failure: Secondary | ICD-10-CM | POA: Diagnosis not present

## 2022-07-01 DIAGNOSIS — J439 Emphysema, unspecified: Secondary | ICD-10-CM | POA: Diagnosis not present

## 2022-07-01 DIAGNOSIS — K56609 Unspecified intestinal obstruction, unspecified as to partial versus complete obstruction: Secondary | ICD-10-CM | POA: Diagnosis not present

## 2022-07-01 LAB — COMPREHENSIVE METABOLIC PANEL
ALT: 16 U/L (ref 0–44)
AST: 19 U/L (ref 15–41)
Albumin: 3 g/dL — ABNORMAL LOW (ref 3.5–5.0)
Alkaline Phosphatase: 68 U/L (ref 38–126)
Anion gap: 10 (ref 5–15)
BUN: 16 mg/dL (ref 8–23)
CO2: 25 mmol/L (ref 22–32)
Calcium: 8.6 mg/dL — ABNORMAL LOW (ref 8.9–10.3)
Chloride: 100 mmol/L (ref 98–111)
Creatinine, Ser: 1.11 mg/dL (ref 0.61–1.24)
GFR, Estimated: >60 mL/min (ref >60)
Glucose, Bld: 126 mg/dL — ABNORMAL HIGH (ref 70–99)
Potassium: 4.8 mmol/L (ref 3.5–5.1)
Sodium: 135 mmol/L (ref 135–145)
Total Bilirubin: 0.7 mg/dL (ref 0.3–1.2)
Total Protein: 5.5 g/dL — ABNORMAL LOW (ref 6.5–8.1)

## 2022-07-01 LAB — CBC
HCT: 37.5 % — ABNORMAL LOW (ref 39.0–52.0)
Hemoglobin: 12.6 g/dL — ABNORMAL LOW (ref 13.0–17.0)
MCH: 31.7 pg (ref 26.0–34.0)
MCHC: 33.6 g/dL (ref 30.0–36.0)
MCV: 94.2 fL (ref 80.0–100.0)
Platelets: 268 10*3/uL (ref 150–400)
RBC: 3.98 MIL/uL — ABNORMAL LOW (ref 4.22–5.81)
RDW: 13.2 % (ref 11.5–15.5)
WBC: 16.9 10*3/uL — ABNORMAL HIGH (ref 4.0–10.5)
nRBC: 0 % (ref 0.0–0.2)

## 2022-07-01 LAB — MAGNESIUM: Magnesium: 1.9 mg/dL (ref 1.7–2.4)

## 2022-07-01 MED ORDER — CHLORHEXIDINE GLUCONATE CLOTH 2 % EX PADS: 6.0000 | MEDICATED_PAD | Freq: Every day | CUTANEOUS | Status: DC

## 2022-07-01 MED ORDER — ENSURE ENLIVE PO LIQD
237.0000 mL | Freq: Two times a day (BID) | ORAL | Status: DC
  Administered 2022-07-02 – 2022-07-04 (×3): 237 mL via ORAL

## 2022-07-01 NOTE — Plan of Care (Signed)

## 2022-07-01 NOTE — Progress Notes (Addendum)
PROGRESS NOTE    Austin [name]  NWG:[id number] DOB: 01-08-1941 DOA: 06/26/2022 PCP: [facility]    Brief Narrative:   Patient is a 82 year old male with past medical history of previous small bowel obstruction, hypertension, COPD, diastolic CHF and CAD who presented to the emergency room with nausea, vomiting, diarrhea and left-sided abdominal pain  for 2 days similar to previous bowel obstruction.  In the ED, patient was slightly hypertensive.  CBC within normal range including lipase.  CT scan of the abdomen and pelvis however showed small bowel obstruction with transition point and diverticulosis.  General surgery was consulted and patient was then admitted to the hospital for further evaluation and treatment.  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  General surgery on board and has advance diet to full liquids today.  The operative biopsy shows moderately differentiated mucinous adenocarcinoma.  CEA in process.  Surgical pathology pending.  Advised ambulation.  GI pathogen panel negative.  Still has a leukocytosis at 16.9.  Will continue to monitor closely.  Bladder mass.  Patient tells that he did have previous CT imaging at the [place] and had  bladder finding at that time as well.  He feels like cystoscopy was done at that time.  Urology has seen the patient at this time and recommend outpatient follow-up in the clinic.    COPD: Appears stable.  Has mild cough.   Diastolic CHF: Considered.  Normal BNP.  No dyspnea  Severe protein calorie malnutrition:  Nutrition Status: Nutrition Problem: Severe Malnutrition Etiology: chronic illness Signs/Symptoms: severe muscle depletion, severe fat depletion, percent weight loss Percent weight loss: 33 % Interventions: Refer to RD note for recommendations Body mass index is 16.61 kg/m.  Reported 60 pound weight loss.  Likely secondary to peritoneal carcinomatosis.  TSH at 1.6.  PSA within normal range.     History of diverticulosis: No evidence of diverticulitis at this time..    DVT prophylaxis: enoxaparin (LOVENOX) injection 40 mg Start: 07/01/22 1000 SCDs Start: 06/26/22 1531   Code Status:     Code Status: Full Code  Disposition: Uncertain at this time.   Likely home on discharge. Will get PT evaluation.   Status is: Inpatient  Remains inpatient appropriate because: Bowel obstruction, status post diagnostic laparoscopy with exploratory laparotomy.   Family Communication:  Spoke with the patient's spouse at and sons at bedside on 06/30/2022.   Consultants:  General surgery. GI and  urology  Procedures:  status post exploratory laparotomy with right hemicolectomy and peritoneal biopsy on 06/30/2022  Antimicrobials:  None   Subjective: Today, patient was seen and examined at bedside. Had 1 episode of abdominal pain and complains of mild cough but no nausea vomiting fever chills  Objective: Vitals:   06/30/22 2349 07/01/22 0407 07/01/22 0446 07/01/22 0817  BP: (!) 140/72 (!) 164/97  (!) 167/77  Pulse: 69 82  68  Resp: 16 18  18  Temp: 97.9 F (36.6 C) 98.2 F (36.8 C)  97.8 F (36.6 C)  TempSrc: Oral Tympanic    SpO2: 97% 93%  96%  Weight:   52.5 kg   Height:         Intake/Output Summary (Last 24 hours) at 07/01/2022 1030 Last data filed at 07/01/2022 0417 Gross per 24 hour  Intake 1340 ml  Output 800 ml  Net 540 ml    Filed Weights   06/29/22 0500 06/30/22 0500 07/01/22 0446  Weight: 52.4 kg 52.7 kg 52.5 kg    Physical Examination: Body mass index is 16.61 kg/m.   General: Thinly built, not in obvious distress, alert awake and Communicative HENT:   No scleral pallor or icterus noted. Oral mucosa is moist.  Chest:    Diminished breath sounds bilaterally. No crackles or wheezes.  CVS: S1 &S2 heard. No murmur.  Regular rate and rhythm. Abdomen: Soft, appropriately tender, status post laparotomy with scar. Urethral catheter in place. Extremities: No cyanosis, clubbing or edema.   Psych: Alert, awake and mildly confused, CNS:  No cranial nerve deficits.  Power equal in all extremities.   Skin: Warm and dry.  No rashes noted.  Data Reviewed:   CBC: Recent Labs  Lab 06/26/22 1259 06/27/22 0107 06/29/22 1051 07/01/22 0023  WBC 10.5 11.2* 6.9 16.9*  NEUTROABS 8.1*  --   --   --   HGB 14.2 12.2* 14.0 12.6*  HCT 43.3 35.8* 41.8 37.5*  MCV 93.7 94.0 92.5 94.2  PLT 312 257 304 268     Basic Metabolic Panel: Recent Labs  Lab 06/26/22 1259 06/27/22 0107 06/29/22 1051 06/30/22 0743 07/01/22 0023  NA 135 137 136  --  135  K 4.3 3.8 4.0  --  4.8  CL 96* 100 99  --  100  CO2 30 29 26   --  25  GLUCOSE 120* 103* 96  --  126*  BUN 19 19 13   --  16  CREATININE 1.06 0.94 0.92  --  1.11  CALCIUM 9.3 8.4* 9.0  --  8.6*  MG  --   --   --  2.0 1.9     Liver Function Tests: Recent Labs  Lab 06/26/22 1259 06/27/22 0107 06/29/22 1051 07/01/22 0023  AST 18 15 20 19   ALT 16 15 17 16   ALKPHOS 77 63 78 68  BILITOT 0.6 0.8 0.6 0.7  PROT 6.8 5.1* 6.3* 5.5*  ALBUMIN 4.0 2.9* 3.7 3.0*      Radiology Studies: No results found.    LOS: 4 days    Joycelyn Das, MD [facility] Available via Epic secure chat  7am-7pm After these hours, please refer to coverage provider listed on [email or web address] 07/01/2022, 10:30 AM

## 2022-07-01 NOTE — Evaluation (Signed)
Physical Therapy Evaluation Patient Details Name: Austin [name] MRN: [id number] DOB: 05-06-1940 Today's Date: 07/01/2022  History of Present Illness  Patient is a 82 year old male admitted for nausea, vomiting, diarrhea and left-sided abdominal pain  for 2 days similar to previous bowel obstruction.  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  PHM significant for previous small bowel obstruction, hypertension, COPD, diastolic CHF and CAD.  Clinical Impression  Patient independent PTA.  Currently requires supervision for mobility due to slight loss of balance during ambulation, able to self-regain.  No further PT needs identified, recommend ambulation several times/day with nursing staff, mobility specialist and family.  PT to sign off.         Recommendations for follow up therapy are one component of a multi-disciplinary discharge planning process, led by the attending physician.  Recommendations may be updated based on patient status, additional functional criteria and insurance authorization.  Follow Up Recommendations       Assistance Recommended at Discharge PRN  Patient can return home with the following       Equipment Recommendations None recommended by PT  Recommendations for Other Services       Functional Status Assessment Patient has had a recent decline in their functional status and demonstrates the ability to make significant improvements in function in a reasonable and predictable amount of time.     Precautions / Restrictions Precautions Precautions: None      Mobility  Bed Mobility Overal bed mobility: Independent                  Transfers Overall transfer level: Independent                      Ambulation/Gait Ambulation/Gait assistance: Supervision Gait  Distance (Feet): 200 Feet Assistive device: None Gait Pattern/deviations: WFL(Within Functional Limits)       General Gait Details: occasional loss of balance but able to regain independently  Stairs            Wheelchair Mobility    Modified Rankin (Stroke Patients Only)       Balance Overall balance assessment: Needs assistance Sitting-balance support: No upper extremity supported, Feet supported Sitting balance-Leahy Scale: Normal     Standing balance support: No upper extremity supported, During functional activity Standing balance-Leahy Scale: Good                               Pertinent Vitals/Pain Pain Assessment Pain Assessment: 0-10 Pain Score: 5  Pain Location: abdomen Pain Descriptors / Indicators: Discomfort, Dull, Grimacing    Home Living Family/patient expects to be discharged to:: Private residence Living Arrangements: Spouse/significant other Available Help at Discharge: Family                    Prior Function                       Hand Dominance        Extremity/Trunk Assessment   Upper Extremity Assessment Upper Extremity Assessment: Overall WFL for tasks assessed    Lower Extremity Assessment Lower Extremity Assessment: Overall WFL for tasks assessed       Communication      Cognition Arousal/Alertness: Awake/alert Behavior During Therapy: WFL for tasks assessed/performed Overall Cognitive Status:  Within Functional Limits for tasks assessed                                          General Comments      Exercises     Assessment/Plan    PT Assessment Patient does not need any further PT services  PT Problem List         PT Treatment Interventions      PT Goals (Current goals can be found in the Care Plan section)  Acute Rehab PT Goals PT Goal Formulation: All assessment and education complete, DC therapy    Frequency       Co-evaluation                AM-PAC PT "6 Clicks" Mobility  Outcome Measure Help needed turning from your back to your side while in a flat bed without using bedrails?: None Help needed moving from lying on your back to sitting on the side of a flat bed without using bedrails?: None Help needed moving to and from a bed to a chair (including a wheelchair)?: None Help needed standing up from a chair using your arms (e.g., wheelchair or bedside chair)?: None Help needed to walk in hospital room?: A Little Help needed climbing 3-5 steps with a railing? : A Little 6 Click Score: 22    End of Session   Activity Tolerance: Patient tolerated treatment well Patient left: in bed;with call bell/phone within reach;with family/visitor present   PT Visit Diagnosis: Muscle weakness (generalized) (M62.81)    Time: 2956-2130 PT Time Calculation (min) (ACUTE ONLY): 15 min   Charges:   PT Evaluation $PT Eval Low Complexity: 1 Low          07/01/2022 Margie, PT Acute Rehabilitation Services Office:  [phone]   Olivia Canter 07/01/2022, 2:40 PM

## 2022-07-01 NOTE — Progress Notes (Signed)
1 Day Post-Op   Subjective/Chief Complaint: Had one significant episode of pain but otherwise pain controlled. Episode of coughing and describes coughing up phlegm. Otherwise no nausea/emesis on CLD. Had not walked much since surgery. Wife at bedside  Objective: Vital signs in last 24 hours: Temp:  [97.5 F (36.4 C)-98.3 F (36.8 C)] 97.8 F (36.6 C) (05/18 0817) Pulse Rate:  [54-82] 68 (05/18 0817) Resp:  [16-19] 18 (05/18 0817) BP: (140-189)/(68-97) 167/77 (05/18 0817) SpO2:  [92 %-98 %] 96 % (05/18 0817) Weight:  [52.5 kg] 52.5 kg (05/18 0446) Last BM Date : 06/30/22  Intake/Output from previous day: 05/17 0701 - 05/18 0700 In: 1540 [P.O.:240; I.V.:1000; IV Piggyback:300] Out: 800 [Urine:750; Blood:50] Intake/Output this shift: No intake/output data recorded.  PE:  Constitutional: No acute distress, conversant, appears stated age. Lungs: normal respiratory effort on room air GI: Soft, ND. Incisions with honeycomb, gauze, and tegaderm. Dried blood distal portion honeycomb. Appropriate TTP Skin: No rashes, palpation reveals normal turgor Psychiatric: appropriate judgment and insight, oriented to person, place, and time   Lab Results:  Recent Labs    06/29/22 1051 07/01/22 0023  WBC 6.9 16.9*  HGB 14.0 12.6*  HCT 41.8 37.5*  PLT 304 268    BMET Recent Labs    06/29/22 1051 07/01/22 0023  NA 136 135  K 4.0 4.8  CL 99 100  CO2 26 25  GLUCOSE 96 126*  BUN 13 16  CREATININE 0.92 1.11  CALCIUM 9.0 8.6*    PT/INR No results for input(s): "LABPROT", "INR" in the last 72 hours. ABG No results for input(s): "PHART", "HCO3" in the last 72 hours.  Invalid input(s): "PCO2", "PO2"  Studies/Results: No results found.  Anti-infectives: Anti-infectives (From admission, onward)    Start     Dose/Rate Route Frequency Ordered Stop   06/30/22 0800  ceFAZolin (ANCEF) IVPB 2g/100 mL premix  Status:  Discontinued       See Hyperspace for full Linked Orders Report.    2 g 200 mL/hr over 30 Minutes Intravenous To ShortStay Surgical 06/29/22 1000 06/29/22 1436   06/30/22 0800  metroNIDAZOLE (FLAGYL) IVPB 500 mg  Status:  Discontinued       See Hyperspace for full Linked Orders Report.   500 mg 100 mL/hr over 60 Minutes Intravenous To ShortStay Surgical 06/29/22 1000 06/29/22 1436   06/30/22 0600  cefTRIAXone (ROCEPHIN) 2 g in sodium chloride 0.9 % 100 mL IVPB       See Hyperspace for full Linked Orders Report.   2 g 200 mL/hr over 30 Minutes Intravenous On call to O.R. 06/29/22 1436 06/30/22 1000   06/30/22 0600  metroNIDAZOLE (FLAGYL) IVPB 500 mg       See Hyperspace for full Linked Orders Report.   500 mg 100 mL/hr over 60 Minutes Intravenous On call to O.R. 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  - CEA in process - surgical path pending - uro consult for bladder mass - outpatient work up - GI consult for esophageal thickening - no repeat EGD at this time and signed off - I have contacted Dr. Truett Perna for oncology consult - advance to FLD/ensure. Back down to CLD vs NPO if any nausea/emesis - ambulate and IS -  PT ordered    FEN: FLD, ensure ID: rocephin/flagyl periop VTE: lovenox Foley: periop. Remove today   I reviewed hospitalist notes, last 24 h vitals and pain scores, last 48 h intake and output, last 24 h labs and trends, and last 24 h imaging results.     LOS: 4 days   Eric Form, [facility] 07/01/2022, 8:24 AM Please see Amion for pager number during day hours 7:00am-4:30pm

## 2022-07-02 DIAGNOSIS — I2510 Atherosclerotic heart disease of native coronary artery without angina pectoris: Secondary | ICD-10-CM | POA: Diagnosis not present

## 2022-07-02 DIAGNOSIS — I5032 Chronic diastolic (congestive) heart failure: Secondary | ICD-10-CM | POA: Diagnosis not present

## 2022-07-02 DIAGNOSIS — J439 Emphysema, unspecified: Secondary | ICD-10-CM | POA: Diagnosis not present

## 2022-07-02 DIAGNOSIS — K56609 Unspecified intestinal obstruction, unspecified as to partial versus complete obstruction: Secondary | ICD-10-CM | POA: Diagnosis not present

## 2022-07-02 LAB — CBC
HCT: 34.5 % — ABNORMAL LOW (ref 39.0–52.0)
Hemoglobin: 11.8 g/dL — ABNORMAL LOW (ref 13.0–17.0)
MCH: 31.4 pg (ref 26.0–34.0)
MCHC: 34.2 g/dL (ref 30.0–36.0)
MCV: 91.8 fL (ref 80.0–100.0)
Platelets: 264 10*3/uL (ref 150–400)
RBC: 3.76 MIL/uL — ABNORMAL LOW (ref 4.22–5.81)
RDW: 13.3 % (ref 11.5–15.5)
WBC: 11.1 10*3/uL — ABNORMAL HIGH (ref 4.0–10.5)
nRBC: 0 % (ref 0.0–0.2)

## 2022-07-02 LAB — MAGNESIUM: Magnesium: 2.1 mg/dL (ref 1.7–2.4)

## 2022-07-02 LAB — BASIC METABOLIC PANEL
Anion gap: 8 (ref 5–15)
BUN: 20 mg/dL (ref 8–23)
CO2: 26 mmol/L (ref 22–32)
Calcium: 8.6 mg/dL — ABNORMAL LOW (ref 8.9–10.3)
Chloride: 100 mmol/L (ref 98–111)
Creatinine, Ser: 0.99 mg/dL (ref 0.61–1.24)
GFR, Estimated: >60 mL/min (ref >60)
Glucose, Bld: 91 mg/dL (ref 70–99)
Potassium: 3.9 mmol/L (ref 3.5–5.1)
Sodium: 134 mmol/L — ABNORMAL LOW (ref 135–145)

## 2022-07-02 MED ORDER — ONDANSETRON HCL 4 MG/2ML IJ SOLN
4.0000 mg | Freq: Four times a day (QID) | INTRAMUSCULAR | Status: DC | PRN
  Administered 2022-07-02: 4 mg via INTRAVENOUS
  Filled 2022-07-02 (×2): qty 2

## 2022-07-02 NOTE — Progress Notes (Signed)
Patient noted with poor appetite at lunch, only eating a few bites. C/O nausea. PRN zofran administered and not effective. Vomiting moderate amounts of liquid with small amount of undigested food. Abdomen soft, non-distended. Passing flatus. Attending MD and surgery made aware. Instructed to educate patient to try a light or liquid diet for supper and continue to monitor.

## 2022-07-02 NOTE — Progress Notes (Signed)
2 Days Post-Op   Subjective/Chief Complaint: Having flatus and bms, tol fulls, no n/v, voiding, ambulating   Objective: Vital signs in last 24 hours: Temp:  [98 F (36.7 C)-99 F (37.2 C)] 98 F (36.7 C) (05/19 0816) Pulse Rate:  [69-80] 80 (05/19 0816) Resp:  [16-18] 17 (05/19 0816) BP: (143-178)/(75-80) 144/80 (05/19 0816) SpO2:  [93 %-96 %] 96 % (05/19 0816) Last BM Date : 06/30/22  Intake/Output from previous day: 05/18 0701 - 05/19 0700 In: -  Out: 350 [Urine:350] Intake/Output this shift: Total I/O In: 240 [P.O.:240] Out: -   Ab soft approp tender dressing dry  Lab Results:  Recent Labs    07/01/22 0023 07/02/22 0121  WBC 16.9* 11.1*  HGB 12.6* 11.8*  HCT 37.5* 34.5*  PLT 268 264   BMET Recent Labs    07/01/22 0023 07/02/22 0121  NA 135 134*  K 4.8 3.9  CL 100 100  CO2 25 26  GLUCOSE 126* 91  BUN 16 20  CREATININE 1.11 0.99  CALCIUM 8.6* 8.6*   PT/INR No results for input(s): "LABPROT", "INR" in the last 72 hours. ABG No results for input(s): "PHART", "HCO3" in the last 72 hours.  Invalid input(s): "PCO2", "PO2"  Studies/Results: No results found.  Anti-infectives: Anti-infectives (From admission, onward)    Start     Dose/Rate Route Frequency Ordered Stop   06/30/22 0800  ceFAZolin (ANCEF) IVPB 2g/100 mL premix  Status:  Discontinued       See Hyperspace for full Linked Orders Report.   2 g 200 mL/hr over 30 Minutes Intravenous To ShortStay Surgical 06/29/22 1000 06/29/22 1436   06/30/22 0800  metroNIDAZOLE (FLAGYL) IVPB 500 mg  Status:  Discontinued       See Hyperspace for full Linked Orders Report.   500 mg 100 mL/hr over 60 Minutes Intravenous To ShortStay Surgical 06/29/22 1000 06/29/22 1436   06/30/22 0600  cefTRIAXone (ROCEPHIN) 2 g in sodium chloride 0.9 % 100 mL IVPB       See Hyperspace for full Linked Orders Report.   2 g 200 mL/hr over 30 Minutes Intravenous On call to O.R. 06/29/22 1436 06/30/22 1000   06/30/22 0600   metroNIDAZOLE (FLAGYL) IVPB 500 mg       See Hyperspace for full Linked Orders Report.   500 mg 100 mL/hr over 60 Minutes Intravenous On call to O.R. 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  - CEA in process - surgical path pending - uro consult for bladder mass - outpatient work up - GI consult for esophageal thickening - no repeat EGD at this time and signed off - I have contacted Dr. Truett Perna for oncology consult - soft diet today - ambulate and IS - PT ordered     FEN: soft, ensure ID: rocephin/flagyl periop VTE: lovenox Foley: voiding Dispo possibly home tomorrow \ Austin [name] 07/02/2022

## 2022-07-02 NOTE — Plan of Care (Signed)

## 2022-07-02 NOTE — Progress Notes (Signed)
PROGRESS NOTE    Austin [name]  ZOX:[id number] DOB: 03-23-1940 DOA: 06/26/2022 PCP: [facility]    Brief Narrative:   Patient is a 82 year old male with past medical history of previous small bowel obstruction, hypertension, COPD, diastolic CHF and CAD who presented to the emergency room with nausea, vomiting, diarrhea and left-sided abdominal pain  for 2 days similar to previous bowel obstruction.  In the ED, patient was slightly hypertensive.  CBC within normal range including lipase.  CT scan of the abdomen and pelvis however showed small bowel obstruction with transition point and diverticulosis.  General surgery was consulted and patient was then admitted to the hospital for further evaluation and treatment.  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  General surgery on board and has advanced to soft diet today.  The operative biopsy shows moderately differentiated mucinous adenocarcinoma.  CEA in process. GI pathogen panel negative.  Leukocytosis trending down.  Will  Surgical pathology pending.  .    Bladder mass.  Seen by urology has seen the patient at this time and recommend outpatient follow-up in the clinic.    COPD: Appears stable.  Has  mild cough.   Diastolic CHF: Considered.  Normal BNP.  No dyspnea  Severe protein calorie malnutrition:  Nutrition Status: Nutrition Problem: Severe Malnutrition Etiology: chronic illness Signs/Symptoms: severe muscle depletion, severe fat depletion, percent weight loss Percent weight loss: 33 % Interventions: Refer to RD note for recommendations Body mass index is 16.61 kg/m.  Reported 60 pound weight loss.  Likely secondary to peritoneal carcinomatosis.  TSH at 1.6.  PSA within normal range.     History of diverticulosis: No evidence of diverticulitis at this time..    DVT prophylaxis: enoxaparin (LOVENOX) injection 40 mg Start: 07/01/22 1000 SCDs Start: 06/26/22 1531   Code Status:     Code Status: Full Code  Disposition:   Likely in 1 to 2 days  Status is: Inpatient  Remains inpatient appropriate because: Bowel obstruction, status post diagnostic laparoscopy with exploratory laparotomy, pending biopsy and oncology evaluation.   Family Communication:  Spoke with the patient's spouse at and sons at bedside on 06/30/2022.   Consultants:  General surgery. GI and  Urology Oncology  Procedures:  status post exploratory laparotomy with right hemicolectomy and peritoneal biopsy on 06/30/2022  Antimicrobials:  None   Subjective: Today, patient was seen and examined at bedside.  Denies any nausea vomiting but had a bowel movement.  Tolerating full liquids.  Complains of mild pain this morning and required pain medication.  Objective: Vitals:   07/01/22 1632 07/01/22 2116 07/02/22 0629 07/02/22 0816  BP: (!) 143/75 (!) 173/76 (!) 178/76 (!) 144/80  Pulse: 73 69 71 80  Resp: 18 16 16 17   Temp: 98 F (36.7 C) 99 F (37.2 C) 98.5 F (36.9 C) 98 F (36.7 C)  TempSrc:  Oral  Oral  SpO2: 96% 94%  93% 96%  Weight:      Height:        Intake/Output Summary (Last 24 hours) at 07/02/2022 0947 Last data filed at 07/02/2022 0820 Gross per 24 hour  Intake 240 ml  Output --   Net 240 ml    Filed Weights   06/29/22 0500 06/30/22 0500 07/01/22 0446  Weight: 52.4 kg 52.7 kg 52.5 kg    Physical Examination: Body mass index is 16.61 kg/m.   General: Alert awake and Communicative, thinly built, not in obvious distress,  HENT:   No scleral pallor or icterus noted. Oral mucosa is moist.  Chest:    Diminished breath sounds bilaterally. No crackles or wheezes.  CVS: S1 &S2 heard. No murmur.  Regular rate and rhythm. Abdomen: Soft, laparotomy scar with dressing, appropriate tenderness noted, Extremities: No cyanosis, clubbing or edema.   Psych: Alert, awake and Communicative, oriented CNS:  No cranial nerve deficits.  Power equal in all extremities.   Skin: Warm and dry.  No rashes noted.  Data Reviewed:   CBC: Recent Labs  Lab 06/26/22 1259 06/27/22 0107 06/29/22 1051 07/01/22 0023 07/02/22 0121  WBC 10.5 11.2* 6.9 16.9* 11.1*  NEUTROABS 8.1*  --   --   --   --   HGB 14.2 12.2* 14.0 12.6* 11.8*  HCT 43.3 35.8* 41.8 37.5* 34.5*  MCV 93.7 94.0 92.5 94.2 91.8  PLT 312 257 304 268 264     Basic Metabolic Panel: Recent Labs  Lab 06/26/22 1259 06/27/22 0107 06/29/22 1051 06/30/22 0743 07/01/22 0023 07/02/22 0121  NA 135 137 136  --  135 134*  K 4.3 3.8 4.0  --  4.8 3.9  CL 96* 100 99  --  100 100  CO2 30 29 26   --  25 26  GLUCOSE 120* 103* 96  --  126* 91  BUN 19 19 13   --  16 20  CREATININE 1.06 0.94 0.92  --  1.11 0.99  CALCIUM 9.3 8.4* 9.0  --  8.6* 8.6*  MG  --   --   --  2.0 1.9 2.1     Liver Function Tests: Recent Labs  Lab 06/26/22 1259 06/27/22 0107 06/29/22 1051 07/01/22 0023  AST 18 15 20 19   ALT 16 15 17 16   ALKPHOS 77 63 78 68  BILITOT 0.6 0.8 0.6 0.7  PROT 6.8 5.1* 6.3* 5.5*  ALBUMIN 4.0 2.9* 3.7 3.0*      Radiology Studies: No results found.    LOS: 5 days    Joycelyn Das, MD [facility] Available via Epic secure chat 7am-7pm After these hours, please refer to coverage provider listed on  [email or web address] 07/02/2022, 9:47 AM

## 2022-07-03 ENCOUNTER — Inpatient Hospital Stay (HOSPITAL_COMMUNITY): Payer: No Typology Code available for payment source

## 2022-07-03 DIAGNOSIS — I5032 Chronic diastolic (congestive) heart failure: Secondary | ICD-10-CM | POA: Diagnosis not present

## 2022-07-03 DIAGNOSIS — J439 Emphysema, unspecified: Secondary | ICD-10-CM | POA: Diagnosis not present

## 2022-07-03 DIAGNOSIS — I2510 Atherosclerotic heart disease of native coronary artery without angina pectoris: Secondary | ICD-10-CM | POA: Diagnosis not present

## 2022-07-03 DIAGNOSIS — K56609 Unspecified intestinal obstruction, unspecified as to partial versus complete obstruction: Secondary | ICD-10-CM | POA: Diagnosis not present

## 2022-07-03 LAB — BASIC METABOLIC PANEL
Anion gap: 11 (ref 5–15)
BUN: 27 mg/dL — ABNORMAL HIGH (ref 8–23)
CO2: 27 mmol/L (ref 22–32)
Calcium: 8.7 mg/dL — ABNORMAL LOW (ref 8.9–10.3)
Chloride: 94 mmol/L — ABNORMAL LOW (ref 98–111)
Creatinine, Ser: 1.22 mg/dL (ref 0.61–1.24)
GFR, Estimated: 60 mL/min — ABNORMAL LOW (ref >60)
Glucose, Bld: 140 mg/dL — ABNORMAL HIGH (ref 70–99)
Potassium: 4.1 mmol/L (ref 3.5–5.1)
Sodium: 132 mmol/L — ABNORMAL LOW (ref 135–145)

## 2022-07-03 LAB — CBC
HCT: 41.3 % (ref 39.0–52.0)
Hemoglobin: 14.1 g/dL (ref 13.0–17.0)
MCH: 31.5 pg (ref 26.0–34.0)
MCHC: 34.1 g/dL (ref 30.0–36.0)
MCV: 92.2 fL (ref 80.0–100.0)
Platelets: 341 10*3/uL (ref 150–400)
RBC: 4.48 MIL/uL (ref 4.22–5.81)
RDW: 13.3 % (ref 11.5–15.5)
WBC: 14.2 10*3/uL — ABNORMAL HIGH (ref 4.0–10.5)
nRBC: 0 % (ref 0.0–0.2)

## 2022-07-03 LAB — CEA: CEA: 5.1 ng/mL — ABNORMAL HIGH (ref 0.0–4.7)

## 2022-07-03 MED ORDER — POLYETHYLENE GLYCOL 3350 17 G PO PACK
17.0000 g | PACK | Freq: Every day | ORAL | Status: DC
  Administered 2022-07-03 – 2022-07-04 (×2): 17 g via ORAL
  Filled 2022-07-03 (×2): qty 1

## 2022-07-03 MED ORDER — DOCUSATE SODIUM 100 MG PO CAPS
100.0000 mg | ORAL_CAPSULE | Freq: Two times a day (BID) | ORAL | Status: DC
  Administered 2022-07-03 – 2022-07-04 (×3): 100 mg via ORAL
  Filled 2022-07-03 (×3): qty 1

## 2022-07-03 MED ORDER — BOOST / RESOURCE BREEZE PO LIQD CUSTOM
1.0000 | Freq: Two times a day (BID) | ORAL | Status: DC
  Administered 2022-07-03: 1 via ORAL

## 2022-07-03 MED ORDER — ACETAMINOPHEN 325 MG PO TABS: 650.0000 mg | ORAL_TABLET | Freq: Four times a day (QID) | ORAL | Status: DC | PRN

## 2022-07-03 NOTE — Discharge Instructions (Signed)
[facility]      [facility] [phone]  OPEN ABDOMINAL SURGERY: POST OP INSTRUCTIONS  Always review your discharge instruction sheet given to you by the facility where your surgery was performed.  IF YOU HAVE DISABILITY OR FAMILY LEAVE FORMS, YOU MUST BRING THEM TO THE OFFICE FOR PROCESSING.  PLEASE DO NOT GIVE THEM TO YOUR DOCTOR.  A prescription for pain medication may be given to you upon discharge.  Take your pain medication as prescribed, if needed.  If narcotic pain medicine is not needed, then you may take acetaminophen (Tylenol) or ibuprofen (Advil) as needed. Take your usually prescribed medications unless otherwise directed. If you need a refill on your pain medication, please contact your pharmacy. They will contact our office to request authorization.  Prescriptions will not be filled after 5pm or on week-ends. You should follow a light diet the first few days after arrival home, such as soup and crackers, pudding, etc.unless your doctor has advised otherwise. A high-fiber, low fat diet can be resumed as tolerated.   Be sure to include lots of fluids daily. Most patients will experience some swelling and bruising on the chest and neck area.  Ice packs will help.  Swelling and bruising can take several days to resolve Most patients will experience some swelling and bruising in the area of the incision. Ice pack will help. Swelling and bruising can take several days to resolve..  It is common to experience some constipation if taking pain medication after surgery.  Increasing fluid intake and taking a stool softener will usually help or prevent this problem from occurring.  A mild laxative (Milk of Magnesia or Miralax) should be taken according to package directions if there are no bowel movements after 48 hours.  You may have steri-strips (small skin tapes) in place directly over the incision.  These strips should be left on the skin for 7-10 days.  If your surgeon used skin  glue on the incision, you may shower in 24 hours.  The glue will flake off over the next 2-3 weeks.  Any sutures or staples will be removed at the office during your follow-up visit. You may find that a light gauze bandage over your incision may keep your staples from being rubbed or pulled. You may shower and replace the bandage daily. ACTIVITIES:  You may resume regular (light) daily activities beginning the next day--such as daily self-care, walking, climbing stairs--gradually increasing activities as tolerated.  You may have sexual intercourse when it is comfortable.  Refrain from any heavy lifting or straining until approved by your doctor. You may drive when you no longer are taking prescription pain medication, you can comfortably wear a seatbelt, and you can safely maneuver your car and apply brakes Return to Work: ___________________________________ You should see your doctor in the office for a follow-up appointment approximately two weeks after your surgery.  Make sure that you call for this appointment within a day or two after you arrive home to insure a convenient appointment time.   WHEN TO CALL YOUR DOCTOR: Fever over 101.0 Inability to urinate Nausea and/or vomiting Extreme swelling or bruising Continued bleeding from incision. Increased pain, redness, or drainage from the incision. Difficulty swallowing or breathing Muscle cramping or spasms. Numbness or tingling in hands or feet or around lips.  The clinic staff is available to answer your questions during regular business hours.  Please don't hesitate to call and ask to speak to one of the nurses if you   have concerns.  For further questions, please visit [email or web address]  

## 2022-07-03 NOTE — Progress Notes (Signed)
PROGRESS NOTE    NITAI CONSER  WUJ:[id number] DOB: [birth year] DOA: 06/26/2022 PCP: [facility]    Brief Narrative:   Patient is a 82 year old male with past medical history of previous small bowel obstruction, hypertension, COPD, diastolic CHF and CAD who presented to the emergency room with nausea, vomiting, diarrhea and left-sided abdominal pain  for 2 days similar to previous bowel obstruction.  In the ED, patient was slightly hypertensive.  CBC within normal range including lipase.  CT scan of the abdomen and pelvis however showed small bowel obstruction with transition point and diverticulosis.  General surgery was consulted and patient was then admitted to the hospital for further evaluation and treatment.  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  General surgery on board diet was advised but the patient continues to have nausea vomiting and poor oral tolerance.  The operative biopsy shows moderately differentiated mucinous adenocarcinoma.  CEA in process. GI pathogen panel negative.  Oncology has been consulted and will follow recommendation..  Bladder mass.  Seen by urology has seen the patient at this time and recommend outpatient  follow-up in the clinic.    COPD: Appears stable.  Has mild cough.   Diastolic CHF: Considered.  Normal BNP.  No dyspnea  Severe protein calorie malnutrition:  Nutrition Status: Nutrition Problem: Severe Malnutrition Etiology: chronic illness Signs/Symptoms: severe muscle depletion, severe fat depletion, percent weight loss Percent weight loss: 33 % Interventions: Refer to RD note for recommendations Body mass index is 15.34 kg/m.  Reported 60 pound weight loss.  Likely secondary to peritoneal carcinomatosis.  TSH at 1.6.  PSA within normal range.     History of diverticulosis: No evidence of diverticulitis at this time..    DVT prophylaxis: enoxaparin (LOVENOX) injection 40 mg Start: 07/01/22 1000 SCDs Start: 06/26/22 1531   Code Status:     Code Status: Full Code  Disposition:   Likely in 1 to 2 days, pending clinical improvement  Status is: Inpatient  Remains inpatient appropriate because: Bowel obstruction, status post diagnostic laparoscopy with exploratory laparotomy, oncology evaluation.  Pending clinical improvement.   Family Communication:  Spoke with the patient's spouse on 07/03/2022.   Consultants:  General surgery. GI and  Urology Oncology  Procedures:  Status post exploratory laparotomy with right hemicolectomy and peritoneal biopsy on 06/30/2022  Antimicrobials:  None  Subjective:  Today, patient was seen and examined at bedside.  Complains of nausea and had vomiting yesterday could not tolerate oral diet.  Denies any pain at this time.  Patient's family at bedside.    Objective: Vitals:   07/02/22 1631 07/02/22 2116 07/03/22 0533 07/03/22 0846  BP: (!) 173/89 (!) 152/79 (!) 147/94 139/88  Pulse: 95 92 88 88  Resp: 16 17 17 17   Temp: 98.9 F (37.2 C) 97.9 F (36.6 C) 98.4 F (36.9 C) 98.2 F (36.8 C)  TempSrc: Oral Oral Oral Oral  SpO2: 97% 96% 96% 94%  Weight:   48.5 kg   Height:       No intake or output data in the 24 hours ending  07/03/22 1110  Filed Weights   06/30/22 0500 07/01/22 0446 07/03/22 0533  Weight: 52.7 kg 52.5 kg 48.5 kg    Physical Examination: Body mass index is 15.34 kg/m.   General: Alert awake and Communicative, thinly built, not in obvious distress.   \HENT:   No scleral pallor or icterus noted. Oral mucosa is moist.  Chest:    Diminished breath sounds bilaterally. No crackles or wheezes.  CVS: S1 &S2 heard. No murmur.  Regular rate and rhythm. Abdomen: Soft, laparotomy scar with dressing, appropriate tenderness noted. Extremities: No cyanosis, clubbing or edema.   Psych: Alert, awake and Communicative, oriented CNS:  No cranial nerve deficits.  Power equal in all extremities.   Skin: Warm and dry.  No rashes noted.  Data Reviewed:   CBC: Recent Labs  Lab 06/26/22 1259 06/27/22 0107 06/29/22 1051 07/01/22 0023 07/02/22 0121 07/03/22 0045  WBC 10.5 11.2* 6.9 16.9* 11.1* 14.2*  NEUTROABS 8.1*  --   --   --   --   --   HGB 14.2 12.2* 14.0 12.6* 11.8* 14.1  HCT 43.3 35.8* 41.8 37.5* 34.5* 41.3  MCV 93.7 94.0 92.5 94.2 91.8 92.2  PLT 312 257 304 268 264 341     Basic Metabolic Panel: Recent Labs  Lab 06/27/22 0107 06/29/22 1051 06/30/22 0743 07/01/22 0023 07/02/22 0121 07/03/22 0045  NA 137 136  --  135 134* 132*  K 3.8 4.0  --  4.8 3.9 4.1  CL 100 99  --  100 100 94*  CO2 29 26  --  25 26 27   GLUCOSE 103* 96  --  126* 91 140*  BUN 19 13  --  16 20 27*  CREATININE 0.94 0.92  --  1.11 0.99 1.22  CALCIUM 8.4* 9.0  --  8.6* 8.6* 8.7*  MG  --   --  2.0 1.9 2.1  --      Liver Function Tests: Recent Labs  Lab 06/26/22 1259 06/27/22 0107 06/29/22 1051 07/01/22 0023  AST 18 15 20 19   ALT 16 15 17 16   ALKPHOS 77 63 78 68  BILITOT 0.6 0.8 0.6 0.7  PROT 6.8 5.1* 6.3* 5.5*  ALBUMIN 4.0 2.9* 3.7 3.0*      Radiology Studies: DG Abd Portable 1V  Result Date: 07/03/2022 CLINICAL DATA:  Nausea and vomiting EXAM: PORTABLE ABDOMEN - 1 VIEW COMPARISON:  X-ray  06/27/2022 and CT angiogram FINDINGS: Midline skin staples identified along the lower abdomen and pelvis. Gas is seen along several loops of small and large bowel. Loops are nondilated but there is diffuse bowel gas. There is air in the rectum. There is some residual contrast in the colon with what appear to be diverticula. There does appear to be some free air but the patient is postop. Please correlate with clinical history. Degenerative changes. IMPRESSION: Postop changes identified with midline skin staples as well as some free air. Please correlate with surgical history. Diffuse bowel gas identified and small and large bowel. A few loops are distended but no clear dilatation or obstruction at this time. Air in the rectum. Some residual contrast in the colon with what appear to be diverticula. Electronically Signed   By: Karen Kays M.D.   On: 07/03/2022 10:36      LOS: 6 days  Joycelyn Das, MD [facility] Available via Epic secure chat 7am-7pm After these hours, please refer to coverage provider listed on [email or web address] 07/03/2022, 11:10 AM

## 2022-07-03 NOTE — Plan of Care (Signed)
Pt had complaint of nausea at shift change. Refused zofran at hs for nausea. This am pt denied nausea or vomiting. Stating he is feeling much better. Dsg clean dry and intact. Vitals stable.  Problem: Education: Goal: Knowledge of General Education information will improve Description: Including pain rating scale, medication(s)/side effects and non-pharmacologic comfort measures Outcome: Progressing   Problem: Health Behavior/Discharge Planning: Goal: Ability to manage health-related needs will improve Outcome: Progressing   Problem: Clinical Measurements: Goal: Ability to maintain clinical measurements within normal limits will improve Outcome: Progressing Goal: Will remain free from infection Outcome: Progressing Goal: Diagnostic test results will improve Outcome: Progressing Goal: Respiratory complications will improve Outcome: Progressing Goal: Cardiovascular complication will be avoided Outcome: Progressing   Problem: Activity: Goal: Risk for activity intolerance will decrease Outcome: Progressing   Problem: Nutrition: Goal: Adequate nutrition will be maintained Outcome: Progressing   Problem: Coping: Goal: Level of anxiety will decrease Outcome: Progressing   Problem: Elimination: Goal: Will not experience complications related to bowel motility Outcome: Progressing Goal: Will not experience complications related to urinary retention Outcome: Progressing   Problem: Pain Managment: Goal: General experience of comfort will improve Outcome: Progressing   Problem: Safety: Goal: Ability to remain free from injury will improve Outcome: Progressing   Problem: Skin Integrity: Goal: Risk for impaired skin integrity will decrease Outcome: Progressing

## 2022-07-03 NOTE — Progress Notes (Signed)
Progress Note  3 Days Post-Op  Subjective: Pt reports increased nausea and vomiting overnight. No flatus this AM. Denies abdominal pain or bloating however. Walking some in room but not in hall. Wife and daughter at bedside.   Objective: Vital signs in last 24 hours: Temp:  [97.9 F (36.6 C)-98.9 F (37.2 C)] 98.2 F (36.8 C) (05/20 0846) Pulse Rate:  [88-95] 88 (05/20 0846) Resp:  [16-17] 17 (05/20 0846) BP: (139-173)/(79-94) 139/88 (05/20 0846) SpO2:  [94 %-97 %] 94 % (05/20 0846) Weight:  [48.5 kg] 48.5 kg (05/20 0533) Last BM Date : 06/30/22  Intake/Output from previous day: 05/19 0701 - 05/20 0700 In: 240 [P.O.:240] Out: -  Intake/Output this shift: No intake/output data recorded.  PE: General: pleasant, WD, thin male who is laying in bed in NAD Heart: regular, rate, and rhythm.   Lungs: CTAB, no wheezes, rhonchi, or rales noted.  Respiratory effort nonlabored Abd: soft, NT, ND, BS hypoactive, honeycomb dressing present with dried blood inferiorly  Psych: A&Ox3 with an appropriate affect.    Lab Results:  Recent Labs    07/02/22 0121 07/03/22 0045  WBC 11.1* 14.2*  HGB 11.8* 14.1  HCT 34.5* 41.3  PLT 264 341   BMET Recent Labs    07/02/22 0121 07/03/22 0045  NA 134* 132*  K 3.9 4.1  CL 100 94*  CO2 26 27  GLUCOSE 91 140*  BUN 20 27*  CREATININE 0.99 1.22  CALCIUM 8.6* 8.7*   PT/INR No results for input(s): "LABPROT", "INR" in the last 72 hours. CMP     Component Value Date/Time   NA 132 (L) 07/03/2022 0045   K 4.1 07/03/2022 0045   CL 94 (L) 07/03/2022 0045   CO2 27 07/03/2022 0045   GLUCOSE 140 (H) 07/03/2022 0045   BUN 27 (H) 07/03/2022 0045   CREATININE 1.22 07/03/2022 0045   CALCIUM 8.7 (L) 07/03/2022 0045   PROT 5.5 (L) 07/01/2022 0023   ALBUMIN 3.0 (L) 07/01/2022 0023   AST 19 07/01/2022 0023   ALT 16 07/01/2022 0023   ALKPHOS 68 07/01/2022 0023   BILITOT 0.7 07/01/2022 0023   GFRNONAA 60 (L) 07/03/2022 0045   GFRAA   11/06/2008 1030    >60        The eGFR has been calculated using the MDRD equation. This calculation has not been validated in all clinical situations. eGFR's persistently <60 mL/min signify possible Chronic Kidney Disease.   Lipase     Component Value Date/Time   LIPASE 32 06/26/2022 1259       Studies/Results: No results found.  Anti-infectives: Anti-infectives (From admission, onward)    Start     Dose/Rate Route Frequency Ordered Stop   06/30/22 0800  ceFAZolin (ANCEF) IVPB 2g/100 mL premix  Status:  Discontinued       See Hyperspace for full Linked Orders Report.   2 g 200 mL/hr over 30 Minutes Intravenous To ShortStay Surgical 06/29/22 1000 06/29/22 1436   06/30/22 0800  metroNIDAZOLE (FLAGYL) IVPB 500 mg  Status:  Discontinued       See Hyperspace for full Linked Orders Report.   500 mg 100 mL/hr over 60 Minutes Intravenous To ShortStay Surgical 06/29/22 1000 06/29/22 1436   06/30/22 0600  cefTRIAXone (ROCEPHIN) 2 g in sodium chloride 0.9 % 100 mL IVPB       See Hyperspace for full Linked Orders Report.   2 g 200 mL/hr over 30 Minutes Intravenous On call to O.R. 06/29/22 1436  06/30/22 1000   06/30/22 0600  metroNIDAZOLE (FLAGYL) IVPB 500 mg       See Hyperspace for full Linked Orders Report.   500 mg 100 mL/hr over 60 Minutes Intravenous On call to O.R. 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  - CEA mildly elevated at 5.1 - surgical path pending - uro consult for bladder mass - outpatient work up - GI consult for esophageal thickening - no repeat EGD at this time and signed off - Dr. Truett Perna saw this AM for oncology consult - increased nausea and vomiting and less bowel function - NPO and check  KUB - ambulate and IS - PT ordered   FEN: NPO ID: [email or web address]/flagyl periop VTE: lovenox Foley: voiding   LOS: 6 days     Juliet Rude, [facility] 07/03/2022, 9:36 AM Please see Amion for pager number during day hours 7:00am-4:30pm

## 2022-07-03 NOTE — Consult Note (Signed)
New Hematology/Oncology Consult   Requesting VW:[id number] Pokhrel      Reason for Consult: Small bowel carcinoma  HPI: Austin [name] reports developing intermittent nausea and vomiting beginning in the summer 2023.  He was admitted on 11/05/2021 small bowel obstruction.  An upper endoscopy and push enteroscopy were unremarkable.  CT abdomen/pelvis 11/05/2021 revealed a small bowel obstruction with a transition in the right mid abdomen.  A CT enteroscopy 11/07/2021 revealed a decrease in the small bowel dilation.  Asymmetric low-attenuation was noted at the base of the cecum and lateral wall of the terminal ileum.  He was discharged home 11/07/2021.  He was readmitted 11/16/2021 with "diverticulitis ".  He reports persistence of intermittent nausea and vomiting over the past several months.  There is associated weight loss.  He was readmitted on 06/26/2022.  A CT abdomen/pelvis revealed a small bowel obstruction with a transition in the lower abdomen/pelvis.  New nodular densities were noted in the right lower lobe. CT angiogram abdomen/pelvis 06/27/2022 revealed masslike thickening of the terminal ileum with progressive peritoneal soft tissue thickening at the right paracolic gutter suspicious for carcinomatosis.  Increase in size of a mass within the posterior lateral left bladder.   He was taken to the operating room by Dr. Corliss Skains on 06/30/2022.  Widespread peritoneal implants were noted.  A mass was noted at the terminal ileum.  Frozen section of peritoneal implants returned as moderately differentiated mucinous adenocarcinoma.  A right colectomy was performed.  Austin [name] reports feeling well at present.  He is having small bowel movements.  He is tolerating liquids. No abdominal pain.  He reports feeling well other than the intermittent nausea and vomiting since last summer.    Past Medical History:  Diagnosis Date   CAD (coronary artery disease)    COPD (chronic obstructive pulmonary disease) (HCC)     Dyspnea on exertion    GERD (gastroesophageal reflux disease)    Hyperlipidemia    MI (myocardial infarction) (HCC)    Rectal bleeding    SBO (small bowel obstruction) (HCC)    Sept '23   Tobacco abuse    Unspecified essential hypertension     .  Basal cell carcinoma of the left side of the nose-2023   Past Surgical History:  Procedure Laterality Date   CORONARY ANGIOPLASTY WITH STENT PLACEMENT     Drug eluting stent placed to the first obtuse marginal with a 2.58 mm cypher drug eluting stent. Circumflex was treated with 3.0 x 18 mm TAXUS drug-eluting stent on 07/17/03   ENTEROSCOPY N/A 11/07/2021   Procedure: ENTEROSCOPY;  Surgeon: Charlott Rakes, MD;  Location: [facility] ENDOSCOPY;  Service: Gastroenterology;  Laterality: N/A;   EYE SURGERY  1971   bilateral   LAPAROSCOPY N/A 06/30/2022   Procedure: LAPAROSCOPY DIAGNOSTIC;  Surgeon: Manus Rudd, MD;  Location: [facility] OR;  Service: General;  Laterality: N/A;   LAPAROTOMY N/A 06/30/2022   Procedure: EXPLORATORY LAPAROTOMY, RIGHT HEMICOLECTOMY, PERITONEAL BIOPSY;  Surgeon: Manus Rudd, MD;  Location: [facility] OR;  Service: General;  Laterality: N/A;   PTCA     And drug-eluting stent to the RCA   RECTAL SURGERY  2008   [name] did this surgery  :   Current Facility-Administered Medications:    enoxaparin (LOVENOX) injection 40 mg, 40 mg, Subcutaneous, Q24H, Maczis, Elmer Sow, PA-C, 40 mg at 07/02/22 1051   feeding supplement (ENSURE ENLIVE / ENSURE PLUS) liquid 237 mL, 237 mL, Oral, BID BM, Eric Form, PA-C, 237 mL at 07/02/22 1051  lidocaine (LIDODERM) 5 % 1 patch, 1 patch, Transdermal, Q24H, Simaan, Elizabeth S, PA-C, 1 patch at 06/30/22 1409   ondansetron (ZOFRAN) injection 4 mg, 4 mg, Intravenous, Q6H PRN, Pokhrel, Laxman, MD, 4 mg at 07/02/22 1341   sodium chloride flush (NS) 0.9 % injection 3 mL, 3 mL, Intravenous, Q12H, Simaan, Elizabeth S, PA-C, 3 mL at 07/02/22 1051   traMADol (ULTRAM) tablet 50 mg, 50 mg, Oral, Q6H PRN,  Simaan, Elizabeth S, PA-C, 50 mg at 06/30/22 1404:   enoxaparin (LOVENOX) injection  40 mg Subcutaneous Q24H   feeding supplement  237 mL Oral BID BM   lidocaine  1 patch Transdermal Q24H   sodium chloride flush  3 mL Intravenous Q12H  :  No Known Allergies:  FH: His father died of COPD.  His mother died of a gynecologic malignancy  SOCIAL HISTORY: He lives with his wife in [place].  He is retired [employer].  He quit smoking cigarettes in 2006.  Rare alcohol use.  No transfusion history.  No risk factor for hepatitis.  He served in the [employer] in [place]  Review of Systems:  Positives include: Intermittent nausea and vomiting since October 2023, weight loss  A complete ROS was otherwise negative.   Physical Exam:  Blood pressure (!) 147/94, pulse 88, temperature 98.4 F (36.9 C), temperature source Oral, resp. rate 17, height 5\' 10"  (1.778 m), weight 106 lb 14.8 oz (48.5 kg), SpO2 96 %.  HEENT: Edentulous Lungs: Distant breath sounds, no respiratory distress Cardiac: Distant heart sounds, regular rhythm Abdomen: Midline incision with staples in place.  Soft, no mass, no apparent ascites  Vascular: No leg edema Lymph nodes: No cervical, supraclavicular, axillary, or inguinal nodes Neurologic: Alert and oriented, the motor exam appears intact in the upper and lower extremities bilaterally Skin: No rash Musculoskeletal: No spine tenderness  LABS:   Recent Labs    07/02/22 0121 07/03/22 0045  WBC 11.1* 14.2*  HGB 11.8* 14.1  HCT 34.5* 41.3  PLT 264 341     Recent Labs    07/02/22 0121 07/03/22 0045  NA 134* 132*  K 3.9 4.1  CL 100 94*  CO2 26 27  GLUCOSE 91 140*  BUN 20 27*  CREATININE 0.99 1.22  CALCIUM 8.6* 8.7*      RADIOLOGY:  CT Angio Abd/Pel w/ and/or w/o  Result Date: 06/28/2022 CLINICAL DATA:  Unintentional 60 pound weight loss over 6 months Abdominal pain Vomiting Diarrhea. EXAM: CTA ABDOMEN AND PELVIS WITHOUT AND WITH CONTRAST  TECHNIQUE: Multidetector CT imaging of the abdomen and pelvis was performed using the standard protocol during bolus administration of intravenous contrast. Multiplanar reconstructed images and MIPs were obtained and reviewed to evaluate the vascular anatomy. RADIATION DOSE REDUCTION: This exam was performed according to the departmental dose-optimization program which includes automated exposure control, adjustment of the mA and/or kV according to patient size and/or use of iterative reconstruction technique. CONTRAST:  OMNIPAQUE IOHEXOL 350 MG/ML SOLN COMPARISON:  06/26/2022 11/16/2021 FINDINGS: VASCULAR Aorta: Calcified atheromatous plaque seen throughout the abdominal aorta without aneurysm or flow-limiting stenosis. Thin linear web-like structure seen across the lumen of the aorta on images 37 and 46 of series 5 are favored to be remodeled plaque rather than nonflow-limiting dissections. Celiac: Mild narrowing of the origin due to calcified plaque. No significant narrowing of splenic, left gastric, or common hepatic arteries. SMA: No significant narrowing of the superior mesenteric artery. Renals: Co dominant main right renal arteries are patent without significant stenosis.  Moderate to severe stenosis at the origin of the left main renal artery, best seen on image 70 of series 8. Accessory left renal artery originates approximately 2 cm below the main left renal artery and is mildly narrowed at the origin. IMA: Mild narrowing at the origin of the inferior mesenteric artery. 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 Severe stenosis of the origin of the right internal iliac artery which is otherwise patent. Right external iliac artery is diffusely calcified but patent without significant stenosis. Moderate focal stenosis of the proximal left common femoral artery predominately due to noncalcified plaque. Near  complete occlusion at the origin of the internal iliac artery which is otherwise patent. Mild diffuse narrowing of the left external iliac artery due to calcified plaque. Proximal Outflow: No significant stenosis of the visualized outflow arteries. Veins: Hepatic, portal, superior mesenteric, and splenic veins are patent without significant stenosis. Reflux of contrast in the hepatic veins on arterial phase images suggestive of elevated right heart pressures. Review of the MIP images confirms the above findings. NON-VASCULAR Lower chest: Moderate emphysematous changes the lung bases. Hepatobiliary: 9 mm simple cyst in the right liver lobe does not require dedicated imaging follow-up. 7 mm left hepatic lobe lesion (image 15, series 7) demonstrates peripheral puddling on prior CT from 11/16/2021 and most likely represents a small hemangioma. No suspicious liver lesions. No bile duct dilatation. Gallbladder is unremarkable. Pancreas: Unremarkable. No pancreatic ductal dilatation or surrounding inflammatory changes. Spleen: Normal in size without focal abnormality. Adrenals/Urinary Tract: Adrenal glands are unremarkable. 2.5 cm simple right renal cyst does not require dedicated imaging follow-up. Subcentimeter left renal hypodensities are too small to fully characterize, however also most likely represent simple cysts and do not require dedicated imaging follow-up. Kidneys and ureters otherwise unremarkable. There has been progressive increase in size of mass within the posterolateral bladder which now measures 3.4 x 1.5 cm compared to 1.8 x 1.1 cm on prior study from 11/16/2021. This is suspicious for primary bladder neoplasm. Stomach/Bowel: There is thickening of the wall of the distal esophagus which may be due to esophagitis, however underlying mass is not excluded. The degree of thickening has increased since 11/16/2021. 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 Interval resolution of previously seen small bowel  dilation consistent with resolved obstruction. There is circumferential masslike thickening of the wall of the terminal ileum, best seen on images 67-86 of series 14. Sigmoid diverticulosis without evidence of acute diverticulitis. Lymphatic: No enlarged abdominal or pelvic lymph nodes. Reproductive: Unchanged moderate prostatomegaly. Other: No abdominal wall hernia or abnormality. No abdominopelvic ascites. There has been interval increase of peritoneal soft tissue thickening along the right paracolic gutter, best seen on image 34 of series 7. This is suspicious for peritoneal carcinomatosis. Musculoskeletal: No acute or significant osseous findings. IMPRESSION: 1. Circumferential masslike thickening of the wall of the terminal ileum is suspicious for primary neoplasm. Progressive peritoneal soft tissue thickening along the right paracolic gutter suspicious for carcinomatosis. 2. Interval increase in size of mass within the posterolateral left bladder, now measuring 3.4 x 1.5 cm compared to 1.8 x 1.1 cm on prior study from 11/16/2021. This is suspicious for primary bladder neoplasm. 3. Interval resolution of previously seen small bowel dilation consistent with resolved obstruction. 4. Distal esophageal wall thickening has increased since 11/16/2021. This may be due to esophagitis, however underlying mass is not excluded. Endoscopy should be considered. 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 5. 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 6. 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 7. Interval increase in size of mass within the posterolateral bladder. This is suspicious for primary bladder neoplasm. 8. Sigmoid diverticulosis without evidence of acute diverticulitis. Electronically Signed   By: Acquanetta Belling M.D.   On: 06/28/2022 14:22   DG Abd Portable 1V-Small Bowel Obstruction Protocol-initial, 8 hr delay  Result  Date: 06/27/2022 CLINICAL DATA:  82 year old male with evidence of small bowel obstruction on CT Abdomen and Pelvis yesterday. Refused NG tube. EXAM: PORTABLE ABDOMEN - 1 VIEW COMPARISON:  CT Abdomen and Pelvis yesterday. FINDINGS: Portable AP supine view at 0619 hours. Oral contrast is now present in small and large bowel loops, including throughout the large bowel to the level of the rectum. Some flocculated contrast in left abdominal small bowel, but no evidence of dilated small bowel loops now. Lung bases appear stable, negative. Stable visualized osseous structures. IMPRESSION: Oral contrast has been administered and the bowel-gas pattern has essentially normalized since the CT Abdomen and Pelvis yesterday. Enteric contrast present to the rectum. Electronically Signed   By: Odessa Fleming M.D.   On: 06/27/2022 07:07   CT ABDOMEN PELVIS WO CONTRAST  Result Date: 06/26/2022 CLINICAL DATA:  Left lower quadrant abdominal pain EXAM: CT ABDOMEN AND PELVIS WITHOUT CONTRAST TECHNIQUE: Multidetector CT imaging of the abdomen and pelvis was performed following the standard protocol without IV contrast. RADIATION DOSE REDUCTION: This exam was performed according to the departmental dose-optimization program which includes automated exposure control, adjustment of the mA and/or kV according to patient size and/or use of iterative reconstruction technique. COMPARISON:  11/16/2021 FINDINGS: Lower chest: Advanced changes of emphysema. No pleural fluid or airspace disease. Several irregular nodular densities are identified within the right lower lobe. The largest measures 0.8 cm in appears new from previous exam, image 13/5. Hepatobiliary: Posterior right lobe of liver cyst appears unchanged measuring 9 mm. No suspicious liver abnormality. Gallbladder appears within normal limits. No bile duct dilatation. Pancreas: Unremarkable. No pancreatic ductal dilatation or surrounding inflammatory changes. Spleen: Normal in size without  focal abnormality. Adrenals/Urinary Tract: Normal adrenal glands. No nephrolithiasis or hydronephrosis. Simple right kidney cyst is unchanged measuring 1.9 cm, image 20/2. No follow-up imaging recommended. The urinary bladder contains multiple tiny layering hyperdensities within the dependent portion of the bladder which appear increased from the previous exam and may represent debris and/or stones. Stomach/Bowel: Stomach appears normal. There are several loops of abnormal dilated small bowel within the central abdomen with air-fluid levels. These measure up to 4.7 cm. Transition to decreased caliber small bowel noted within the lower abdomen and pelvis, image 53/3. The distal small bowel loops are decreased in caliber. No pathologic dilatation of the colon. Extensive sigmoid diverticulosis without signs of acute diverticulitis. Vascular/Lymphatic: Aortic atherosclerosis. No aneurysm. No abdominopelvic adenopathy identified. Reproductive: Spondylosis identified within the lumbar spine. Other: The S no free fluid or fluid collections. No signs of pneumoperitoneum. Musculoskeletal: No acute or significant osseous findings. IMPRESSION: 1. Examination is positive for small bowel obstruction with transition to decreased caliber small bowel within the lower abdomen/ pelvis. 2. Sigmoid diverticulosis without signs of acute diverticulitis. 3. Multiple tiny layering hyperdensities within the dependent portion of the bladder may represent debris and/or stones. These appear increased when compared with the previous exam. 4. Several irregular nodular densities are identified within the right lower lobe. The largest measures 0.8 cm  in appears new from previous exam. Per Fleischner Society Guidelines, recommend a non-contrast Chest CT at 3-6 months, then another non-contrast Chest CT at 18-24 months. 5.  Aortic Atherosclerosis (ICD10-I70.0). Electronically Signed   By: Signa Kell M.D.   On: 06/26/2022 13:26   Assessment and  Plan:   Small bowel carcinoma Admission 06/26/2022 with a small bowel obstruction CT abdomen/pelvis 06/26/2022-small bowel obstruction with transition in the lower abdomen/pelvis, irregular nodular densities in the right lower lobe CT angiogram abdomen/pelvis 06/27/2022-circumferential masslike thickening at the terminal ileum, progressive peritoneal soft tissue at the right paracolic gutter, increase in a posterior lateral left bladder mass, resolution of small bowel dilation, increased distal esophageal wall thickening compared to October 2023  2.  COPD 3.  CAD 4.  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  Enlarging bladder mass on CT 06/27/2022   Austin [name] was admitted 06/26/2022 with a small bowel obstruction.  He has had intermittent struct of for many months.  The clinical presentation, x-rays, and operative findings consistent with a diagnosis of metastatic small bowel carcinoma.  The final pathology report is pending.  I discussed the apparent diagnosis with Mr. Balius.  No therapy will be curative.  We will arrange for outpatient follow-up with the Cancer center to discuss systemic treatment options.  Treatment may include chemotherapy, immunotherapy, and targeted agents.  I reviewed CT images.  I discussed the case with his ex daughter-in-law, Babette Relic, by telephone.  Recommendations: Continue postoperative care per the surgical service Follow-up the final pathology from the right colectomy and submit tumor for molecular testing Outpatient follow-up to be scheduled at the Cancer center in approximately 2 weeks   Thornton Papas, MD 07/03/2022, 7:06 AM

## 2022-07-03 NOTE — Progress Notes (Addendum)
Nutrition Follow-up  DOCUMENTATION CODES:   Severe malnutrition in context of chronic illness, Underweight  INTERVENTION:  - Recommend TPN support as soon as possible.   - Continue Ensure Enlive po BID, each supplement provides 350 kcal and 20 grams of protein.  - Add Boost Breeze po BID, each supplement provides 250 kcal and 9 grams of protein  - Add Icecream TID.   NUTRITION DIAGNOSIS:   Severe Malnutrition related to chronic illness as evidenced by severe muscle depletion, severe fat depletion, percent weight loss. - ongoing.   GOAL:   Patient will meet greater than or equal to 90% of their needs - Not met, ongoing.   MONITOR:   PO intake, Supplement acceptance, Diet advancement, I & O's  REASON FOR ASSESSMENT:   Consult Assessment of nutrition requirement/status  ASSESSMENT:   82 y.o. male presented to the ED abdominal pain, nausea, vomiting, and diarrhea. PMH includes GERD, HTN, COPD, CHF, CAD, and HLD. Pt admitted with SBO.  Meds reviewed: colace, miralax. Labs reviewed: Na low, BUN elevated.   The pt has not been eating well since admission. Per last RD's note, the pt has has had poor PO intakes for the past 7 months. Pt reports that the smell of the food makes him nauseous. The pt has only had a few bites of chicken noodle soup today. The pt is not meeting his needs. Pt is severely malnourished and needs nutrition support as soon as possible. Pt is full code. Pt states that he likes ice cream, RD will add it to each tray. RD will also add Boost Breeze to trays.   RD discussed need for TPN with MD, no plans to start TPN.   Diet Order:   Diet Order             Diet full liquid Room service appropriate? Yes; Fluid consistency: Thin  Diet effective now                   EDUCATION NEEDS:   Education needs have been addressed  Skin:  Skin Assessment: Reviewed RN Assessment  Last BM:  5/17 - type 6  Height:   Ht Readings from Last 1 Encounters:   06/26/22 5\' 10"  (1.778 m)    Weight:   Wt Readings from Last 1 Encounters:  07/03/22 48.5 kg    Ideal Body Weight:  75.5 kg  BMI:  Body mass index is 15.34 kg/m.  Estimated Nutritional Needs:   Kcal:  1700-1900  Protein:  85-105 grams  Fluid:  >/= 1.7 L  Bethann Humble, RD, LDN, CNSC.

## 2022-07-04 DIAGNOSIS — C180 Malignant neoplasm of cecum: Secondary | ICD-10-CM | POA: Diagnosis not present

## 2022-07-04 DIAGNOSIS — I2510 Atherosclerotic heart disease of native coronary artery without angina pectoris: Secondary | ICD-10-CM | POA: Diagnosis not present

## 2022-07-04 DIAGNOSIS — K56609 Unspecified intestinal obstruction, unspecified as to partial versus complete obstruction: Secondary | ICD-10-CM | POA: Diagnosis not present

## 2022-07-04 DIAGNOSIS — I5032 Chronic diastolic (congestive) heart failure: Secondary | ICD-10-CM | POA: Diagnosis not present

## 2022-07-04 DIAGNOSIS — C786 Secondary malignant neoplasm of retroperitoneum and peritoneum: Secondary | ICD-10-CM

## 2022-07-04 MED ORDER — DOCUSATE SODIUM 100 MG PO CAPS: 100.0000 mg | ORAL_CAPSULE | Freq: Two times a day (BID) | ORAL | 0 refills | Status: DC

## 2022-07-04 MED ORDER — ENSURE ENLIVE PO LIQD: 237.0000 mL | Freq: Two times a day (BID) | ORAL | 0 refills | Status: DC

## 2022-07-04 MED ORDER — TRAMADOL HCL 50 MG PO TABS: 50.0000 mg | ORAL_TABLET | Freq: Four times a day (QID) | ORAL | 0 refills | Status: DC | PRN

## 2022-07-04 MED ORDER — POLYETHYLENE GLYCOL 3350 17 G PO PACK: 17.0000 g | PACK | Freq: Every day | ORAL | 0 refills | Status: DC | PRN

## 2022-07-04 MED ORDER — ENSURE ENLIVE PO LIQD: 237.0000 mL | Freq: Two times a day (BID) | ORAL | 0 refills | Status: AC

## 2022-07-04 NOTE — Progress Notes (Signed)
Patient is being discharged home. Discharge instructions reviewed with patient including new medications and post op instructions/appointments. Patient verbalized a full understanding of instructions. Pt wife and daughter are his ride home.

## 2022-07-04 NOTE — Discharge Summary (Signed)
Physician Discharge Summary  Austin [name] ZOX:[id number] DOB: 1940/06/18 DOA: 06/26/2022  PCP: [facility]  Admit date: 06/26/2022 Discharge date: 07/04/2022  Admitted From: Home  Discharge disposition: Home  Recommendations for Outpatient Follow-Up:   Follow up with your primary care provider in one week.  Check CBC, BMP, magnesium in the next visit Follow-up with general surgery on 07/14/2022 for staple removal and further management plan. Patient to follow-up with Dr Myrle Sheng in 2 weeks to decide on further treatment plan.  Discharge Diagnosis:   Principal Problem:   SBO (small bowel obstruction) (HCC) Active Problems:   HLD (hyperlipidemia)   Essential hypertension   CAD (coronary artery disease)   COPD (chronic obstructive pulmonary disease) (HCC)   GERD   Chronic diastolic CHF (congestive heart failure) (HCC)   Weight loss, abnormal   Protein-calorie malnutrition, severe   Peritoneal carcinomatosis (HCC)   Cecal cancer (HCC)   Discharge Condition: Improved.  Diet recommendation:  Regular.  Wound care: None.  Code status: Full.   History of Present Illness:   Patient is a 82 year old male with past medical history of previous small bowel obstruction, hypertension, COPD, diastolic CHF and CAD who presented to the emergency room with nausea, vomiting, diarrhea and left-sided abdominal pain for 2 days similar to previous bowel obstruction. In the ED, CBC within normal range including lipase. CT scan of the abdomen and pelvis however showed small bowel obstruction with transition point and diverticulosis. General surgery was consulted and patient was then admitted to the hospital for further evaluation and treatment.   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  Patient then underwent laparotomy with right hemicolectomy and peritoneal biopsy.    The operative biopsy shows moderately differentiated mucinous adenocarcinoma.  Surgical biopsy pending.  CEA elevated at 5.1.. GI pathogen panel negative.  Oncology procedure was consulted and plan is follow-up in 2 weeks as outpatient.   Bladder mass.  Seen by urology and recommend outpatient follow-up in the clinic.     COPD: Appears stable.  Has mild cough.   Diastolic CHF: Considered.  Normal BNP.  No dyspnea   Severe protein calorie malnutrition:  Nutrition Status: Nutrition Problem: Severe Malnutrition Etiology: chronic illness Signs/Symptoms: severe muscle depletion, severe fat depletion, percent weight loss Percent weight loss: 33 % Interventions: On dietary supplements  Body mass index is 15.34 kg/m.  Reported 60 pound weight loss.  Likely secondary to peritoneal carcinomatosis.  TSH at 1.6.  PSA within normal range.  On nutritional supplements.  Will continue on discharge.   History of diverticulosis: No evidence of diverticulitis at this time..   Disposition.  At this time, patient is stable for disposition home with outpatient PCP, general surgery, oncology and urology follow-up.  Spoke with the patient's wife prior to disposition.  Medical Consultants:   General surgery. GI  and  Urology Oncology  Procedures:    Status post exploratory laparotomy with right hemicolectomy and peritoneal biopsy on 06/30/2022 Subjective:   Today, patient was seen and examined at bedside.  Was able to tolerate oral food.  Seen by general surgery.  No nausea vomiting or abdominal pain.  Has had a bowel movement this morning.  Discharge Exam:   Vitals:   07/04/22 0410 07/04/22 0829  BP: 135/75 132/70  Pulse: 83 78  Resp: 15 16  Temp: 98.2 F (36.8 C) 97.8 F (36.6 C)  SpO2: 100% 94%   Vitals:   07/03/22 1534 07/03/22 2007 07/04/22 0410 07/04/22 0829  BP: (!) 147/82 (!) 151/84 135/75 132/70  Pulse: 85 84 83 78  Resp: 16 16 15 16   Temp: 98.1 F (36.7 C) 98.2 F (36.8 C) 98.2 F (36.8 C) 97.8 F (36.6 C)  TempSrc: Oral Oral Oral Oral  SpO2: 97% 93% 100% 94%  Weight:   47.9 kg   Height:      Body mass index is 15.15 kg/m.   General: Alert awake, not in obvious distress, thinly built HENT: pupils equally reacting to light,  No scleral pallor or icterus noted. Oral mucosa is moist.  Chest:  Clear breath sounds.  Diminished breath sounds bilaterally. No crackles or wheezes.  CVS: S1 &S2 heard. No murmur.  Regular rate and rhythm. Abdomen: Soft, nontender, scar from recent laparotomy with dressing.  Bowel sounds are heard.   Extremities: No cyanosis, clubbing or edema.  Peripheral pulses are palpable. Psych: Alert, awake and oriented, normal mood CNS:  No cranial nerve deficits.  Power equal in all extremities.   Skin: Warm and dry.  No rashes noted.  The results of significant diagnostics from this hospitalization (including imaging, microbiology, ancillary and laboratory) are listed below for reference.     Diagnostic Studies:   CT Angio Abd/Pel w/ and/or w/o  Result Date: 06/28/2022 CLINICAL DATA:  Unintentional 60 pound weight loss over 6 months Abdominal pain Vomiting Diarrhea. EXAM: CTA ABDOMEN AND PELVIS WITHOUT AND WITH CONTRAST TECHNIQUE: Multidetector  CT imaging of the abdomen and pelvis was performed using the standard protocol during bolus administration of intravenous contrast. Multiplanar reconstructed images and MIPs were obtained and reviewed to evaluate the vascular anatomy. RADIATION DOSE REDUCTION: This exam was performed according to the departmental dose-optimization program which includes automated exposure control, adjustment of the mA and/or kV according to patient size and/or use of iterative reconstruction technique. CONTRAST:  OMNIPAQUE IOHEXOL 350 MG/ML SOLN COMPARISON:  06/26/2022 11/16/2021 FINDINGS: VASCULAR Aorta: Calcified atheromatous plaque seen throughout the abdominal aorta without aneurysm or flow-limiting stenosis. Thin linear web-like structure seen across the lumen of the aorta on images 37 and 46 of series 5 are favored to be remodeled plaque rather than nonflow-limiting dissections. Celiac: Mild narrowing of the origin due to calcified plaque. No significant narrowing of splenic, left gastric, or common hepatic arteries. SMA: No significant narrowing of the superior mesenteric artery. Renals: Co dominant main right renal arteries are patent without significant stenosis. Moderate to severe stenosis at the origin of the left main renal artery, best seen on image 70 of series 8. Accessory left renal artery originates approximately 2 cm below the main left renal artery and is mildly narrowed at the origin. IMA: Mild narrowing at the origin of the inferior mesenteric artery. 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 Severe stenosis of the  origin of the right internal iliac artery which is otherwise patent. Right external iliac artery is diffusely calcified but patent without significant stenosis. Moderate focal stenosis of the proximal left common femoral artery predominately due to noncalcified plaque. Near complete occlusion at  the origin of the internal iliac artery which is otherwise patent. Mild diffuse narrowing of the left external iliac artery due to calcified plaque. Proximal Outflow: No significant stenosis of the visualized outflow arteries. Veins: Hepatic, portal, superior mesenteric, and splenic veins are patent without significant stenosis. Reflux of contrast in the hepatic veins on arterial phase images suggestive of elevated right heart pressures. Review of the MIP images confirms the above findings. NON-VASCULAR Lower chest: Moderate emphysematous changes the lung bases. Hepatobiliary: 9 mm simple cyst in the right liver lobe does not require dedicated imaging follow-up. 7 mm left hepatic lobe lesion (image 15, series 7) demonstrates peripheral puddling on prior CT from 11/16/2021 and most likely represents a small hemangioma. No suspicious liver lesions. No bile duct dilatation. Gallbladder is unremarkable. Pancreas: Unremarkable. No pancreatic ductal dilatation or surrounding inflammatory changes. Spleen: Normal in size without focal abnormality. Adrenals/Urinary Tract: Adrenal glands are unremarkable. 2.5 cm simple right renal cyst does not require dedicated imaging follow-up. Subcentimeter left renal hypodensities are too small to fully characterize, however also most likely represent simple cysts and do not require dedicated imaging follow-up. Kidneys and ureters otherwise unremarkable. There has been progressive increase in size of mass within the posterolateral bladder which now measures 3.4 x 1.5 cm compared to 1.8 x 1.1 cm on prior study from 11/16/2021. This is suspicious for primary bladder neoplasm. Stomach/Bowel: There is thickening of the wall of the distal esophagus which may be due to esophagitis, however underlying mass is not excluded. The degree of thickening has increased since 11/16/2021. 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 Interval resolution of previously seen small bowel dilation consistent  with resolved obstruction. There is circumferential masslike thickening of the wall of the terminal ileum, best seen on images 67-86 of series 14. Sigmoid diverticulosis without evidence of acute diverticulitis. Lymphatic: No enlarged abdominal or pelvic lymph nodes. Reproductive: Unchanged moderate prostatomegaly. Other: No abdominal wall hernia or abnormality. No abdominopelvic ascites. There has been interval increase of peritoneal soft tissue thickening along the right paracolic gutter, best seen on image 34 of series 7. This is suspicious for peritoneal carcinomatosis. Musculoskeletal: No acute or significant osseous findings. IMPRESSION: 1. Circumferential masslike thickening of the wall of the terminal ileum is suspicious for primary neoplasm. Progressive peritoneal soft tissue thickening along the right paracolic gutter suspicious for carcinomatosis. 2. Interval increase in size of mass within the posterolateral left bladder, now measuring 3.4 x 1.5 cm compared to 1.8 x 1.1 cm on prior study from 11/16/2021. This is suspicious for primary bladder neoplasm. 3. Interval resolution of previously seen small bowel dilation consistent with resolved obstruction. 4. Distal esophageal wall thickening has increased since 11/16/2021. This may be due to esophagitis, however underlying mass is not excluded. Endoscopy should be considered. 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 5. 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 6. 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 7. Interval increase in size of mass within the posterolateral bladder. This is suspicious for primary bladder neoplasm. 8. Sigmoid diverticulosis without evidence of acute diverticulitis. Electronically Signed   By: Acquanetta Belling M.D.   On: 06/28/2022 14:22   DG Abd Portable 1V-Small Bowel Obstruction Protocol-initial, 8 hr  delay  Result Date:  06/27/2022 CLINICAL DATA:  82 year old male with evidence of small bowel obstruction on CT Abdomen and Pelvis yesterday. Refused NG tube. EXAM: PORTABLE ABDOMEN - 1 VIEW COMPARISON:  CT Abdomen and Pelvis yesterday. FINDINGS: Portable AP supine view at 0619 hours. Oral contrast is now present in small and large bowel loops, including throughout the large bowel to the level of the rectum. Some flocculated contrast in left abdominal small bowel, but no evidence of dilated small bowel loops now. Lung bases appear stable, negative. Stable visualized osseous structures. IMPRESSION: Oral contrast has been administered and the bowel-gas pattern has essentially normalized since the CT Abdomen and Pelvis yesterday. Enteric contrast present to the rectum. Electronically Signed   By: Odessa Fleming M.D.   On: 06/27/2022 07:07   CT ABDOMEN PELVIS WO CONTRAST  Result Date: 06/26/2022 CLINICAL DATA:  Left lower quadrant abdominal pain EXAM: CT ABDOMEN AND PELVIS WITHOUT CONTRAST TECHNIQUE: Multidetector CT imaging of the abdomen and pelvis was performed following the standard protocol without IV contrast. RADIATION DOSE REDUCTION: This exam was performed according to the departmental dose-optimization program which includes automated exposure control, adjustment of the mA and/or kV according to patient size and/or use of iterative reconstruction technique. COMPARISON:  11/16/2021 FINDINGS: Lower chest: Advanced changes of emphysema. No pleural fluid or airspace disease. Several irregular nodular densities are identified within the right lower lobe. The largest measures 0.8 cm in appears new from previous exam, image 13/5. Hepatobiliary: Posterior right lobe of liver cyst appears unchanged measuring 9 mm. No suspicious liver abnormality. Gallbladder appears within normal limits. No bile duct dilatation. Pancreas: Unremarkable. No pancreatic ductal dilatation or surrounding inflammatory changes. Spleen: Normal in size without focal  abnormality. Adrenals/Urinary Tract: Normal adrenal glands. No nephrolithiasis or hydronephrosis. Simple right kidney cyst is unchanged measuring 1.9 cm, image 20/2. No follow-up imaging recommended. The urinary bladder contains multiple tiny layering hyperdensities within the dependent portion of the bladder which appear increased from the previous exam and may represent debris and/or stones. Stomach/Bowel: Stomach appears normal. There are several loops of abnormal dilated small bowel within the central abdomen with air-fluid levels. These measure up to 4.7 cm. Transition to decreased caliber small bowel noted within the lower abdomen and pelvis, image 53/3. The distal small bowel loops are decreased in caliber. No pathologic dilatation of the colon. Extensive sigmoid diverticulosis without signs of acute diverticulitis. Vascular/Lymphatic: Aortic atherosclerosis. No aneurysm. No abdominopelvic adenopathy identified. Reproductive: Spondylosis identified within the lumbar spine. Other: The S no free fluid or fluid collections. No signs of pneumoperitoneum. Musculoskeletal: No acute or significant osseous findings. IMPRESSION: 1. Examination is positive for small bowel obstruction with transition to decreased caliber small bowel within the lower abdomen/ pelvis. 2. Sigmoid diverticulosis without signs of acute diverticulitis. 3. Multiple tiny layering hyperdensities within the dependent portion of the bladder may represent debris and/or stones. These appear increased when compared with the previous exam. 4. Several irregular nodular densities are identified within the right lower lobe. The largest measures 0.8 cm in appears new from previous exam. Per Fleischner Society Guidelines, recommend a non-contrast Chest CT at 3-6 months, then another non-contrast Chest CT at 18-24 months. 5.  Aortic Atherosclerosis (ICD10-I70.0). Electronically Signed   By: Signa Kell M.D.   On: 06/26/2022 13:26     Labs:   Basic  Metabolic Panel: Recent Labs  Lab 06/29/22 1051 06/30/22 0743 07/01/22 0023 07/02/22 0121 07/03/22 0045  NA 136  --  135 134* 132*  K 4.0  --  4.8 3.9 4.1  CL 99  --  100 100 94*  CO2 26  --  25 26 27   GLUCOSE 96  --  126* 91 140*  BUN 13  --  16 20 27*  CREATININE 0.92  --  1.11 0.99 1.22  CALCIUM 9.0  --  8.6* 8.6* 8.7*  MG  --  2.0 1.9 2.1  --    GFR Estimated Creatinine Clearance: 32.7 mL/min (by C-G formula based on SCr of 1.22 mg/dL). Liver Function Tests: Recent Labs  Lab 06/29/22 1051 07/01/22 0023  AST 20 19  ALT 17 16  ALKPHOS 78 68  BILITOT 0.6 0.7  PROT 6.3* 5.5*  ALBUMIN 3.7 3.0*   No results for input(s): "LIPASE", "AMYLASE" in the last 168 hours. No results for input(s): "AMMONIA" in the last 168 hours. Coagulation profile No results for input(s): "INR", "PROTIME" in the last 168 hours.  CBC: Recent Labs  Lab 06/29/22 1051 07/01/22 0023 07/02/22 0121 07/03/22 0045  WBC 6.9 16.9* 11.1* 14.2*  HGB 14.0 12.6* 11.8* 14.1  HCT 41.8 37.5* 34.5* 41.3  MCV 92.5 94.2 91.8 92.2  PLT 304 268 264 341   Cardiac Enzymes: No results for input(s): "CKTOTAL", "CKMB", "CKMBINDEX", "TROPONINI" in the last 168 hours. BNP: Invalid input(s): "POCBNP" CBG: No results for input(s): "GLUCAP" in the last 168 hours. D-Dimer No results for input(s): "DDIMER" in the last 72 hours. Hgb A1c No results for input(s): "HGBA1C" in the last 72 hours. Lipid Profile No results for input(s): "CHOL", "HDL", "LDLCALC", "TRIG", "CHOLHDL", "LDLDIRECT" in the last 72 hours. Thyroid function studies No results for input(s): "TSH", "T4TOTAL", "T3FREE", "THYROIDAB" in the last 72 hours.  Invalid input(s): "FREET3" Anemia work up No results for input(s): "VITAMINB12", "FOLATE", "FERRITIN", "TIBC", "IRON", "RETICCTPCT" in the last 72 hours. Microbiology Recent Results (from the past 240 hour(s))  Gastrointestinal Panel by PCR , Stool     Status: None   Collection Time: 06/27/22   4:06 PM   Specimen: Stool  Result Value Ref Range Status   Campylobacter species NOT DETECTED NOT DETECTED Final   Plesimonas shigelloides NOT DETECTED NOT DETECTED Final   Salmonella species NOT DETECTED NOT DETECTED Final   Yersinia enterocolitica NOT DETECTED NOT DETECTED Final   Vibrio species NOT DETECTED NOT DETECTED Final   Vibrio cholerae NOT DETECTED NOT DETECTED Final   Enteroaggregative E coli (EAEC) NOT DETECTED NOT DETECTED Final   Enteropathogenic E coli (EPEC) NOT DETECTED NOT DETECTED Final   Enterotoxigenic E coli (ETEC) NOT DETECTED NOT DETECTED Final   Shiga like toxin producing E coli (STEC) NOT DETECTED NOT DETECTED Final   Shigella/Enteroinvasive E coli (EIEC) NOT DETECTED NOT DETECTED Final   Cryptosporidium NOT DETECTED NOT DETECTED Final   Cyclospora cayetanensis NOT DETECTED NOT DETECTED Final   Entamoeba histolytica NOT DETECTED NOT DETECTED Final   Giardia lamblia NOT DETECTED NOT DETECTED Final   Adenovirus F40/41 NOT DETECTED NOT DETECTED Final   Astrovirus NOT DETECTED NOT DETECTED Final   Norovirus GI/GII NOT DETECTED NOT DETECTED Final   Rotavirus A NOT DETECTED NOT DETECTED Final   Sapovirus (I, II, IV, and V) NOT DETECTED NOT DETECTED Final    Comment: Performed at [facility], [address] [place], [place] [address]     Discharge Instructions:   Discharge Instructions     Diet general   Complete by: As directed    Discharge instructions   Complete by: As directed  Follow-up with your primary care physician in 1 week.  Check blood work at that time.  Follow-up with Dr Myrle Sheng oncology in 2 weeks to discuss about cancer treatment plan..  Follow-up with general surgery on 07/14/2022 for staple removal.  Follow-up with urology as outpatient in 2 weeks for bladder mass..  Seek medical attention for worsening symptoms.   Increase activity slowly   Complete by: As directed       Allergies as of 07/04/2022   No Known Allergies       Medication List     TAKE these medications    atorvastatin 40 MG tablet Commonly known as: LIPITOR TAKE 1 TABLET AT BEDTIME (MUST KEEP UPCOMING APPOINTMENT IN NOVEMBER 2023 WITH DR. Clifton James BEFORE ANY MORE REFILLS)   clopidogrel 75 MG tablet Commonly known as: PLAVIX TAKE 1 TABLET DAILY (MUST KEEP UPCOMING APPOINTMENT IN NOVEMBER 2023 WITH DR. Clifton James BEFORE ANY MORE REFILLS)   docusate sodium 100 MG capsule Commonly known as: COLACE Take 1 capsule (100 mg total) by mouth 2 (two) times daily. Hold for diarrhoea   famotidine 20 MG tablet Commonly known as: PEPCID Take 20 mg by mouth daily.   feeding supplement Liqd Take 237 mLs by mouth 2 (two) times daily between meals.   losartan 25 MG tablet Commonly known as: COZAAR TAKE 1 TABLET DAILY (MUST KEEP UPCOMING APPOINTMENT IN NOVEMBER 2023 WITH DR. Clifton James BEFORE ANY MORE REFILLS)   metoprolol succinate 25 MG 24 hr tablet Commonly known as: TOPROL-XL Take 1 tablet (25 mg total) by mouth daily.   multivitamin with minerals tablet Take 1 tablet by mouth daily.   nitroGLYCERIN 0.4 MG SL tablet Commonly known as: NITROSTAT Place 1 tablet (0.4 mg total) under the tongue as directed. What changed:  when to take this reasons to take this   polyethylene glycol 17 g packet Commonly known as: MIRALAX / GLYCOLAX Take 17 g by mouth daily as needed for moderate constipation or severe constipation (if not helped with colace).   ProAir HFA 108 (90 Base) MCG/ACT inhaler Generic drug: albuterol Inhale 1 puff into the lungs every 4 (four) hours as needed for shortness of breath.   Spiriva Respimat 2.5 MCG/ACT Aers Generic drug: Tiotropium Bromide Monohydrate Inhale 2 puffs into the lungs daily.   Symbicort 160-4.5 MCG/ACT inhaler Generic drug: budesonide-formoterol Inhale 2 puffs into the lungs daily.   traMADol 50 MG tablet Commonly known as: ULTRAM Take 1 tablet (50 mg total) by mouth every 6 (six) hours as needed for  moderate pain or severe pain.   triamcinolone cream 0.1 % Commonly known as: KENALOG Apply 1 Application topically 2 (two) times daily.        Follow-up Information     Manus Rudd, MD. Go on 07/21/2022.   Specialty: General Surgery Why: 9:50 AM Please arrive 15 min prior to appointment time to check in. Contact information: [address] [place] [address] [phone]         Surgery, [place]. Go on 07/14/2022.   Specialty: General Surgery Why: 10 AM For staple removal, call to confirm time. Please arrive 20 min early to check in. Contact information: [address] [place] [address] [phone]         Ladene Artist, MD Follow up in 2 week(s).   Specialty: Oncology Contact information: [address] [place] [address] [phone]         Bjorn Pippin, MD. Call in 1  week(s).   Specialty: Urology Why: bladderr mass followup Contact information: [address] [place] [address] [phone]                  Time coordinating discharge: 39 minutes  Signed:  Kennett Symes  [facility] 07/04/2022, 11:43 AM

## 2022-07-04 NOTE — Progress Notes (Signed)
Progress Note  4 Days Post-Op  Subjective: Pt reports he tolerated liquids the rest of the day yesterday. He had a BM and is passing more flatus. He had some chicken noodle soup yesterday and that actually tasted good to him. He wants to go home.   Objective: Vital signs in last 24 hours: Temp:  [97.8 F (36.6 C)-98.2 F (36.8 C)] 97.8 F (36.6 C) (05/21 0829) Pulse Rate:  [78-85] 78 (05/21 0829) Resp:  [15-16] 16 (05/21 0829) BP: (132-151)/(70-84) 132/70 (05/21 0829) SpO2:  [93 %-100 %] 94 % (05/21 0829) Weight:  [47.9 kg] 47.9 kg (05/21 0410) Last BM Date : 06/30/22  Intake/Output from previous day: 05/20 0701 - 05/21 0700 In: 240 [P.O.:240] Out: -  Intake/Output this shift: No intake/output data recorded.  PE: General: pleasant, WD, thin male who is laying in bed in NAD Heart: regular, rate, and rhythm.   Lungs: CTAB, no wheezes, rhonchi, or rales noted.  Respiratory effort nonlabored Abd: soft, NT, ND, +BS, incision C/D/I with staples present Psych: A&Ox3 with an appropriate affect.    Lab Results:  Recent Labs    07/02/22 0121 07/03/22 0045  WBC 11.1* 14.2*  HGB 11.8* 14.1  HCT 34.5* 41.3  PLT 264 341    BMET Recent Labs    07/02/22 0121 07/03/22 0045  NA 134* 132*  K 3.9 4.1  CL 100 94*  CO2 26 27  GLUCOSE 91 140*  BUN 20 27*  CREATININE 0.99 1.22  CALCIUM 8.6* 8.7*    PT/INR No results for input(s): "LABPROT", "INR" in the last 72 hours. CMP     Component Value Date/Time   NA 132 (L) 07/03/2022 0045   K 4.1 07/03/2022 0045   CL 94 (L) 07/03/2022 0045   CO2 27 07/03/2022 0045   GLUCOSE 140 (H) 07/03/2022 0045   BUN 27 (H) 07/03/2022 0045   CREATININE 1.22 07/03/2022 0045   CALCIUM 8.7 (L) 07/03/2022 0045   PROT 5.5 (L) 07/01/2022 0023   ALBUMIN 3.0 (L) 07/01/2022 0023   AST 19 07/01/2022 0023   ALT 16 07/01/2022 0023   ALKPHOS 68 07/01/2022 0023   BILITOT 0.7 07/01/2022 0023   GFRNONAA 60 (L) 07/03/2022 0045   GFRAA  11/06/2008  1030    >60        The eGFR has been calculated using the MDRD equation. This calculation has not been validated in all clinical situations. eGFR's persistently <60 mL/min signify possible Chronic Kidney Disease.   Lipase     Component Value Date/Time   LIPASE 32 06/26/2022 1259       Studies/Results: DG Abd Portable 1V  Result Date: 07/03/2022 CLINICAL DATA:  Nausea and vomiting EXAM: PORTABLE ABDOMEN - 1 VIEW COMPARISON:  X-ray 06/27/2022 and CT angiogram FINDINGS: Midline skin staples identified along the lower abdomen and pelvis. Gas is seen along several loops of small and large bowel. Loops are nondilated but there is diffuse bowel gas. There is air in the rectum. There is some residual contrast in the colon with what appear to be diverticula. There does appear to be some free air but the patient is postop. Please correlate with clinical history. Degenerative changes. IMPRESSION: Postop changes identified with midline skin staples as well as some free air. Please correlate with surgical history. Diffuse bowel gas identified and small and large bowel. A few loops are distended but no clear dilatation or obstruction at this time. Air in the rectum. Some residual contrast in the colon  with what appear to be diverticula. Electronically Signed   By: Karen Kays M.D.   On: 07/03/2022 10:36    Anti-infectives: Anti-infectives (From admission, onward)    Start     Dose/Rate Route Frequency Ordered Stop   06/30/22 0800  ceFAZolin (ANCEF) IVPB 2g/100 mL premix  Status:  Discontinued       See Hyperspace for full Linked Orders Report.   2 g 200 mL/hr over 30 Minutes Intravenous To ShortStay Surgical 06/29/22 1000 06/29/22 1436   06/30/22 0800  metroNIDAZOLE (FLAGYL) IVPB 500 mg  Status:  Discontinued       See Hyperspace for full Linked Orders Report.   500 mg 100 mL/hr over 60 Minutes Intravenous To ShortStay Surgical 06/29/22 1000 06/29/22 1436   06/30/22 0600  cefTRIAXone  (ROCEPHIN) 2 g in sodium chloride 0.9 % 100 mL IVPB       See Hyperspace for full Linked Orders Report.   2 g 200 mL/hr over 30 Minutes Intravenous On call to O.R. 06/29/22 1436 06/30/22 1000   06/30/22 0600  metroNIDAZOLE (FLAGYL) IVPB 500 mg       See Hyperspace for full Linked Orders Report.   500 mg 100 mL/hr over 60 Minutes Intravenous On call to O.R. 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  - CEA mildly elevated at 5.1 - surgical path pending - uro consult for bladder mass - outpatient work up - GI consult for esophageal thickening - no repeat EGD at this time and signed off - Dr. Truett Perna saw and plans follow up in 2 weeks  - advance to reg diet this AM, ok to DC home today if tolerating - will put surgical follow up in chart   FEN: Reg diet  ID: rocephin/flagyl periop VTE: lovenox Foley: voiding   LOS: 7 days     Juliet Rude, [facility] Surgery 07/04/2022, 10:07 AM Please see Amion for pager number during day hours 7:00am-4:30pm

## 2022-07-04 NOTE — Progress Notes (Signed)
Patient is wanting pharmacy changed to [facility]. MD notified but has not been updated in computer currently. Pt verbalized he is not wanting to wait on new paper work to reflect pharmacy change.

## 2022-07-04 NOTE — TOC Progression Note (Signed)
Transition of Care (TOC) - Progression Note   NCM faxed update to patient's [facility] PCP  Janace Aris fax [phone]  Patient Details  Name: Austin [name] MRN: [id number] Date of Birth: 01-Mar-1940  Transition of Care [facility]) CM/SW Contact  Domenique Quest, Adria Devon, RN Phone Number: 07/04/2022, 7:46 AM  Clinical Narrative:       Expected Discharge Plan: Home/Self Care Barriers to Discharge: Continued Medical Work up  Expected Discharge Plan and Services   Discharge Planning Services: CM Consult   Living arrangements for the past 2 months: Single Family Home                                       Social Determinants of Health (SDOH) Interventions SDOH Screenings   Food Insecurity: No Food Insecurity (06/26/2022)  Housing: Low Risk  (06/26/2022)  Transportation Needs: No Transportation Needs (06/26/2022)  Utilities: Not At Risk (06/26/2022)  Tobacco Use: Medium Risk (07/01/2022)    Readmission Risk Interventions     No data to display

## 2022-07-05 LAB — SURGICAL PATHOLOGY

## 2022-07-17 ENCOUNTER — Encounter: Payer: Self-pay | Admitting: *Deleted

## 2022-07-17 ENCOUNTER — Other Ambulatory Visit: Payer: Self-pay | Admitting: *Deleted

## 2022-07-17 ENCOUNTER — Inpatient Hospital Stay: Payer: Medicare (Managed Care) | Attending: Oncology | Admitting: Oncology

## 2022-07-17 ENCOUNTER — Telehealth: Payer: Self-pay | Admitting: Oncology

## 2022-07-17 VITALS — BP 123/66 | HR 67 | Temp 98.2°F | Resp 18 | Ht 70.0 in | Wt 101.7 lb

## 2022-07-17 DIAGNOSIS — C172 Malignant neoplasm of ileum: Secondary | ICD-10-CM | POA: Insufficient documentation

## 2022-07-17 DIAGNOSIS — Z9049 Acquired absence of other specified parts of digestive tract: Secondary | ICD-10-CM | POA: Diagnosis not present

## 2022-07-17 DIAGNOSIS — I2510 Atherosclerotic heart disease of native coronary artery without angina pectoris: Secondary | ICD-10-CM | POA: Diagnosis not present

## 2022-07-17 DIAGNOSIS — C180 Malignant neoplasm of cecum: Secondary | ICD-10-CM

## 2022-07-17 DIAGNOSIS — C179 Malignant neoplasm of small intestine, unspecified: Secondary | ICD-10-CM | POA: Insufficient documentation

## 2022-07-17 DIAGNOSIS — J449 Chronic obstructive pulmonary disease, unspecified: Secondary | ICD-10-CM | POA: Insufficient documentation

## 2022-07-17 DIAGNOSIS — N3289 Other specified disorders of bladder: Secondary | ICD-10-CM | POA: Insufficient documentation

## 2022-07-17 NOTE — Telephone Encounter (Signed)
Spoke with patient confirming upcoming appointment  

## 2022-07-17 NOTE — Progress Notes (Unsigned)
Dr Truett Perna ordered MMR and MSI testing on accession number [id number].  Will continue to monitor for results

## 2022-07-17 NOTE — Progress Notes (Signed)
[facility] OFFICE PROGRESS NOTE   Diagnosis: Small bowel carcinoma  INTERVAL HISTORY:   I saw [name] when he was hospitalized earlier this month with a bowel obstruction.  He underwent a diagnostic laparoscopy and right hemicolectomy.  A diagnosis of a tumor of the ileum with carcinomatosis.  He was discharged from the hospital on 07/04/2022.  He reports feeling better compared to hospital admission.  He is having bowel movements and he is tolerating a diet.  Nausea is relieved with Zofran.  Abdominal wound staples were removed last week.  Austin [name] is here today with his wife and sons.  Objective:  Vital signs in last 24 hours:  Blood pressure 123/66, pulse 67, temperature 98.2 F (36.8 C), temperature source Oral, resp. rate 18, height 5\' 10"  (1.778 m), weight 101 lb 11.2 oz (46.1 kg), SpO2 100 %.    Resp: Distant breath sounds, no respiratory distress Cardio: Regular rate and rhythm GI: Midline incision with Steri-Strips, no erythema or discharge, mild tenderness in the right upper abdomen, no mass, no apparent ascites, no hepatosplenomegaly Vascular: No leg edema   Lab Results:  Lab Results  Component Value Date   WBC 14.2 (H) 07/03/2022   HGB 14.1 07/03/2022   HCT 41.3 07/03/2022   MCV 92.2 07/03/2022   PLT 341 07/03/2022   NEUTROABS 8.1 (H) 06/26/2022    CMP  Lab Results  Component Value Date   NA 132 (L) 07/03/2022   K 4.1 07/03/2022   CL 94 (L) 07/03/2022   CO2 27 07/03/2022   GLUCOSE 140 (H) 07/03/2022   BUN 27 (H) 07/03/2022   CREATININE 1.22 07/03/2022   CALCIUM 8.7 (L) 07/03/2022   PROT 5.5 (L) 07/01/2022   ALBUMIN 3.0 (L) 07/01/2022   AST 19 07/01/2022   ALT 16 07/01/2022   ALKPHOS 68 07/01/2022   BILITOT 0.7 07/01/2022   GFRNONAA 60 (L) 07/03/2022   GFRAA  11/06/2008    >60        The eGFR has been calculated using the MDRD equation. This calculation has not been validated in all clinical situations. eGFR's  persistently <60 mL/min signify possible Chronic Kidney Disease.    Lab Results  Component Value Date   CEA1 5.1 (H) 07/01/2022     Medications: I have reviewed the patient's current medications.   Assessment/Plan: Small bowel carcinoma-stage IV (pT4,pN1,pM1 Admission 06/26/2022 with a small bowel obstruction CT abdomen/pelvis 06/26/2022-small bowel obstruction with transition in the lower abdomen/pelvis, irregular nodular densities in the right lower lobe CT angiogram abdomen/pelvis 06/27/2022-circumferential masslike thickening at the terminal ileum, progressive peritoneal soft tissue at the right paracolic gutter, increase in a posterior lateral left bladder mass, resolution of small bowel dilation, increased distal esophageal wall thickening compared to October 2023 06/30/2022-exploratory laparotomy, right hemicolectomy, and peritoneal biopsy-firm mass at the distal ileum with peritoneal implants-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, tumor perforates the visceral peritoneum and invades the cecum, negative resection margins, 2/13 nodes, peritoneal implant-moderately differentiated mucinous adenocarcinoma   2.  COPD 3.  CAD 4.  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  Enlarging bladder mass on CT 06/27/2022 6.  Mother with cervical cancer       Disposition: [name] has been diagnosed with metastatic adenocarcinoma of the distal ileum.  He underwent a right colectomy for relief of a bowel obstruction.  He continues to recover from surgery.  His clinical status has improved following surgery.  I discussed the diagnosis, prognosis,  and treatment options with [name] and his family.  He understands no therapy will be curative.  We discussed systemic treatment options including chemotherapy, biologic therapy, and immunotherapy.  We will submit the resected tumor for molecular testing and mismatch repair protein testing.  We will refer him to the  cancer center nutritionist.  [name] does not appear to have hereditary nonpolyposis colon cancer syndrome, but his family members are at potential increased risk of developing a gastrointestinal malignancy.  They should receive appropriate screening for colorectal cancer.  A bladder mass was noted on the 06/27/2022 CT.  We will check a urine cytology when he returns in 4 weeks.  The bladder mass could be related to a metastasis from small bowel carcinoma or a separate primary.  [name] will return for an office visit and further discussion in approximately 4 weeks.  We will refer him for baseline imaging in approximately 2 months to look for evidence of measurable disease.  Thornton Papas, MD  07/17/2022  11:26 AM

## 2022-07-17 NOTE — Progress Notes (Signed)
PATIENT NAVIGATOR PROGRESS NOTE  Name: Austin [name] Date: 07/17/2022 MRN: [id number]  DOB: Apr 30, 1940   Reason for visit:  New patient appt  Comments:  Met with Mr Sandor and family during visit with Dr Truett Perna MMR and MSI testing requested on accession number [id number] Provided Journeys notebook and disease specific information Referral placed to nutrition Referral placed to Social work Provided contact information to call with any issues or questions    Time spent counseling/coordinating care: > 60 minutes

## 2022-07-18 ENCOUNTER — Inpatient Hospital Stay: Payer: Medicare (Managed Care) | Admitting: Licensed Clinical Social Worker

## 2022-07-18 NOTE — Progress Notes (Signed)
[facility] Clinical Social Work  Initial Assessment   Austin [name] is a 82 y.o. year old male contacted by phone. Clinical Social Work was referred by nurse navigator for assessment of psychosocial needs.   SDOH (Social Determinants of Health) assessments performed: Yes SDOH Interventions    Flowsheet Row Clinical Support from 07/18/2022 in [facility]  SDOH Interventions   Financial Strain Interventions Intervention Not Indicated       SDOH Screenings   Food Insecurity: No Food Insecurity (06/26/2022)  Housing: Low Risk  (06/26/2022)  Transportation Needs: No Transportation Needs (06/26/2022)  Utilities: Not At Risk (06/26/2022)  Financial Resource Strain: Low Risk  (07/18/2022)  Tobacco Use: Medium Risk (07/01/2022)     Distress Screen completed: No     No data to display            Family/Social Information:  Housing Arrangement: patient lives with his wife, [name]. Family members/support persons in your life? Family.  Patient has been married for 53 years.  He has a son in [place] and a son [name].  The son in [place] has two children.  Both sons are Captains with the [employer].  He visits and talks to them on the phone with them often. Transportation concerns: no  Employment: Retired.  Patient is a disabled veteran who served in the [place] War. 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 Services Currently in place:  [employer] and [employer].  Coping/ Adjustment to diagnosis: Patient understands treatment plan and what happens next? yes Concerns about diagnosis and/or treatment: Quality of life Patient reported stressors: Adjusting to my illness Hopes and/or priorities: Family. Patient enjoys time with family/ friends Current coping skills/ strengths: Capable of independent living ,  Manufacturing systems engineer , Contractor , General fund of knowledge , and Supportive family/friends     SUMMARY: Current SDOH Barriers:  Patient did not identify any.  Clinical Social Work Clinical Goal(s):  No clinical social work goals at this time  Interventions: Discussed common feeling and emotions when being diagnosed with cancer, and the importance of support during treatment Informed patient of the support team roles and support services at [facility] Provided CSW contact information and encouraged patient to call with any questions or concerns Provided patient with information about the [employer].  He agreed to have CSW mail brochure and business card.   Follow Up Plan: Patient will contact CSW with any support or resource needs Patient verbalizes understanding of plan: Yes    Dorothey Baseman, LCSW Clinical Social Worker [facility]

## 2022-07-21 ENCOUNTER — Encounter: Payer: Self-pay | Admitting: *Deleted

## 2022-07-21 NOTE — Progress Notes (Signed)
FLMA nurse sent forms back to office to hold till treatment plan is determined. Not able to complete till treatment plan is in place. Forms were for son, Austin [name]. Will hold till 6/28 appointment.

## 2022-07-25 ENCOUNTER — Encounter (HOSPITAL_COMMUNITY): Payer: Self-pay

## 2022-08-09 ENCOUNTER — Inpatient Hospital Stay: Payer: Medicare (Managed Care) | Admitting: Nutrition

## 2022-08-09 ENCOUNTER — Encounter: Payer: Self-pay | Admitting: Nutrition

## 2022-08-09 NOTE — Progress Notes (Signed)
Patient did not show up for nutrition appointment. 

## 2022-08-11 ENCOUNTER — Inpatient Hospital Stay (HOSPITAL_BASED_OUTPATIENT_CLINIC_OR_DEPARTMENT_OTHER): Payer: Medicare (Managed Care) | Admitting: Oncology

## 2022-08-11 VITALS — BP 113/69 | HR 64 | Temp 98.1°F | Resp 18 | Ht 70.0 in | Wt 102.5 lb

## 2022-08-11 DIAGNOSIS — C172 Malignant neoplasm of ileum: Secondary | ICD-10-CM | POA: Diagnosis not present

## 2022-08-11 DIAGNOSIS — C179 Malignant neoplasm of small intestine, unspecified: Secondary | ICD-10-CM | POA: Diagnosis not present

## 2022-08-11 NOTE — Progress Notes (Signed)
[facility] OFFICE PROGRESS NOTE   Diagnosis: Small bowel carcinoma  INTERVAL HISTORY:   Austin [name] returns as scheduled.  He is here with his wife and son.  He reports a good appetite.  No pain.  He believes [facility].  He is scheduled for an appointment at the [facility] in 2 weeks and will request the report.  Objective:  Vital signs in last 24 hours:  Blood pressure 113/69, pulse 64, temperature 98.1 F (36.7 C), temperature source Oral, resp. rate 18, height 5\' 10"  (1.778 m), weight 102 lb 8 oz (46.5 kg), SpO2 100 %.   Lymphatics: No cervical or supraclavicular nodes.  "Shotty "bilateral axillary and inguinal nodes Resp: Distant breath sounds, no respiratory distress Cardio: Regular rate and rhythm GI: No hepatosplenomegaly, no mass, no apparent ascites Vascular: No leg edema  Lab Results:  Lab Results  Component Value Date   WBC 14.2 (H) 07/03/2022   HGB 14.1 07/03/2022   HCT 41.3 07/03/2022   MCV 92.2 07/03/2022   PLT 341 07/03/2022   NEUTROABS 8.1 (H) 06/26/2022    CMP  Lab Results  Component Value Date   NA 132 (L) 07/03/2022   K 4.1 07/03/2022   CL 94 (L) 07/03/2022   CO2 27 07/03/2022   GLUCOSE 140 (H) 07/03/2022   BUN 27 (H) 07/03/2022   CREATININE 1.22 07/03/2022   CALCIUM 8.7 (L) 07/03/2022   PROT 5.5 (L) 07/01/2022   ALBUMIN 3.0 (L) 07/01/2022   AST 19 07/01/2022   ALT 16 07/01/2022   ALKPHOS 68 07/01/2022   BILITOT 0.7 07/01/2022   GFRNONAA 60 (L) 07/03/2022   GFRAA  11/06/2008    >60        The eGFR has been calculated using the MDRD equation. This calculation has not been validated in all clinical situations. eGFR's persistently <60 mL/min signify possible Chronic Kidney Disease.    Lab Results  Component Value Date   CEA1 5.1 (H) 07/01/2022     Medications: I have reviewed the patient's current medications.   Assessment/Plan: Small bowel carcinoma-stage IV (pT4,pN1,pM1 Admission  06/26/2022 with a small bowel obstruction CT abdomen/pelvis 06/26/2022-small bowel obstruction with transition in the lower abdomen/pelvis, irregular nodular densities in the right lower lobe CT angiogram abdomen/pelvis 06/27/2022-circumferential masslike thickening at the terminal ileum, progressive peritoneal soft tissue at the right paracolic gutter, increase in a posterior lateral left bladder mass, resolution of small bowel dilation, increased distal esophageal wall thickening compared to October 2023 06/30/2022-exploratory laparotomy, right hemicolectomy, and peritoneal biopsy-firm mass at the distal ileum with peritoneal implants-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, tumor perforates the visceral peritoneum and invades the cecum, negative resection margins, 2/13 nodes, peritoneal implant-moderately differentiated mucinous adenocarcinoma, mismatch repair protein expression intact Foundation 1-microsatellite status cannot be determined, tumor mutation burden cannot be determined, KRAS and NRAS wild-type   2.  COPD 3.  CAD 4.  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  Enlarging bladder mass on CT 06/27/2022 6.  Mother with cervical cancer     Disposition: Austin. Kolodziej appears well.  I reviewed results of the molecular testing with him.  He does not appear to be a candidate for immunotherapy, but will be a candidate for biologic therapy in addition to standard systemic chemotherapy.  He will be scheduled for a restaging CT and office visit in August.  The plan is to initiate systemic therapy if he develops symptoms  or significant CT evidence of disease progression.  We will refer him to urology if the bladder mass is again larger on the August CT.  Thornton Papas, MD  08/11/2022  11:32 AM

## 2022-08-16 ENCOUNTER — Telehealth: Payer: Self-pay | Admitting: *Deleted

## 2022-08-16 MED ORDER — ONDANSETRON HCL 8 MG PO TABS: 4.0000 mg | ORAL_TABLET | Freq: Three times a day (TID) | ORAL | 1 refills | Status: DC | PRN

## 2022-08-16 NOTE — Telephone Encounter (Signed)
Wife called to request refill on his ondansetron.

## 2022-09-25 ENCOUNTER — Ambulatory Visit (HOSPITAL_BASED_OUTPATIENT_CLINIC_OR_DEPARTMENT_OTHER)
Admission: RE | Admit: 2022-09-25 | Discharge: 2022-09-25 | Disposition: A | Discharge status: Home / Self Care | Payer: Medicare (Managed Care) | Source: Ambulatory Visit | Attending: Oncology | Admitting: Oncology

## 2022-09-25 ENCOUNTER — Inpatient Hospital Stay: Payer: Medicare (Managed Care) | Attending: Oncology

## 2022-09-25 DIAGNOSIS — C172 Malignant neoplasm of ileum: Secondary | ICD-10-CM | POA: Diagnosis present

## 2022-09-25 DIAGNOSIS — Z85828 Personal history of other malignant neoplasm of skin: Secondary | ICD-10-CM | POA: Insufficient documentation

## 2022-09-25 DIAGNOSIS — Z452 Encounter for adjustment and management of vascular access device: Secondary | ICD-10-CM | POA: Diagnosis not present

## 2022-09-25 DIAGNOSIS — Z8049 Family history of malignant neoplasm of other genital organs: Secondary | ICD-10-CM | POA: Diagnosis not present

## 2022-09-25 DIAGNOSIS — Z5111 Encounter for antineoplastic chemotherapy: Secondary | ICD-10-CM | POA: Diagnosis present

## 2022-09-25 DIAGNOSIS — C179 Malignant neoplasm of small intestine, unspecified: Secondary | ICD-10-CM | POA: Diagnosis present

## 2022-09-25 DIAGNOSIS — Z9049 Acquired absence of other specified parts of digestive tract: Secondary | ICD-10-CM | POA: Insufficient documentation

## 2022-09-25 DIAGNOSIS — N3289 Other specified disorders of bladder: Secondary | ICD-10-CM | POA: Diagnosis present

## 2022-09-25 LAB — BASIC METABOLIC PANEL - CANCER CENTER ONLY
Anion gap: 8 (ref 5–15)
BUN: 15 mg/dL (ref 8–23)
CO2: 29 mmol/L (ref 22–32)
Calcium: 9.3 mg/dL (ref 8.9–10.3)
Chloride: 102 mmol/L (ref 98–111)
Creatinine: 1.02 mg/dL (ref 0.61–1.24)
GFR, Estimated: >60 mL/min (ref >60)
Glucose, Bld: 117 mg/dL — ABNORMAL HIGH (ref 70–99)
Potassium: 3.9 mmol/L (ref 3.5–5.1)
Sodium: 139 mmol/L (ref 135–145)

## 2022-09-25 LAB — CEA (ACCESS): CEA (CHCC): 4.83 ng/mL (ref 0.00–5.00)

## 2022-09-25 MED ORDER — IOHEXOL 300 MG/ML  SOLN
100.0000 mL | Freq: Once | INTRAMUSCULAR | Status: AC | PRN
  Administered 2022-09-25: 75 mL via INTRAVENOUS

## 2022-09-29 ENCOUNTER — Inpatient Hospital Stay (HOSPITAL_BASED_OUTPATIENT_CLINIC_OR_DEPARTMENT_OTHER): Payer: Medicare (Managed Care) | Admitting: Oncology

## 2022-09-29 ENCOUNTER — Encounter: Payer: Self-pay | Admitting: Oncology

## 2022-09-29 ENCOUNTER — Other Ambulatory Visit: Payer: Self-pay | Admitting: Radiology

## 2022-09-29 ENCOUNTER — Other Ambulatory Visit: Payer: Self-pay | Admitting: *Deleted

## 2022-09-29 VITALS — BP 110/65 | HR 87 | Temp 98.1°F | Resp 18 | Ht 70.0 in | Wt 105.2 lb

## 2022-09-29 DIAGNOSIS — C179 Malignant neoplasm of small intestine, unspecified: Secondary | ICD-10-CM

## 2022-09-29 DIAGNOSIS — Z5111 Encounter for antineoplastic chemotherapy: Secondary | ICD-10-CM | POA: Diagnosis not present

## 2022-09-29 MED ORDER — ONDANSETRON 8 MG PO TBDP: 8.0000 mg | ORAL_TABLET | Freq: Three times a day (TID) | ORAL | 1 refills | Status: DC | PRN

## 2022-09-29 NOTE — Progress Notes (Signed)
START OFF PATHWAY REGIMEN - Other   [id number] (Leucovorin IV D1 + Fluorouracil IV D1/CIV D1,2 + Oxaliplatin IV D1) q14 Days:   A cycle is every 14 days:     Oxaliplatin      Leucovorin      Fluorouracil      Fluorouracil   **Always confirm dose/schedule in your pharmacy ordering system**  Patient Characteristics: Intent of Therapy: Non-Curative / Palliative Intent, Discussed with Patient

## 2022-09-29 NOTE — Progress Notes (Signed)
Austin [name]   Diagnosis: Small bowel carcinoma  INTERVAL HISTORY:   Austin [name] returns as scheduled.  He is here with his wife and son.  He reports intermittent abdominal discomfort.  No nausea or difficulty with bowel function.  No other complaint.  Objective:  Vital signs in last 24 hours:  Blood pressure 110/65, pulse 87, temperature 98.1 F (36.7 C), temperature source Oral, resp. rate 18, height 5\' 10"  (1.778 m), weight 105 lb 3.2 oz (47.7 kg), SpO2 100%.    Lymphatics: No cervical, supraclavicular, axillary, or inguinal nodes Resp: Breath sounds, no respiratory distress Cardio: Regular rate and rhythm GI: No mass, no apparent ascites, no hepatosplenomegaly Vascular: No leg edema   Lab Results:  Lab Results  Component Value Date   WBC 14.2 (H) 07/03/2022   HGB 14.1 07/03/2022   HCT 41.3 07/03/2022   MCV 92.2 07/03/2022   PLT 341 07/03/2022   NEUTROABS 8.1 (H) 06/26/2022    CMP  Lab Results  Component Value Date   NA 139 09/25/2022   K 3.9 09/25/2022   CL 102 09/25/2022   CO2 29 09/25/2022   GLUCOSE 117 (H) 09/25/2022   BUN 15 09/25/2022   CREATININE 1.02 09/25/2022   CALCIUM 9.3 09/25/2022   PROT 5.5 (L) 07/01/2022   ALBUMIN 3.0 (L) 07/01/2022   AST 19 07/01/2022   ALT 16 07/01/2022   ALKPHOS 68 07/01/2022   BILITOT 0.7 07/01/2022   GFRNONAA >60 09/25/2022   GFRAA  11/06/2008    >60        The eGFR has been calculated using the MDRD equation. This calculation has not been validated in all clinical situations. eGFR's persistently <60 mL/min signify possible Chronic Kidney Disease.    Lab Results  Component Value Date   CEA1 5.1 (H) 07/01/2022   CEA 4.83 09/25/2022    Lab Results  Component Value Date   INR 1.1 11/17/2021   LABPROT 13.8 11/17/2021    Imaging:  CT CHEST ABDOMEN PELVIS W CONTRAST  Result Date: 09/25/2022 CLINICAL DATA:  Metastatic small bowel cancer restaging * Tracking Code: BO *  EXAM: CT CHEST, ABDOMEN, AND PELVIS WITH CONTRAST TECHNIQUE: Multidetector CT imaging of the chest, abdomen and pelvis was performed following the standard protocol during bolus administration of intravenous contrast. RADIATION DOSE REDUCTION: This exam was performed according to the departmental dose-optimization program which includes automated exposure control, adjustment of the mA and/or kV according to patient size and/or use of iterative reconstruction technique. CONTRAST:  75mL OMNIPAQUE IOHEXOL 300 MG/ML  SOLN COMPARISON:  06/27/2022 FINDINGS: CT CHEST FINDINGS Cardiovascular: Aortic atherosclerosis. Normal heart size. Three-vessel coronary artery calcifications. No pericardial effusion. Mediastinum/Nodes: No enlarged mediastinal, hilar, or axillary lymph nodes. Thyroid gland, trachea, and esophagus demonstrate no significant findings. Lungs/Pleura: Severe emphysema. Diffuse bilateral bronchial wall thickening. Biapical pleural-parenchymal scarring. Clustered irregular nodularity of the right lung base, fluctuant compared to prior examination (series 5, image 133). No pleural effusion or pneumothorax. Musculoskeletal: No chest wall abnormality. No acute osseous findings. CT ABDOMEN PELVIS FINDINGS Hepatobiliary: No solid liver abnormality is seen. Unchanged fluid attenuation cysts of the liver, benign, requiring no further follow-up or characterization no gallstones, gallbladder wall thickening, or biliary dilatation. Pancreas: Unremarkable. No pancreatic ductal dilatation or surrounding inflammatory changes. Spleen: Normal in size without significant abnormality. Adrenals/Urinary Tract: Adrenal glands are unremarkable. Simple, benign bilateral renal cortical cysts, for which no specific further follow-up or characterization is required kidneys are otherwise normal, without renal calculi,  solid lesion, or hydronephrosis. Interval resection of a previously seen mass in the posterolateral right aspect of the  urinary bladder. Minimal calcific density in both this vicinity and also in the dependent left bladder, possibly postoperative material or calcific debris (series 3, image 106). Stomach/Bowel: Stomach is within normal limits. Interval partial right hemicolectomy with ileocolic reanastomosis. Severe descending and sigmoid diverticulosis. Vascular/Lymphatic: Severe aortic atherosclerosis. No enlarged abdominal or pelvic lymph nodes. Reproductive: No mass or other abnormality. Other: No abdominal wall hernia or abnormality. Diffuse omental and peritoneal nodularity, which appears significantly increased compared to prior examination (series 3, image 90). Small volume perihepatic ascites, similar to prior examination. Musculoskeletal: No acute osseous findings. IMPRESSION: 1. Interval partial right hemicolectomy with ileocolic reanastomosis. 2. Diffuse omental and peritoneal nodularity, which appears significantly increased compared to prior examination, most consistent with peritoneal carcinomatosis. 3. Interval resection of a previously seen mass in the posterolateral right aspect of the urinary bladder. Minimal calcific density in both this vicinity and also in the dependent left bladder, possibly postoperative material or calcific bladder debris. 4. Small volume perihepatic ascites, similar to prior examination, presumed malignant. 5. Clustered irregular nodularity of the right lung base, fluctuant compared to prior examination, nonspecific and infectious or inflammatory. 6. Severe emphysema and diffuse bilateral bronchial wall thickening. 7. Coronary artery disease. Aortic Atherosclerosis (ICD10-I70.0) and Emphysema (ICD10-J43.9). Electronically Signed   By: Jearld Lesch M.D.   On: 09/25/2022 17:05    Medications: I have reviewed the patient's current medications.   Assessment/Plan: Small bowel carcinoma-stage IV (pT4,pN1,pM1 Admission 06/26/2022 with a small bowel obstruction CT abdomen/pelvis  06/26/2022-small bowel obstruction with transition in the lower abdomen/pelvis, irregular nodular densities in the right lower lobe CT angiogram abdomen/pelvis 06/27/2022-circumferential masslike thickening at the terminal ileum, progressive peritoneal soft tissue at the right paracolic gutter, increase in a posterior lateral left bladder mass, resolution of small bowel dilation, increased distal esophageal wall thickening compared to October 2023 06/30/2022-exploratory laparotomy, right hemicolectomy, and peritoneal biopsy-firm mass at the distal ileum with peritoneal implants-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  COPD 3.  CAD 4.  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  Enlarging bladder mass on CT 06/27/2022 6.  Mother with cervical cancer      Disposition: Austin [name] has metastatic small cell carcinoma.  I reviewed the CT findings and images with him.  There is evidence of progressive carcinomatosis.  He has intermittent abdominal discomfort.  We discussed continued observation versus a trial of systemic therapy.  He understands no therapy will be curative.  The goals of treatment are to improve symptoms and potentially extend survival.  Austin [name] indicated he would like to begin systemic therapy.  We discussed single agent capecitabine, CAPOX, and FOLFOX.  We discussed the expected response rates with single agent therapy versus a  multi agent regimen.  He prefers FOLFOX.  We reviewed potential toxicities associated with the FOLFOX regimen including the chance of nausea/vomiting, mucositis, alopecia, diarrhea, hematologic toxicity, infection, and bleeding.  We discussed the sun sensitivity, rash, hyperpigmentation, hand/foot syndrome, and cardiac toxicity associated with 5-fluorouracil.  We reviewed  the allergic reaction various types of neuropathy seen with oxaliplatin.  He agrees to proceed.  He will attend a chemotherapy teaching class and undergo Port-A-Cath placement during the week of 10/02/2022.  He will be scheduled for an office visit and cycle 1 FOLFOX 10/10/2022.  A chemotherapy plan was entered today.  45 minutes were spent with the patient today.  The majority the time was used for counseling and coordination of care.  Thornton Papas, MD  09/29/2022  9:14 AM

## 2022-09-30 ENCOUNTER — Other Ambulatory Visit: Payer: Self-pay

## 2022-10-01 ENCOUNTER — Encounter (HOSPITAL_COMMUNITY): Payer: Self-pay

## 2022-10-01 NOTE — H&P (Signed)
Chief Complaint: Chemotherapy access. Request is for portacath placement.   Referring Physician(s): Ladene Artist  Supervising Physician: Irish Lack  Patient Status: Austin [name] - Out-pt  History of Present Illness: Austin [name] is a 82 y.o. male.  Outpatient. Smoker. History of HTN, HLD, COPD, CAD, MI. 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   Currently without any significant complaints. Patient alert and laying in bed,calm. Denies any fevers, headache, chest pain, SOB, cough, abdominal pain, nausea, vomiting or bleeding. Return precautions and treatment recommendations and follow-up discussed with the patient and his wife at beside. Both who are agreeable with the plan.    Past Medical History:  Diagnosis Date   CAD (coronary artery disease)    COPD (chronic obstructive pulmonary disease) (HCC)    Dyspnea on exertion    GERD (gastroesophageal reflux disease)    Hyperlipidemia    MI (myocardial infarction) (HCC)    Rectal bleeding    SBO (small bowel obstruction) (HCC)    Sept '23   Tobacco abuse    Unspecified essential hypertension     Past Surgical History:  Procedure Laterality Date   CORONARY ANGIOPLASTY WITH STENT PLACEMENT     Drug eluting stent placed to the first obtuse marginal with a 2.58 mm cypher drug eluting stent. Circumflex was treated with 3.0 x 18 mm TAXUS drug-eluting stent on 07/17/03   ENTEROSCOPY N/A 11/07/2021   Procedure: ENTEROSCOPY;  Surgeon: Charlott Rakes, MD;  Location: [facility] ENDOSCOPY;  Service: Gastroenterology;  Laterality: N/A;   EYE SURGERY  1971   bilateral   LAPAROSCOPY N/A 06/30/2022   Procedure: LAPAROSCOPY DIAGNOSTIC;  Surgeon: Manus Rudd, MD;  Location: [facility] OR;  Service: General;  Laterality: N/A;    LAPAROTOMY N/A 06/30/2022   Procedure: EXPLORATORY LAPAROTOMY, RIGHT HEMICOLECTOMY, PERITONEAL BIOPSY;  Surgeon: Manus Rudd, MD;  Location: [facility] OR;  Service: General;  Laterality: N/A;   PTCA     And drug-eluting stent to the RCA   RECTAL SURGERY  2008   [name] did this surgery    Allergies: Patient has no known allergies.  Medications: Prior to Admission medications   Medication Sig Start Date End Date Taking? Authorizing Provider  atorvastatin (LIPITOR) 40 MG tablet TAKE 1 TABLET AT BEDTIME (MUST KEEP UPCOMING APPOINTMENT IN NOVEMBER 2023 WITH DR. Clifton James BEFORE ANY MORE REFILLS) 03/13/22   Kathleene Hazel, MD  clopidogrel (PLAVIX) 75 MG tablet TAKE 1 TABLET DAILY (MUST KEEP UPCOMING APPOINTMENT IN NOVEMBER 2023 WITH DR. Clifton James BEFORE ANY MORE REFILLS) 03/13/22   Kathleene Hazel, MD  losartan (COZAAR) 25 MG tablet TAKE 1 TABLET DAILY (MUST KEEP UPCOMING APPOINTMENT IN NOVEMBER 2023 WITH DR. Clifton James BEFORE ANY MORE REFILLS) 03/13/22   Kathleene Hazel, MD  metoprolol succinate (TOPROL-XL) 25 MG 24 hr tablet Take 1 tablet (25 mg total) by mouth daily. 02/24/22   Kathleene Hazel, MD  Multiple Vitamins-Minerals (MULTIVITAMIN WITH MINERALS) tablet Take 1 tablet by mouth daily.    [provider]  nitroGLYCERIN (NITROSTAT) 0.4 MG SL tablet Place 1 tablet (0.4 mg total) under the tongue as directed. Patient not taking: Reported on 07/17/2022 11/10/19   Kathleene Hazel, MD  ondansetron (ZOFRAN-ODT) 8 MG disintegrating tablet Take 1 tablet (8 mg total) by mouth every 8 (eight) hours as needed for nausea or vomiting. 09/29/22  Ladene Artist, MD  polyethylene glycol (MIRALAX / GLYCOLAX) 17 g packet Take 17 g by mouth daily as needed for moderate constipation or severe constipation (if not helped with colace). Patient not taking: Reported on 07/17/2022 07/04/22   Joycelyn Das, MD  PROAIR HFA 108 [phone] Base) MCG/ACT inhaler Inhale 1 puff into the lungs  every 4 (four) hours as needed for shortness of breath. 01/22/16   [provider]  SPIRIVA RESPIMAT 2.5 MCG/ACT AERS Inhale 2 puffs into the lungs daily. 12/23/18   [provider]  SYMBICORT 160-4.5 MCG/ACT inhaler Inhale 2 puffs into the lungs daily. 12/23/18   [provider]  traMADol (ULTRAM) 50 MG tablet Take 1 tablet (50 mg total) by mouth every 6 (six) hours as needed for moderate pain or severe pain. Patient not taking: Reported on 07/17/2022 07/04/22   Joycelyn Das, MD  triamcinolone cream (KENALOG) 0.1 % Apply 1 Application topically 2 (two) times daily. Patient not taking: Reported on 08/11/2022 11/04/20   [provider]     Family History  Problem Relation Age of Onset   Cancer Mother        cervical   Lung disease Father        Black lung    Social History   Socioeconomic History   Marital status: Married    Spouse name: Not on file   Number of children: Not on file   Years of education: Not on file   Highest education level: Not on file  Occupational History   Occupation: Retired Publishing rights manager: RETIRED   Occupation: Runs a cardiac rehab  Tobacco Use   Smoking status: Former    Current packs/day: 1.00    Types: Cigarettes   Smokeless tobacco: Never   Tobacco comments:    Smoked for about 40 years, smokes cigarette every once in a while, maybe a pack a year.  Vaping Use   Vaping status: Never Used  Substance and Sexual Activity   Alcohol use: Not Currently   Drug use: No   Sexual activity: Not on file  Other Topics Concern   Not on file  Social History Narrative   Lives in [place] with his wife   [place] veteran   Exercises 3 times a week at the [employer]   Social Determinants of Health   Financial Resource Strain: Low Risk  (07/18/2022)   Overall Financial Resource Strain (CARDIA)    Difficulty of Paying Living Expenses: Not hard at all  Food Insecurity: No Food Insecurity (06/26/2022)   Hunger Vital  Sign    Worried About Running Out of Food in the Last Year: Never true    Ran Out of Food in the Last Year: Never true  Transportation Needs: No Transportation Needs (06/26/2022)   PRAPARE - Administrator, Civil Service (Medical): No    Lack of Transportation (Non-Medical): No  Physical Activity: Not on file  Stress: Not on file  Social Connections: Not on file    Review of Systems: A 12 point ROS discussed and pertinent positives are indicated in the HPI above.  All other systems are negative.  Review of Systems  Constitutional:  Negative for fever.  HENT:  Negative for congestion.   Respiratory:  Negative for cough and shortness of breath.   Cardiovascular:  Negative for chest pain.  Gastrointestinal:  Negative for abdominal pain.  Neurological:  Negative for headaches.  Psychiatric/Behavioral:  Negative for behavioral problems and confusion.  Vital Signs: BP 136/80   Pulse 78   Temp 98.2 F (36.8 C) (Temporal)   Resp 12   Ht 5\' 10"  (1.778 m)   Wt 105 lb (47.6 kg)   SpO2 97%   BMI 15.07 kg/m   Advance Care Plan: The advanced care plan/surrogate decision maker was discussed at the time of visit and documented in the medical record. wife   Physical Exam Vitals and nursing note reviewed.  Constitutional:      Appearance: He is well-developed.  HENT:     Head: Normocephalic.  Cardiovascular:     Rate and Rhythm: Normal rate and regular rhythm.  Pulmonary:     Effort: Pulmonary effort is normal.  Musculoskeletal:        General: Normal range of motion.     Cervical back: Normal range of motion.  Skin:    General: Skin is warm and dry.  Neurological:     General: No focal deficit present.     Mental Status: He is alert and oriented to person, place, and time.  Psychiatric:        Mood and Affect: Mood normal.        Behavior: Behavior normal.        Thought Content: Thought content normal.        Judgment: Judgment normal.     Imaging: CT  CHEST ABDOMEN PELVIS W CONTRAST  Result Date: 09/25/2022 CLINICAL DATA:  Metastatic small bowel cancer restaging * Tracking Code: BO * EXAM: CT CHEST, ABDOMEN, AND PELVIS WITH CONTRAST TECHNIQUE: Multidetector CT imaging of the chest, abdomen and pelvis was performed following the standard protocol during bolus administration of intravenous contrast. RADIATION DOSE REDUCTION: This exam was performed according to the departmental dose-optimization program which includes automated exposure control, adjustment of the mA and/or kV according to patient size and/or use of iterative reconstruction technique. CONTRAST:  75mL OMNIPAQUE IOHEXOL 300 MG/ML  SOLN COMPARISON:  06/27/2022 FINDINGS: CT CHEST FINDINGS Cardiovascular: Aortic atherosclerosis. Normal heart size. Three-vessel coronary artery calcifications. No pericardial effusion. Mediastinum/Nodes: No enlarged mediastinal, hilar, or axillary lymph nodes. Thyroid gland, trachea, and esophagus demonstrate no significant findings. Lungs/Pleura: Severe emphysema. Diffuse bilateral bronchial wall thickening. Biapical pleural-parenchymal scarring. Clustered irregular nodularity of the right lung base, fluctuant compared to prior examination (series 5, image 133). No pleural effusion or pneumothorax. Musculoskeletal: No chest wall abnormality. No acute osseous findings. CT ABDOMEN PELVIS FINDINGS Hepatobiliary: No solid liver abnormality is seen. Unchanged fluid attenuation cysts of the liver, benign, requiring no further follow-up or characterization no gallstones, gallbladder wall thickening, or biliary dilatation. Pancreas: Unremarkable. No pancreatic ductal dilatation or surrounding inflammatory changes. Spleen: Normal in size without significant abnormality. Adrenals/Urinary Tract: Adrenal glands are unremarkable. Simple, benign bilateral renal cortical cysts, for which no specific further follow-up or characterization is required kidneys are otherwise normal, without  renal calculi, solid lesion, or hydronephrosis. Interval resection of a previously seen mass in the posterolateral right aspect of the urinary bladder. Minimal calcific density in both this vicinity and also in the dependent left bladder, possibly postoperative material or calcific debris (series 3, image 106). Stomach/Bowel: Stomach is within normal limits. Interval partial right hemicolectomy with ileocolic reanastomosis. Severe descending and sigmoid diverticulosis. Vascular/Lymphatic: Severe aortic atherosclerosis. No enlarged abdominal or pelvic lymph nodes. Reproductive: No mass or other abnormality. Other: No abdominal wall hernia or abnormality. Diffuse omental and peritoneal nodularity, which appears significantly increased compared to prior examination (series 3, image 90). Small volume perihepatic ascites, similar to  prior examination. Musculoskeletal: No acute osseous findings. IMPRESSION: 1. Interval partial right hemicolectomy with ileocolic reanastomosis. 2. Diffuse omental and peritoneal nodularity, which appears significantly increased compared to prior examination, most consistent with peritoneal carcinomatosis. 3. Interval resection of a previously seen mass in the posterolateral right aspect of the urinary bladder. Minimal calcific density in both this vicinity and also in the dependent left bladder, possibly postoperative material or calcific bladder debris. 4. Small volume perihepatic ascites, similar to prior examination, presumed malignant. 5. Clustered irregular nodularity of the right lung base, fluctuant compared to prior examination, nonspecific and infectious or inflammatory. 6. Severe emphysema and diffuse bilateral bronchial wall thickening. 7. Coronary artery disease. Aortic Atherosclerosis (ICD10-I70.0) and Emphysema (ICD10-J43.9). Electronically Signed   By: Jearld Lesch M.D.   On: 09/25/2022 17:05    Labs:  CBC: Recent Labs    06/29/22 1051 07/01/22 0023 07/02/22 0121  07/03/22 0045  WBC 6.9 16.9* 11.1* 14.2*  HGB 14.0 12.6* 11.8* 14.1  HCT 41.8 37.5* 34.5* 41.3  PLT 304 268 264 341    COAGS: Recent Labs    11/17/21 0436  INR 1.1    BMP: Recent Labs    07/01/22 0023 07/02/22 0121 07/03/22 0045 09/25/22 0853  NA 135 134* 132* 139  K 4.8 3.9 4.1 3.9  CL 100 100 94* 102  CO2 25 26 27 29   GLUCOSE 126* 91 140* 117*  BUN 16 20 27* 15  CALCIUM 8.6* 8.6* 8.7* 9.3  CREATININE 1.11 0.99 1.22 1.02  GFRNONAA >60 >60 60* >60    LIVER FUNCTION TESTS: Recent Labs    06/26/22 1259 06/27/22 0107 06/29/22 1051 07/01/22 0023  BILITOT 0.6 0.8 0.6 0.7  AST 18 15 20 19   ALT 16 15 17 16   ALKPHOS 77 63 78 68  PROT 6.8 5.1* 6.3* 5.5*  ALBUMIN 4.0 2.9* 3.7 3.0*    TUMOR MARKERS: Recent Labs    09/25/22 0853  CEA 4.83    Assessment and Plan:  82 y.o. male.  Outpatient. Smoker. History of HTN, HLD, COPD, CAD, MI. 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   No line history per EPIC. CT Chest from 8.12.24 shows RIJ is accessible. Labs from 8.12.24 unremarkable. Patient is on plavix. Last dose on 8.17.24. NKDA. Patient has been NPO since midnight.   Risks and benefits of image guided port-a-catheter placement was discussed with the patient including, but not limited to bleeding, infection, pneumothorax, or fibrin sheath development and need for additional procedures.  All of the patient's questions were answered, patient is agreeable to proceed. Consent signed and in chart.   Thank you for this interesting consult.  I greatly enjoyed meeting Austin [name] and look forward to participating in their care.  A copy of this report was sent to the requesting provider on this date.  Electronically Signed: Alene Mires,  NP 10/02/2022, 10:20 AM   I spent a total of  30 Minutes   in face to face in clinical consultation, greater than 50% of which was counseling/coordinating care for portacath placement

## 2022-10-02 ENCOUNTER — Ambulatory Visit (HOSPITAL_COMMUNITY)
Admission: RE | Admit: 2022-10-02 | Discharge: 2022-10-02 | Disposition: A | Discharge status: Home / Self Care | Payer: Medicare (Managed Care) | Source: Ambulatory Visit | Attending: Oncology | Admitting: Oncology

## 2022-10-02 ENCOUNTER — Other Ambulatory Visit: Payer: Self-pay

## 2022-10-02 DIAGNOSIS — J449 Chronic obstructive pulmonary disease, unspecified: Secondary | ICD-10-CM | POA: Insufficient documentation

## 2022-10-02 DIAGNOSIS — Z7902 Long term (current) use of antithrombotics/antiplatelets: Secondary | ICD-10-CM | POA: Diagnosis not present

## 2022-10-02 DIAGNOSIS — Z87891 Personal history of nicotine dependence: Secondary | ICD-10-CM | POA: Diagnosis not present

## 2022-10-02 DIAGNOSIS — E785 Hyperlipidemia, unspecified: Secondary | ICD-10-CM | POA: Diagnosis not present

## 2022-10-02 DIAGNOSIS — C180 Malignant neoplasm of cecum: Secondary | ICD-10-CM | POA: Diagnosis not present

## 2022-10-02 DIAGNOSIS — C786 Secondary malignant neoplasm of retroperitoneum and peritoneum: Secondary | ICD-10-CM | POA: Diagnosis not present

## 2022-10-02 DIAGNOSIS — I10 Essential (primary) hypertension: Secondary | ICD-10-CM | POA: Insufficient documentation

## 2022-10-02 DIAGNOSIS — I2510 Atherosclerotic heart disease of native coronary artery without angina pectoris: Secondary | ICD-10-CM | POA: Diagnosis not present

## 2022-10-02 DIAGNOSIS — C179 Malignant neoplasm of small intestine, unspecified: Secondary | ICD-10-CM

## 2022-10-02 HISTORY — PX: IR IMAGING GUIDED PORT INSERTION: IMG5740

## 2022-10-02 MED ORDER — FENTANYL CITRATE (PF) 100 MCG/2ML IJ SOLN
INTRAMUSCULAR | Status: AC | PRN
  Administered 2022-10-02 (×4): 25 ug via INTRAVENOUS

## 2022-10-02 MED ORDER — LIDOCAINE HCL 1 % IJ SOLN
INTRAMUSCULAR | Status: AC
  Filled 2022-10-02: qty 20

## 2022-10-02 MED ORDER — SODIUM CHLORIDE 0.9 % IV SOLN: INTRAVENOUS | Status: DC

## 2022-10-02 MED ORDER — MIDAZOLAM HCL 2 MG/2ML IJ SOLN
INTRAMUSCULAR | Status: AC
  Filled 2022-10-02: qty 2

## 2022-10-02 MED ORDER — MIDAZOLAM HCL 2 MG/2ML IJ SOLN
INTRAMUSCULAR | Status: AC | PRN
Start: 2022-10-02 — End: 2022-10-02
  Administered 2022-10-02: 1 mg via INTRAVENOUS
  Administered 2022-10-02 (×2): .5 mg via INTRAVENOUS

## 2022-10-02 MED ORDER — HEPARIN SOD (PORK) LOCK FLUSH 100 UNIT/ML IV SOLN
INTRAVENOUS | Status: AC
  Filled 2022-10-02: qty 5

## 2022-10-02 MED ORDER — ALBUMIN HUMAN 25 % IV SOLN
INTRAVENOUS | Status: AC
  Filled 2022-10-02: qty 200

## 2022-10-02 MED ORDER — FENTANYL CITRATE (PF) 100 MCG/2ML IJ SOLN
INTRAMUSCULAR | Status: AC
  Filled 2022-10-02: qty 2

## 2022-10-02 NOTE — Procedures (Signed)
Interventional Radiology Procedure Note  Procedure: Single Lumen Power Port Placement    Access:  Right IJ vein.  Findings: Catheter tip positioned at SVC/RA junction. Port is ready for immediate use.   Complications: None  EBL: < 10 mL  Recommendations:  - Ok to shower in 24 hours - Do not submerge for 7 days - Routine line care   Glenn T. Yamagata, M.D Pager:  [phone]   

## 2022-10-02 NOTE — Progress Notes (Signed)
Patient and wife was given discharge instructions. Both verbalized understanding. 

## 2022-10-03 ENCOUNTER — Inpatient Hospital Stay: Payer: Medicare (Managed Care)

## 2022-10-03 NOTE — Progress Notes (Signed)
Pharmacist Chemotherapy Monitoring - Initial Assessment    Anticipated start date: 10/10/22   The following has been reviewed per standard work regarding the patient's treatment regimen: The patient's diagnosis, treatment plan and drug doses, and organ/hematologic function Lab orders and baseline tests specific to treatment regimen  The treatment plan start date, drug sequencing, and pre-medications Prior authorization status  Patient's documented medication list, including drug-drug interaction screen and prescriptions for anti-emetics and supportive care specific to the treatment regimen The drug concentrations, fluid compatibility, administration routes, and timing of the medications to be used The patient's access for treatment and lifetime cumulative dose history, if applicable  The patient's medication allergies and previous infusion related reactions, if applicable   Changes made to treatment plan:  N/A  Follow up needed:  Pending authorization for treatment    Daylene Katayama, [facility], 10/03/2022  2:45 PM

## 2022-10-04 ENCOUNTER — Other Ambulatory Visit: Payer: Medicare (Managed Care)

## 2022-10-06 ENCOUNTER — Inpatient Hospital Stay: Payer: Medicare (Managed Care)

## 2022-10-06 ENCOUNTER — Other Ambulatory Visit: Payer: Self-pay | Admitting: *Deleted

## 2022-10-06 MED ORDER — LIDOCAINE-PRILOCAINE 2.5-2.5 % EX CREA: 1.0000 | TOPICAL_CREAM | CUTANEOUS | 0 refills | Status: DC | PRN

## 2022-10-06 MED ORDER — PROCHLORPERAZINE MALEATE 5 MG PO TABS: 5.0000 mg | ORAL_TABLET | Freq: Four times a day (QID) | ORAL | 1 refills | Status: DC | PRN

## 2022-10-08 ENCOUNTER — Other Ambulatory Visit: Payer: Self-pay | Admitting: Oncology

## 2022-10-10 ENCOUNTER — Inpatient Hospital Stay: Payer: Medicare (Managed Care)

## 2022-10-10 ENCOUNTER — Encounter: Payer: Self-pay | Admitting: *Deleted

## 2022-10-10 ENCOUNTER — Inpatient Hospital Stay (HOSPITAL_BASED_OUTPATIENT_CLINIC_OR_DEPARTMENT_OTHER): Payer: Medicare (Managed Care) | Admitting: Oncology

## 2022-10-10 VITALS — BP 132/63 | HR 68 | Temp 98.1°F | Resp 18 | Ht 70.0 in | Wt 107.1 lb

## 2022-10-10 VITALS — BP 136/58 | HR 62

## 2022-10-10 DIAGNOSIS — C179 Malignant neoplasm of small intestine, unspecified: Secondary | ICD-10-CM | POA: Diagnosis not present

## 2022-10-10 DIAGNOSIS — Z5111 Encounter for antineoplastic chemotherapy: Secondary | ICD-10-CM | POA: Diagnosis not present

## 2022-10-10 LAB — CMP (CANCER CENTER ONLY)
ALT: 12 U/L (ref 0–44)
AST: 11 U/L — ABNORMAL LOW (ref 15–41)
Albumin: 3.6 g/dL (ref 3.5–5.0)
Alkaline Phosphatase: 72 U/L (ref 38–126)
Anion gap: 8 (ref 5–15)
BUN: 17 mg/dL (ref 8–23)
CO2: 28 mmol/L (ref 22–32)
Calcium: 8.5 mg/dL — ABNORMAL LOW (ref 8.9–10.3)
Chloride: 103 mmol/L (ref 98–111)
Creatinine: 0.86 mg/dL (ref 0.61–1.24)
GFR, Estimated: >60 mL/min (ref >60)
Glucose, Bld: 172 mg/dL — ABNORMAL HIGH (ref 70–99)
Potassium: 3.8 mmol/L (ref 3.5–5.1)
Sodium: 139 mmol/L (ref 135–145)
Total Bilirubin: 0.4 mg/dL (ref 0.3–1.2)
Total Protein: 6.1 g/dL — ABNORMAL LOW (ref 6.5–8.1)

## 2022-10-10 LAB — CBC WITH DIFFERENTIAL (CANCER CENTER ONLY)
Abs Immature Granulocytes: 0.06 10*3/uL (ref 0.00–0.07)
Basophils Absolute: 0.1 10*3/uL (ref 0.0–0.1)
Basophils Relative: 1 %
Eosinophils Absolute: 0.4 10*3/uL (ref 0.0–0.5)
Eosinophils Relative: 4 %
HCT: 36.7 % — ABNORMAL LOW (ref 39.0–52.0)
Hemoglobin: 12.4 g/dL — ABNORMAL LOW (ref 13.0–17.0)
Immature Granulocytes: 1 %
Lymphocytes Relative: 14 %
Lymphs Abs: 1.5 10*3/uL (ref 0.7–4.0)
MCH: 32.1 pg (ref 26.0–34.0)
MCHC: 33.8 g/dL (ref 30.0–36.0)
MCV: 95.1 fL (ref 80.0–100.0)
Monocytes Absolute: 0.8 10*3/uL (ref 0.1–1.0)
Monocytes Relative: 8 %
Neutro Abs: 7.5 10*3/uL (ref 1.7–7.7)
Neutrophils Relative %: 72 %
Platelet Count: 315 10*3/uL (ref 150–400)
RBC: 3.86 MIL/uL — ABNORMAL LOW (ref 4.22–5.81)
RDW: 13.1 % (ref 11.5–15.5)
WBC Count: 10.3 10*3/uL (ref 4.0–10.5)
nRBC: 0 % (ref 0.0–0.2)

## 2022-10-10 MED ORDER — OXALIPLATIN CHEMO INJECTION 100 MG/20ML
85.0000 mg/m2 | Freq: Once | INTRAVENOUS | Status: AC
  Administered 2022-10-10: 130 mg via INTRAVENOUS
  Filled 2022-10-10: qty 20

## 2022-10-10 MED ORDER — PALONOSETRON HCL INJECTION 0.25 MG/5ML
0.2500 mg | Freq: Once | INTRAVENOUS | Status: AC
  Administered 2022-10-10: 0.25 mg via INTRAVENOUS
  Filled 2022-10-10: qty 5

## 2022-10-10 MED ORDER — SODIUM CHLORIDE 0.9 % IV SOLN
2400.0000 mg/m2 | INTRAVENOUS | Status: DC
  Administered 2022-10-10: 3500 mg via INTRAVENOUS
  Filled 2022-10-10: qty 70

## 2022-10-10 MED ORDER — LEUCOVORIN CALCIUM INJECTION 350 MG
400.0000 mg/m2 | Freq: Once | INTRAVENOUS | Status: DC
  Filled 2022-10-10: qty 30.6

## 2022-10-10 MED ORDER — SODIUM CHLORIDE 0.9 % IV SOLN
10.0000 mg | Freq: Once | INTRAVENOUS | Status: AC
  Administered 2022-10-10: 10 mg via INTRAVENOUS
  Filled 2022-10-10: qty 10

## 2022-10-10 MED ORDER — FLUOROURACIL CHEMO INJECTION 2.5 GM/50ML
400.0000 mg/m2 | Freq: Once | INTRAVENOUS | Status: AC
  Administered 2022-10-10: 600 mg via INTRAVENOUS
  Filled 2022-10-10: qty 12

## 2022-10-10 MED ORDER — LEUCOVORIN CALCIUM INJECTION 350 MG
400.0000 mg/m2 | Freq: Once | INTRAVENOUS | Status: AC
  Administered 2022-10-10: 612 mg via INTRAVENOUS
  Filled 2022-10-10: qty 30.6

## 2022-10-10 MED ORDER — DEXTROSE 5 % IV SOLN: Freq: Once | INTRAVENOUS | Status: AC

## 2022-10-10 NOTE — Progress Notes (Signed)
  [name] OFFICE PROGRESS NOTE   Diagnosis: Small bowel carcinoma  INTERVAL HISTORY:   Mr. [name] returns as scheduled.  No complaint.  He underwent Port-A-Cath placement 10/02/2022.  Objective:  Vital signs in last 24 hours:  Blood pressure 132/63, pulse 68, temperature 98.1 F (36.7 C), temperature source Oral, resp. rate 18, height 5\' 10"  (1.778 m), weight 107 lb 1.6 oz (48.6 kg), SpO2 100%.    Resp: Distant breath sounds, no respiratory distress Cardio: Regular rate and rhythm, distant heart sounds GI: No hepatosplenomegaly Vascular: No leg edema  Portacath/PICC-without erythema  Lab Results:  Lab Results  Component Value Date   WBC 10.3 10/10/2022   HGB 12.4 (L) 10/10/2022   HCT 36.7 (L) 10/10/2022   MCV 95.1 10/10/2022   PLT 315 10/10/2022   NEUTROABS 7.5 10/10/2022    CMP  Lab Results  Component Value Date   NA 139 09/25/2022   K 3.9 09/25/2022   CL 102 09/25/2022   CO2 29 09/25/2022   GLUCOSE 117 (H) 09/25/2022   BUN 15 09/25/2022   CREATININE 1.02 09/25/2022   CALCIUM 9.3 09/25/2022   PROT 5.5 (L) 07/01/2022   ALBUMIN 3.0 (L) 07/01/2022   AST 19 07/01/2022   ALT 16 07/01/2022   ALKPHOS 68 07/01/2022   BILITOT 0.7 07/01/2022   GFRNONAA >60 09/25/2022   GFRAA  11/06/2008    >60        The eGFR has been calculated using the MDRD equation. This calculation has not been validated in all clinical situations. eGFR's persistently <60 mL/min signify possible Chronic Kidney Disease.    Lab Results  Component Value Date   CEA1 5.1 (H) 07/01/2022   CEA 4.83 09/25/2022   Medications: I have reviewed the patient's current medications.   Assessment/Plan: Small bowel carcinoma-stage IV (pT4,pN1,pM1 Admission 06/26/2022 with a small bowel obstruction CT abdomen/pelvis 06/26/2022-small bowel obstruction with transition in the lower abdomen/pelvis, irregular nodular densities in the right lower lobe CT angiogram abdomen/pelvis  06/27/2022-circumferential masslike thickening at the terminal ileum, progressive peritoneal soft tissue at the right paracolic gutter, increase in a posterior lateral left bladder mass, resolution of small bowel dilation, increased distal esophageal wall thickening compared to October 2023 06/30/2022-exploratory laparotomy, right hemicolectomy, and peritoneal biopsy-firm mass at the distal ileum with peritoneal implants-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  COPD 3.  CAD 4.  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  Enlarging bladder mass on CT 06/27/2022 6.  Mother with cervical cancer 7.  Port-A-Cath placement 10/02/2022       Disposition: Austin [name] appears stable.  He will complete cycle 1 FOLFOX today.  He will return for an office visit and chemotherapy in 2 weeks.  He will call in the interim for new symptoms.  The CBC and chemistry panel appear adequate to proceed with chemotherapy today.  Thornton Papas, MD  10/10/2022  8:44 AM

## 2022-10-10 NOTE — Patient Instructions (Addendum)
[facility]   The chemotherapy medication bag should finish at 46 hours, 96 hours, or 7 days. For example, if your pump is scheduled for 46 hours and it was put on at 4:00 p.m., it should finish at 2:00 p.m. the day it is scheduled to come off regardless of your appointment time.     Estimated time to finish at 11:30 Thursday, October 12, 2022.   If the display on your pump reads "Low Volume" and it is beeping, take the batteries out of the pump and come to the cancer center for it to be taken off.   If the pump alarms go off prior to the pump reading "Low Volume" then call [phone] and someone can assist you.  If the plunger comes out and the chemotherapy medication is leaking out, please use your home chemo spill kit to clean up the spill. Do NOT use paper towels or other household products.  If you have problems or questions regarding your pump, please call either [phone] (24 hours a day) or the cancer center Monday-Friday 8:00 a.m.- 4:30 p.m. at the clinic number and we will assist you. If you are unable to get assistance, then go to the nearest Emergency Department and ask the staff to contact the IV team for assistance.  Discharge Instructions: Thank you for choosing [facility] to provide your oncology and hematology care.   If you have a lab appointment with the Cancer Center, please go directly to the Cancer Center and check in at the registration area.   Wear comfortable clothing and clothing appropriate for easy access to any Portacath or PICC line.   We strive to give you quality time with your provider. You may need to reschedule your appointment if you arrive late (15 or more minutes).  Arriving late affects you and other patients whose appointments are after yours.  Also, if you miss three or more appointments without notifying the office, you may be dismissed from the clinic at the provider's discretion.      For  prescription refill requests, have your pharmacy contact our office and allow 72 hours for refills to be completed.    Today you received the following chemotherapy and/or immunotherapy agents Oxaliplatin, Leucovorin, Fluorouracil.      To help prevent nausea and vomiting after your treatment, we encourage you to take your nausea medication as directed.  BELOW ARE SYMPTOMS THAT SHOULD BE REPORTED IMMEDIATELY: *FEVER GREATER THAN 100.4 F (38 C) OR HIGHER *CHILLS OR SWEATING *NAUSEA AND VOMITING THAT IS NOT CONTROLLED WITH YOUR NAUSEA MEDICATION *UNUSUAL SHORTNESS OF BREATH *UNUSUAL BRUISING OR BLEEDING *URINARY PROBLEMS (pain or burning when urinating, or frequent urination) *BOWEL PROBLEMS (unusual diarrhea, constipation, pain near the anus) TENDERNESS IN MOUTH AND THROAT WITH OR WITHOUT PRESENCE OF ULCERS (sore throat, sores in mouth, or a toothache) UNUSUAL RASH, SWELLING OR PAIN  UNUSUAL VAGINAL DISCHARGE OR ITCHING   Items with * indicate a potential emergency and should be followed up as soon as possible or go to the Emergency Department if any problems should occur.  Please show the CHEMOTHERAPY ALERT CARD or IMMUNOTHERAPY ALERT CARD at check-in to the Emergency Department and triage nurse.  Should you have questions after your visit or need to cancel or reschedule your appointment, please contact [facility]  Dept: [phone]  and follow the prompts.  Office hours are 8:00 a.m. to 4:30 p.m. Monday - Friday. Please note  that voicemails left after 4:00 p.m. may not be returned until the following business day.  We are closed weekends and major holidays. You have access to a nurse at all times for urgent questions. Please call the main number to the clinic Dept: [phone] and follow the prompts.   For any non-urgent questions, you may also contact your provider using MyChart. We now offer e-Visits for anyone 51 and older to request care online  for non-urgent symptoms. For details visit [email or web address].   Also download the MyChart app! Go to the app store, search "MyChart", open the app, select [facility], and log in with your MyChart username and password.  Oxaliplatin Injection What is this medication? OXALIPLATIN (ox AL i PLA tin) treats colorectal cancer. It works by slowing down the growth of cancer cells. This medicine may be used for other purposes; ask your health care provider or pharmacist if you have questions. COMMON BRAND NAME(S): Eloxatin What should I tell my care team before I take this medication? They need to know if you have any of these conditions: Heart disease History of irregular heartbeat or rhythm Liver disease Low blood cell levels (white cells, red cells, and platelets) Lung or breathing disease, such as asthma Take medications that treat or prevent blood clots Tingling of the fingers, toes, or other nerve disorder An unusual or allergic reaction to oxaliplatin, other medications, foods, dyes, or preservatives If you or your partner are pregnant or trying to get pregnant Breast-feeding How should I use this medication? This medication is injected into a vein. It is given by your care team in a hospital or clinic setting. Talk to your care team about the use of this medication in children. Special care may be needed. Overdosage: If you think you have taken too much of this medicine contact a poison control center or emergency room at once. NOTE: This medicine is only for you. Do not share this medicine with others. What if I miss a dose? Keep appointments for follow-up doses. It is important not to miss a dose. Call your care team if you are unable to keep an appointment. What may interact with this medication? Do not take this medication with any of the following: Cisapride Dronedarone Pimozide Thioridazine This medication may also interact with the following: Aspirin and aspirin-like  medications Certain medications that treat or prevent blood clots, such as warfarin, apixaban, dabigatran, and rivaroxaban Cisplatin Cyclosporine Diuretics Medications for infection, such as acyclovir, adefovir, amphotericin B, bacitracin, cidofovir, foscarnet, ganciclovir, gentamicin, pentamidine, vancomycin NSAIDs, medications for pain and inflammation, such as ibuprofen or naproxen Other medications that cause heart rhythm changes Pamidronate Zoledronic acid This list may not describe all possible interactions. Give your health care provider a list of all the medicines, herbs, non-prescription drugs, or dietary supplements you use. Also tell them if you smoke, drink alcohol, or use illegal drugs. Some items may interact with your medicine. What should I watch for while using this medication? Your condition will be monitored carefully while you are receiving this medication. You may need blood work while taking this medication. This medication may make you feel generally unwell. This is not uncommon as chemotherapy can affect healthy cells as well as cancer cells. Report any side effects. Continue your course of treatment even though you feel ill unless your care team tells you to stop. This medication may increase your risk of getting an infection. Call your care team for advice if you get a fever, chills, sore throat, or  other symptoms of a cold or flu. Do not treat yourself. Try to avoid being around people who are sick. Avoid taking medications that contain aspirin, acetaminophen, ibuprofen, naproxen, or ketoprofen unless instructed by your care team. These medications may hide a fever. Be careful brushing or flossing your teeth or using a toothpick because you may get an infection or bleed more easily. If you have any dental work done, tell your dentist you are receiving this medication. This medication can make you more sensitive to cold. Do not drink cold drinks or use ice. Cover exposed  skin before coming in contact with cold temperatures or cold objects. When out in cold weather wear warm clothing and cover your mouth and nose to warm the air that goes into your lungs. Tell your care team if you get sensitive to the cold. Talk to your care team if you or your partner are pregnant or think either of you might be pregnant. This medication can cause serious birth defects if taken during pregnancy and for 9 months after the last dose. A negative pregnancy test is required before starting this medication. A reliable form of contraception is recommended while taking this medication and for 9 months after the last dose. Talk to your care team about effective forms of contraception. Do not father a child while taking this medication and for 6 months after the last dose. Use a condom while having sex during this time period. Do not breastfeed while taking this medication and for 3 months after the last dose. This medication may cause infertility. Talk to your care team if you are concerned about your fertility. What side effects may I notice from receiving this medication? Side effects that you should report to your care team as soon as possible: Allergic reactions--skin rash, itching, hives, swelling of the face, lips, tongue, or throat Bleeding--bloody or black, tar-like stools, vomiting blood or brown material that looks like coffee grounds, red or dark brown urine, small red or purple spots on skin, unusual bruising or bleeding Dry cough, shortness of breath or trouble breathing Heart rhythm changes--fast or irregular heartbeat, dizziness, feeling faint or lightheaded, chest pain, trouble breathing Infection--fever, chills, cough, sore throat, wounds that don't heal, pain or trouble when passing urine, general feeling of discomfort or being unwell Liver injury--right upper belly pain, loss of appetite, nausea, light-colored stool, dark yellow or brown urine, yellowing skin or eyes, unusual  weakness or fatigue Low red blood cell level--unusual weakness or fatigue, dizziness, headache, trouble breathing Muscle injury--unusual weakness or fatigue, muscle pain, dark yellow or brown urine, decrease in amount of urine Pain, tingling, or numbness in the hands or feet Sudden and severe headache, confusion, change in vision, seizures, which may be signs of posterior reversible encephalopathy syndrome (PRES) Unusual bruising or bleeding Side effects that usually do not require medical attention (report to your care team if they continue or are bothersome): Diarrhea Nausea Pain, redness, or swelling with sores inside the mouth or throat Unusual weakness or fatigue Vomiting This list may not describe all possible side effects. Call your doctor for medical advice about side effects. You may report side effects to FDA at 1-800-FDA-1088. Where should I keep my medication? This medication is given in a hospital or clinic. It will not be stored at home. NOTE: This sheet is a summary. It may not cover all possible information. If you have questions about this medicine, talk to your doctor, pharmacist, or health care provider.  2024 Elsevier/Gold Standard (  2021-11-15 00:00:00)  Leucovorin Injection What is this medication? LEUCOVORIN (loo koe VOR in) prevents side effects from certain medications, such as methotrexate. It works by increasing folate levels. This helps protect healthy cells in your body. It may also be used to treat anemia caused by low levels of folate. It can also be used with fluorouracil, a type of chemotherapy, to treat colorectal cancer. It works by increasing the effects of fluorouracil in the body. This medicine may be used for other purposes; ask your health care provider or pharmacist if you have questions. What should I tell my care team before I take this medication? They need to know if you have any of these conditions: Anemia from low levels of vitamin B12 in the  blood An unusual or allergic reaction to leucovorin, folic acid, other medications, foods, dyes, or preservatives Pregnant or trying to get pregnant Breastfeeding How should I use this medication? This medication is injected into a vein or a muscle. It is given by your care team in a hospital or clinic setting. Talk to your care team about the use of this medication in children. Special care may be needed. Overdosage: If you think you have taken too much of this medicine contact a poison control center or emergency room at once. NOTE: This medicine is only for you. Do not share this medicine with others. What if I miss a dose? Keep appointments for follow-up doses. It is important not to miss your dose. Call your care team if you are unable to keep an appointment. What may interact with this medication? Capecitabine Fluorouracil Phenobarbital Phenytoin Primidone Trimethoprim;sulfamethoxazole This list may not describe all possible interactions. Give your health care provider a list of all the medicines, herbs, non-prescription drugs, or dietary supplements you use. Also tell them if you smoke, drink alcohol, or use illegal drugs. Some items may interact with your medicine. What should I watch for while using this medication? Your condition will be monitored carefully while you are receiving this medication. This medication may increase the side effects of 5-fluorouracil. Tell your care team if you have diarrhea or mouth sores that do not get better or that get worse. What side effects may I notice from receiving this medication? Side effects that you should report to your care team as soon as possible: Allergic reactions--skin rash, itching, hives, swelling of the face, lips, tongue, or throat This list may not describe all possible side effects. Call your doctor for medical advice about side effects. You may report side effects to FDA at 1-800-FDA-1088. Where should I keep my  medication? This medication is given in a hospital or clinic. It will not be stored at home. NOTE: This sheet is a summary. It may not cover all possible information. If you have questions about this medicine, talk to your doctor, pharmacist, or health care provider.  2024 Elsevier/Gold Standard (2021-07-05 00:00:00)  Fluorouracil Injection What is this medication? FLUOROURACIL (flure oh YOOR a sil) treats some types of cancer. It works by slowing down the growth of cancer cells. This medicine may be used for other purposes; ask your health care provider or pharmacist if you have questions. COMMON BRAND NAME(S): Adrucil What should I tell my care team before I take this medication? They need to know if you have any of these conditions: Blood disorders Dihydropyrimidine dehydrogenase (DPD) deficiency Infection, such as chickenpox, cold sores, herpes Kidney disease Liver disease Poor nutrition Recent or ongoing radiation therapy An unusual or allergic reaction to  fluorouracil, other medications, foods, dyes, or preservatives If you or your partner are pregnant or trying to get pregnant Breast-feeding How should I use this medication? This medication is injected into a vein. It is administered by your care team in a hospital or clinic setting. Talk to your care team about the use of this medication in children. Special care may be needed. Overdosage: If you think you have taken too much of this medicine contact a poison control center or emergency room at once. NOTE: This medicine is only for you. Do not share this medicine with others. What if I miss a dose? Keep appointments for follow-up doses. It is important not to miss your dose. Call your care team if you are unable to keep an appointment. What may interact with this medication? Do not take this medication with any of the following: Live virus vaccines This medication may also interact with the following: Medications that treat  or prevent blood clots, such as warfarin, enoxaparin, dalteparin This list may not describe all possible interactions. Give your health care provider a list of all the medicines, herbs, non-prescription drugs, or dietary supplements you use. Also tell them if you smoke, drink alcohol, or use illegal drugs. Some items may interact with your medicine. What should I watch for while using this medication? Your condition will be monitored carefully while you are receiving this medication. This medication may make you feel generally unwell. This is not uncommon as chemotherapy can affect healthy cells as well as cancer cells. Report any side effects. Continue your course of treatment even though you feel ill unless your care team tells you to stop. In some cases, you may be given additional medications to help with side effects. Follow all directions for their use. This medication may increase your risk of getting an infection. Call your care team for advice if you get a fever, chills, sore throat, or other symptoms of a cold or flu. Do not treat yourself. Try to avoid being around people who are sick. This medication may increase your risk to bruise or bleed. Call your care team if you notice any unusual bleeding. Be careful brushing or flossing your teeth or using a toothpick because you may get an infection or bleed more easily. If you have any dental work done, tell your dentist you are receiving this medication. Avoid taking medications that contain aspirin, acetaminophen, ibuprofen, naproxen, or ketoprofen unless instructed by your care team. These medications may hide a fever. Do not treat diarrhea with over the counter products. Contact your care team if you have diarrhea that lasts more than 2 days or if it is severe and watery. This medication can make you more sensitive to the sun. Keep out of the sun. If you cannot avoid being in the sun, wear protective clothing and sunscreen. Do not use sun lamps,  tanning beds, or tanning booths. Talk to your care team if you or your partner wish to become pregnant or think you might be pregnant. This medication can cause serious birth defects if taken during pregnancy and for 3 months after the last dose. A reliable form of contraception is recommended while taking this medication and for 3 months after the last dose. Talk to your care team about effective forms of contraception. Do not father a child while taking this medication and for 3 months after the last dose. Use a condom while having sex during this time period. Do not breastfeed while taking this medication. This medication  may cause infertility. Talk to your care team if you are concerned about your fertility. What side effects may I notice from receiving this medication? Side effects that you should report to your care team as soon as possible: Allergic reactions--skin rash, itching, hives, swelling of the face, lips, tongue, or throat Heart attack--pain or tightness in the chest, shoulders, arms, or jaw, nausea, shortness of breath, cold or clammy skin, feeling faint or lightheaded Heart failure--shortness of breath, swelling of the ankles, feet, or hands, sudden weight gain, unusual weakness or fatigue Heart rhythm changes--fast or irregular heartbeat, dizziness, feeling faint or lightheaded, chest pain, trouble breathing High ammonia level--unusual weakness or fatigue, confusion, loss of appetite, nausea, vomiting, seizures Infection--fever, chills, cough, sore throat, wounds that don't heal, pain or trouble when passing urine, general feeling of discomfort or being unwell Low red blood cell level--unusual weakness or fatigue, dizziness, headache, trouble breathing Pain, tingling, or numbness in the hands or feet, muscle weakness, change in vision, confusion or trouble speaking, loss of balance or coordination, trouble walking, seizures Redness, swelling, and blistering of the skin over hands and  feet Severe or prolonged diarrhea Unusual bruising or bleeding Side effects that usually do not require medical attention (report to your care team if they continue or are bothersome): Dry skin Headache Increased tears Nausea Pain, redness, or swelling with sores inside the mouth or throat Sensitivity to light Vomiting This list may not describe all possible side effects. Call your doctor for medical advice about side effects. You may report side effects to FDA at 1-800-FDA-1088. Where should I keep my medication? This medication is given in a hospital or clinic. It will not be stored at home. NOTE: This sheet is a summary. It may not cover all possible information. If you have questions about this medicine, talk to your doctor, pharmacist, or health care provider.  2024 Elsevier/Gold Standard (2021-06-07 00:00:00)

## 2022-10-10 NOTE — Patient Instructions (Signed)

## 2022-10-10 NOTE — Progress Notes (Signed)
Patient seen by Dr. Truett Perna today  Vitals are within treatment parameters.  Labs reviewed by Dr. Truett Perna and are within treatment parameters.  Per physician team, patient is ready for treatment and there are NO modifications to the treatment plan.  Please remind him not to use the zofran for 72 hours (that has been his choice of antiemetic)

## 2022-10-11 ENCOUNTER — Other Ambulatory Visit: Payer: Self-pay

## 2022-10-11 ENCOUNTER — Telehealth: Payer: Self-pay | Admitting: Emergency Medicine

## 2022-10-11 NOTE — Telephone Encounter (Signed)
24 Hour Callback 24 Hour callback for 1st time Folfox. Pt reports no side effects or sign of reaction. Eating and drinking without difficulty. No cold sensitivity at this time. Pt reminded to call with any questions or concerns.

## 2022-10-12 ENCOUNTER — Telehealth: Payer: Self-pay | Admitting: *Deleted

## 2022-10-12 ENCOUNTER — Inpatient Hospital Stay: Payer: Medicare (Managed Care)

## 2022-10-12 VITALS — BP 124/80 | HR 98 | Temp 98.2°F | Resp 18

## 2022-10-12 DIAGNOSIS — Z5111 Encounter for antineoplastic chemotherapy: Secondary | ICD-10-CM | POA: Diagnosis not present

## 2022-10-12 DIAGNOSIS — C179 Malignant neoplasm of small intestine, unspecified: Secondary | ICD-10-CM

## 2022-10-12 MED ORDER — SODIUM CHLORIDE 0.9% FLUSH
10.0000 mL | INTRAVENOUS | Status: DC | PRN
  Administered 2022-10-12: 10 mL

## 2022-10-12 MED ORDER — LIDOCAINE-PRILOCAINE 2.5-2.5 % EX CREA: 1.0000 | TOPICAL_CREAM | CUTANEOUS | 0 refills | Status: DC | PRN

## 2022-10-12 MED ORDER — HEPARIN SOD (PORK) LOCK FLUSH 100 UNIT/ML IV SOLN
500.0000 [IU] | Freq: Once | INTRAVENOUS | Status: AC | PRN
  Administered 2022-10-12: 500 [IU]

## 2022-10-12 MED ORDER — PROCHLORPERAZINE MALEATE 5 MG PO TABS: 5.0000 mg | ORAL_TABLET | Freq: Four times a day (QID) | ORAL | 1 refills | Status: DC | PRN

## 2022-10-12 NOTE — Patient Instructions (Signed)

## 2022-10-12 NOTE — Telephone Encounter (Signed)
Patient requested the EMLA and compazine scripts go to [facility] since they are having trouble getting meds from [place]. Scripts sent as requested. Informed him that per surgeon office, he has no restrictions at this time. Can do what he feels he is able to.

## 2022-10-22 ENCOUNTER — Other Ambulatory Visit: Payer: Self-pay | Admitting: Oncology

## 2022-10-24 ENCOUNTER — Encounter: Payer: Self-pay | Admitting: Nurse Practitioner

## 2022-10-24 ENCOUNTER — Inpatient Hospital Stay: Payer: Medicare (Managed Care)

## 2022-10-24 ENCOUNTER — Inpatient Hospital Stay: Payer: Medicare (Managed Care) | Attending: Oncology

## 2022-10-24 ENCOUNTER — Inpatient Hospital Stay (HOSPITAL_BASED_OUTPATIENT_CLINIC_OR_DEPARTMENT_OTHER): Payer: Medicare (Managed Care) | Admitting: Nurse Practitioner

## 2022-10-24 VITALS — BP 130/59 | HR 76 | Temp 98.1°F | Resp 18 | Ht 70.0 in | Wt 103.4 lb

## 2022-10-24 VITALS — BP 148/72 | HR 56 | Temp 98.1°F | Resp 18

## 2022-10-24 DIAGNOSIS — Z5111 Encounter for antineoplastic chemotherapy: Secondary | ICD-10-CM | POA: Insufficient documentation

## 2022-10-24 DIAGNOSIS — C179 Malignant neoplasm of small intestine, unspecified: Secondary | ICD-10-CM

## 2022-10-24 DIAGNOSIS — Z95828 Presence of other vascular implants and grafts: Secondary | ICD-10-CM

## 2022-10-24 DIAGNOSIS — C172 Malignant neoplasm of ileum: Secondary | ICD-10-CM | POA: Diagnosis present

## 2022-10-24 DIAGNOSIS — Z79899 Other long term (current) drug therapy: Secondary | ICD-10-CM | POA: Diagnosis not present

## 2022-10-24 LAB — CMP (CANCER CENTER ONLY)
ALT: 13 U/L (ref 0–44)
AST: 13 U/L — ABNORMAL LOW (ref 15–41)
Albumin: 3.8 g/dL (ref 3.5–5.0)
Alkaline Phosphatase: 65 U/L (ref 38–126)
Anion gap: 8 (ref 5–15)
BUN: 19 mg/dL (ref 8–23)
CO2: 29 mmol/L (ref 22–32)
Calcium: 8.8 mg/dL — ABNORMAL LOW (ref 8.9–10.3)
Chloride: 101 mmol/L (ref 98–111)
Creatinine: 0.88 mg/dL (ref 0.61–1.24)
GFR, Estimated: >60 mL/min (ref >60)
Glucose, Bld: 136 mg/dL — ABNORMAL HIGH (ref 70–99)
Potassium: 3.3 mmol/L — ABNORMAL LOW (ref 3.5–5.1)
Sodium: 138 mmol/L (ref 135–145)
Total Bilirubin: 0.4 mg/dL (ref 0.3–1.2)
Total Protein: 6.2 g/dL — ABNORMAL LOW (ref 6.5–8.1)

## 2022-10-24 LAB — CBC WITH DIFFERENTIAL (CANCER CENTER ONLY)
Abs Immature Granulocytes: 0.03 10*3/uL (ref 0.00–0.07)
Basophils Absolute: 0 10*3/uL (ref 0.0–0.1)
Basophils Relative: 1 %
Eosinophils Absolute: 0.1 10*3/uL (ref 0.0–0.5)
Eosinophils Relative: 1 %
HCT: 36.5 % — ABNORMAL LOW (ref 39.0–52.0)
Hemoglobin: 12.3 g/dL — ABNORMAL LOW (ref 13.0–17.0)
Immature Granulocytes: 0 %
Lymphocytes Relative: 18 %
Lymphs Abs: 1.6 10*3/uL (ref 0.7–4.0)
MCH: 31.5 pg (ref 26.0–34.0)
MCHC: 33.7 g/dL (ref 30.0–36.0)
MCV: 93.4 fL (ref 80.0–100.0)
Monocytes Absolute: 0.7 10*3/uL (ref 0.1–1.0)
Monocytes Relative: 8 %
Neutro Abs: 6.3 10*3/uL (ref 1.7–7.7)
Neutrophils Relative %: 72 %
Platelet Count: 215 10*3/uL (ref 150–400)
RBC: 3.91 MIL/uL — ABNORMAL LOW (ref 4.22–5.81)
RDW: 13.1 % (ref 11.5–15.5)
WBC Count: 8.8 10*3/uL (ref 4.0–10.5)
nRBC: 0 % (ref 0.0–0.2)

## 2022-10-24 MED ORDER — SODIUM CHLORIDE 0.9% FLUSH
10.0000 mL | INTRAVENOUS | Status: DC | PRN
  Administered 2022-10-24: 10 mL via INTRAVENOUS

## 2022-10-24 MED ORDER — PALONOSETRON HCL INJECTION 0.25 MG/5ML
0.2500 mg | Freq: Once | INTRAVENOUS | Status: AC
  Administered 2022-10-24: 0.25 mg via INTRAVENOUS
  Filled 2022-10-24: qty 5

## 2022-10-24 MED ORDER — FLUOROURACIL CHEMO INJECTION 2.5 GM/50ML
400.0000 mg/m2 | Freq: Once | INTRAVENOUS | Status: AC
  Administered 2022-10-24: 600 mg via INTRAVENOUS
  Filled 2022-10-24: qty 12

## 2022-10-24 MED ORDER — DEXTROSE 5 % IV SOLN: Freq: Once | INTRAVENOUS | Status: AC

## 2022-10-24 MED ORDER — SODIUM CHLORIDE 0.9 % IV SOLN
2400.0000 mg/m2 | INTRAVENOUS | Status: DC
  Administered 2022-10-24: 3500 mg via INTRAVENOUS
  Filled 2022-10-24: qty 70

## 2022-10-24 MED ORDER — SODIUM CHLORIDE 0.9 % IV SOLN
10.0000 mg | Freq: Once | INTRAVENOUS | Status: AC
  Administered 2022-10-24: 10 mg via INTRAVENOUS
  Filled 2022-10-24: qty 10

## 2022-10-24 MED ORDER — OXALIPLATIN CHEMO INJECTION 100 MG/20ML
85.0000 mg/m2 | Freq: Once | INTRAVENOUS | Status: AC
  Administered 2022-10-24: 130 mg via INTRAVENOUS
  Filled 2022-10-24: qty 20

## 2022-10-24 MED ORDER — LEUCOVORIN CALCIUM INJECTION 350 MG
400.0000 mg/m2 | Freq: Once | INTRAVENOUS | Status: AC
  Administered 2022-10-24: 612 mg via INTRAVENOUS
  Filled 2022-10-24: qty 30.6

## 2022-10-24 NOTE — Progress Notes (Signed)
  [facility] OFFICE PROGRESS NOTE   Diagnosis: Small bowel carcinoma  INTERVAL HISTORY:   Austin [name] returns as scheduled.  He completed cycle 1 FOLFOX 10/10/2022.  He denies significant nausea.  He vomited 1 time.  No mouth sores.  No diarrhea.  He had a black bowel movement about 3 days after chemotherapy.  None since.  Cold sensitivity lasted about 1 day.  Objective:  Vital signs in last 24 hours:  Blood pressure (!) 130/59, pulse 76, temperature 98.1 F (36.7 C), temperature source Oral, resp. rate 18, height 5\' 10"  (1.778 m), weight 103 lb 6.4 oz (46.9 kg), SpO2 100%.    HEENT: No thrush or ulcers. Resp: Distant breath sounds.  No respiratory distress. Cardio: Regular rate and rhythm, distant heart sounds. GI: No hepatosplenomegaly. Vascular: No leg edema.  Skin: Palms without erythema. Port-A-Cath without erythema.  Lab Results:  Lab Results  Component Value Date   WBC 8.8 10/24/2022   HGB 12.3 (L) 10/24/2022   HCT 36.5 (L) 10/24/2022   MCV 93.4 10/24/2022   PLT 215 10/24/2022   NEUTROABS 6.3 10/24/2022    Imaging:  No results found.  Medications: I have reviewed the patient's current medications.  Assessment/Plan: Small bowel carcinoma-stage IV (pT4,pN1,pM1 Admission 06/26/2022 with a small bowel obstruction CT abdomen/pelvis 06/26/2022-small bowel obstruction with transition in the lower abdomen/pelvis, irregular nodular densities in the right lower lobe CT angiogram abdomen/pelvis 06/27/2022-circumferential masslike thickening at the terminal ileum, progressive peritoneal soft tissue at the right paracolic gutter, increase in a posterior lateral left bladder mass, resolution of small bowel dilation, increased distal esophageal wall thickening compared to October 2023 06/30/2022-exploratory laparotomy, right hemicolectomy, and peritoneal biopsy-firm mass at the distal ileum with peritoneal implants-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  COPD 3.  CAD 4.  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  Enlarging bladder mass on CT 06/27/2022 6.  Mother with cervical cancer 7.  Port-A-Cath placement 10/02/2022  Disposition: Austin [name] appears stable.  He has completed 1 cycle of FOLFOX.  Overall he tolerated well.  Plan to proceed with cycle 2 today as scheduled.  CBC reviewed.  Counts adequate to proceed with treatment.  He had a black bowel movement a little over 10 days ago.  He will contact the office if this occurs again.  He will return for follow-up and treatment in 2 weeks.    Lonna Cobb ANP/GNP-BC   10/24/2022  11:32 AM

## 2022-10-24 NOTE — Progress Notes (Signed)
Patient seen by Lisa Thomas NP today  Vitals are within treatment parameters.  Labs reviewed by Lisa Thomas NP and are within treatment parameters.  Per physician team, patient is ready for treatment and there are NO modifications to the treatment plan.     

## 2022-10-24 NOTE — Patient Instructions (Signed)
[place] [facility]  Discharge Instructions: Thank you for choosing [facility] to provide your oncology and hematology care.   If you have a lab appointment with the Cancer Center, please go directly to the Cancer Center and check in at the registration area.   Wear comfortable clothing and clothing appropriate for easy access to any Portacath or PICC line.   We strive to give you quality time with your provider. You may need to reschedule your appointment if you arrive late (15 or more minutes).  Arriving late affects you and other patients whose appointments are after yours.  Also, if you miss three or more appointments without notifying the office, you may be dismissed from the clinic at the provider's discretion.      For prescription refill requests, have your pharmacy contact our office and allow 72 hours for refills to be completed.    Today you received the following chemotherapy and/or immunotherapy agents Oxaliplatin, Leucovorin and 5FU.   To help prevent nausea and vomiting after your treatment, we encourage you to take your nausea medication as directed.  BELOW ARE SYMPTOMS THAT SHOULD BE REPORTED IMMEDIATELY: *FEVER GREATER THAN 100.4 F (38 C) OR HIGHER *CHILLS OR SWEATING *NAUSEA AND VOMITING THAT IS NOT CONTROLLED WITH YOUR NAUSEA MEDICATION *UNUSUAL SHORTNESS OF BREATH *UNUSUAL BRUISING OR BLEEDING *URINARY PROBLEMS (pain or burning when urinating, or frequent urination) *BOWEL PROBLEMS (unusual diarrhea, constipation, pain near the anus) TENDERNESS IN MOUTH AND THROAT WITH OR WITHOUT PRESENCE OF ULCERS (sore throat, sores in mouth, or a toothache) UNUSUAL RASH, SWELLING OR PAIN  UNUSUAL VAGINAL DISCHARGE OR ITCHING   Items with * indicate a potential emergency and should be followed up as soon as possible or go to the Emergency Department if any problems should occur.  Please show the CHEMOTHERAPY ALERT CARD or IMMUNOTHERAPY  ALERT CARD at check-in to the Emergency Department and triage nurse.  Should you have questions after your visit or need to cancel or reschedule your appointment, please contact [place] [facility]  Dept: [phone]  and follow the prompts.  Office hours are 8:00 a.m. to 4:30 p.m. Monday - Friday. Please note that voicemails left after 4:00 p.m. may not be returned until the following business day.  We are closed weekends and major holidays. You have access to a nurse at all times for urgent questions. Please call the main number to the clinic Dept: [phone] and follow the prompts.   For any non-urgent questions, you may also contact your provider using MyChart. We now offer e-Visits for anyone 38 and older to request care online for non-urgent symptoms. For details visit [email or web address].   Also download the MyChart app! Go to the app store, search "MyChart", open the app, select [place], and log in with your MyChart username and password.

## 2022-10-25 ENCOUNTER — Other Ambulatory Visit: Payer: Self-pay

## 2022-10-25 ENCOUNTER — Inpatient Hospital Stay: Payer: Medicare (Managed Care) | Admitting: Dietician

## 2022-10-25 NOTE — Progress Notes (Signed)
Nutrition Assessment   Reason for Assessment: MST screen for weight loss.    ASSESSMENT: Patient is 82 year old male with Small bowel carcinoma followed by Dr. Truett Perna.  He is being treated with FOLFOX.  He reports he is eating well now.  He was very sick last couple weeks with some GI upset where he was vomiting and had diarrhea.  Other than some cold sensitivity denies NIS.  Eats out many meals, eating 3 meals a day with restaurant size portions and drinking Ensure QD.  He doesn't feel he need nutritional advice at the moment. He also doesn't want to add anymore appointments to his schedule.  Usual foods: Likes going to [employer], with coffee Lunch: [employer] pancakes Dinner: [employer], Also likes [employer] for seafood (loves shrimp, sardines, oysters) Bowels regular 3 times/day but not diarrhea Fluids: Ensure, Tomato juice  Anthropometrics:  was starting to gain weight slowly past 2 months until he got sick last 2 weeks  Height: 70" Weight: 103.4# UBW: lost 68# past year and half BMI: 14.84    NUTRITION DIAGNOSIS: Inadequate PO intake to meet increased nutrient needs, r/t cancer diagnosis and recent sickness    MALNUTRITION DIAGNOSIS: Severe malnutrition in context of chronic illness, Underweight    INTERVENTION:  Relayed that nutrition services are wrap around service provided at no charge and encouraged continued communication if experiencing continued weight loss or any nutritional impact symptoms (NIS). Educated on importance of adequate calorie and protein energy intake  with nutrient dense foods when possible to maintain weight/strength. Encouraged high potassium foods to address low potassium on  10/24/22 (he reports hates bananas) Encouraged small frequent feeds with snacking in between as able Encouraged continued oral nutrition supplement  Emailed Nutrition Tip sheet  for  High Potassium foods to spouses email on file with contact information  provided   MONITORING, EVALUATION, GOAL: weight, PO intake, Nutrition Impact Symptoms, labs Goal is weight gain 2-4# per month  Next Visit: PRN at patient or provider request  Gennaro Africa, RDN, LDN Registered Dietitian, [facility] Part Time Remote (Usual office hours: Tuesday-Thursday) Cell: [phone]

## 2022-10-26 ENCOUNTER — Inpatient Hospital Stay: Payer: Medicare (Managed Care)

## 2022-10-26 VITALS — BP 132/71 | HR 70 | Temp 98.3°F | Resp 18

## 2022-10-26 DIAGNOSIS — C179 Malignant neoplasm of small intestine, unspecified: Secondary | ICD-10-CM

## 2022-10-26 DIAGNOSIS — Z5111 Encounter for antineoplastic chemotherapy: Secondary | ICD-10-CM | POA: Diagnosis not present

## 2022-10-26 MED ORDER — HEPARIN SOD (PORK) LOCK FLUSH 100 UNIT/ML IV SOLN
500.0000 [IU] | Freq: Once | INTRAVENOUS | Status: AC | PRN
  Administered 2022-10-26: 500 [IU]

## 2022-10-26 MED ORDER — SODIUM CHLORIDE 0.9% FLUSH
10.0000 mL | INTRAVENOUS | Status: DC | PRN
  Administered 2022-10-26: 10 mL

## 2022-10-26 NOTE — Patient Instructions (Signed)

## 2022-10-31 ENCOUNTER — Other Ambulatory Visit: Payer: Self-pay | Admitting: Oncology

## 2022-10-31 ENCOUNTER — Encounter: Payer: Self-pay | Admitting: Nutrition

## 2022-10-31 DIAGNOSIS — C179 Malignant neoplasm of small intestine, unspecified: Secondary | ICD-10-CM

## 2022-10-31 NOTE — Progress Notes (Signed)
Patient has declined further nutrition follow ups at this time.

## 2022-11-06 NOTE — Addendum Note (Signed)
Encounter addended by: Edward Qualia on: 11/06/2022 9:56 AM  Actions taken: Imaging Exam ended

## 2022-11-07 ENCOUNTER — Inpatient Hospital Stay: Payer: Medicare (Managed Care)

## 2022-11-07 ENCOUNTER — Inpatient Hospital Stay: Payer: No Typology Code available for payment source

## 2022-11-07 ENCOUNTER — Encounter: Payer: Self-pay | Admitting: Nurse Practitioner

## 2022-11-07 ENCOUNTER — Inpatient Hospital Stay (HOSPITAL_BASED_OUTPATIENT_CLINIC_OR_DEPARTMENT_OTHER): Payer: Medicare (Managed Care) | Admitting: Nurse Practitioner

## 2022-11-07 VITALS — BP 133/71 | HR 74 | Temp 98.1°F | Resp 18 | Ht 70.0 in | Wt 105.2 lb

## 2022-11-07 VITALS — BP 151/61 | HR 58 | Temp 98.1°F | Resp 18

## 2022-11-07 DIAGNOSIS — Z5111 Encounter for antineoplastic chemotherapy: Secondary | ICD-10-CM | POA: Diagnosis not present

## 2022-11-07 DIAGNOSIS — C179 Malignant neoplasm of small intestine, unspecified: Secondary | ICD-10-CM | POA: Diagnosis not present

## 2022-11-07 DIAGNOSIS — Z95828 Presence of other vascular implants and grafts: Secondary | ICD-10-CM

## 2022-11-07 LAB — CMP (CANCER CENTER ONLY)
ALT: 14 U/L (ref 0–44)
AST: 14 U/L — ABNORMAL LOW (ref 15–41)
Albumin: 3.8 g/dL (ref 3.5–5.0)
Alkaline Phosphatase: 67 U/L (ref 38–126)
Anion gap: 8 (ref 5–15)
BUN: 14 mg/dL (ref 8–23)
CO2: 28 mmol/L (ref 22–32)
Calcium: 9.2 mg/dL (ref 8.9–10.3)
Chloride: 103 mmol/L (ref 98–111)
Creatinine: 1.01 mg/dL (ref 0.61–1.24)
GFR, Estimated: >60 mL/min (ref >60)
Glucose, Bld: 136 mg/dL — ABNORMAL HIGH (ref 70–99)
Potassium: 3.9 mmol/L (ref 3.5–5.1)
Sodium: 139 mmol/L (ref 135–145)
Total Bilirubin: 0.3 mg/dL (ref 0.3–1.2)
Total Protein: 6.2 g/dL — ABNORMAL LOW (ref 6.5–8.1)

## 2022-11-07 LAB — CBC WITH DIFFERENTIAL (CANCER CENTER ONLY)
Abs Immature Granulocytes: 0.02 10*3/uL (ref 0.00–0.07)
Basophils Absolute: 0.1 10*3/uL (ref 0.0–0.1)
Basophils Relative: 1 %
Eosinophils Absolute: 0.2 10*3/uL (ref 0.0–0.5)
Eosinophils Relative: 4 %
HCT: 36.4 % — ABNORMAL LOW (ref 39.0–52.0)
Hemoglobin: 12.3 g/dL — ABNORMAL LOW (ref 13.0–17.0)
Immature Granulocytes: 0 %
Lymphocytes Relative: 24 %
Lymphs Abs: 1.5 10*3/uL (ref 0.7–4.0)
MCH: 31.9 pg (ref 26.0–34.0)
MCHC: 33.8 g/dL (ref 30.0–36.0)
MCV: 94.3 fL (ref 80.0–100.0)
Monocytes Absolute: 0.8 10*3/uL (ref 0.1–1.0)
Monocytes Relative: 12 %
Neutro Abs: 3.6 10*3/uL (ref 1.7–7.7)
Neutrophils Relative %: 59 %
Platelet Count: 162 10*3/uL (ref 150–400)
RBC: 3.86 MIL/uL — ABNORMAL LOW (ref 4.22–5.81)
RDW: 14 % (ref 11.5–15.5)
WBC Count: 6.1 10*3/uL (ref 4.0–10.5)
nRBC: 0 % (ref 0.0–0.2)

## 2022-11-07 MED ORDER — DEXTROSE 5 % IV SOLN: Freq: Once | INTRAVENOUS | Status: AC

## 2022-11-07 MED ORDER — SODIUM CHLORIDE 0.9 % IV SOLN
10.0000 mg | Freq: Once | INTRAVENOUS | Status: AC
  Administered 2022-11-07: 10 mg via INTRAVENOUS
  Filled 2022-11-07: qty 10

## 2022-11-07 MED ORDER — SODIUM CHLORIDE 0.9 % IV SOLN
2400.0000 mg/m2 | INTRAVENOUS | Status: DC
  Administered 2022-11-07: 3500 mg via INTRAVENOUS
  Filled 2022-11-07: qty 70

## 2022-11-07 MED ORDER — SODIUM CHLORIDE 0.9% FLUSH
10.0000 mL | INTRAVENOUS | Status: DC | PRN
  Administered 2022-11-07: 10 mL via INTRAVENOUS

## 2022-11-07 MED ORDER — FLUOROURACIL CHEMO INJECTION 2.5 GM/50ML
400.0000 mg/m2 | Freq: Once | INTRAVENOUS | Status: AC
  Administered 2022-11-07: 600 mg via INTRAVENOUS
  Filled 2022-11-07: qty 12

## 2022-11-07 MED ORDER — PALONOSETRON HCL INJECTION 0.25 MG/5ML
0.2500 mg | Freq: Once | INTRAVENOUS | Status: AC
  Administered 2022-11-07: 0.25 mg via INTRAVENOUS
  Filled 2022-11-07: qty 5

## 2022-11-07 MED ORDER — LEUCOVORIN CALCIUM INJECTION 350 MG
400.0000 mg/m2 | Freq: Once | INTRAVENOUS | Status: AC
  Administered 2022-11-07: 612 mg via INTRAVENOUS
  Filled 2022-11-07: qty 30.6

## 2022-11-07 MED ORDER — OXALIPLATIN CHEMO INJECTION 100 MG/20ML
85.0000 mg/m2 | Freq: Once | INTRAVENOUS | Status: AC
  Administered 2022-11-07: 130 mg via INTRAVENOUS
  Filled 2022-11-07: qty 20

## 2022-11-07 NOTE — Progress Notes (Signed)
Patient seen by Lonna Cobb NP today  Vitals are within treatment parameters:Yes   Labs are within treatment parameters: Yes   Treatment plan has been signed: Yes   Per physician team, Patient is ready for treatment and there are NO modifications to the treatment plan.

## 2022-11-07 NOTE — Progress Notes (Signed)
  [facility] OFFICE PROGRESS NOTE   Diagnosis: Small bowel carcinoma  INTERVAL HISTORY:   Austin [name] returns as scheduled.  He completed cycle 2 FOLFOX 10/24/2022.  He denies nausea/vomiting.  No mouth sores.  No diarrhea.  Cold sensitivity lasted about 3 days.  No persistent neuropathy symptoms.  He has a good appetite.  He denies pain.  Objective:  Vital signs in last 24 hours:  Blood pressure 133/71, pulse 74, temperature 98.1 F (36.7 C), temperature source Temporal, resp. rate 18, height 5\' 10"  (1.778 m), weight 105 lb 3.2 oz (47.7 kg), SpO2 100%.    HEENT: No thrush or ulcers. Resp: Distant breath sounds.  No respiratory distress. Cardio: Distant heart sounds.  Regular. GI: No hepatosplenomegaly. Vascular: No leg edema. Skin: Palms without erythema. Port-A-Cath without erythema.  Lab Results:  Lab Results  Component Value Date   WBC 6.1 11/07/2022   HGB 12.3 (L) 11/07/2022   HCT 36.4 (L) 11/07/2022   MCV 94.3 11/07/2022   PLT 162 11/07/2022   NEUTROABS 3.6 11/07/2022    Imaging:  No results found.  Medications: I have reviewed the patient's current medications.  Assessment/Plan: Small bowel carcinoma-stage IV (pT4,pN1,pM1 Admission 06/26/2022 with a small bowel obstruction CT abdomen/pelvis 06/26/2022-small bowel obstruction with transition in the lower abdomen/pelvis, irregular nodular densities in the right lower lobe CT angiogram abdomen/pelvis 06/27/2022-circumferential masslike thickening at the terminal ileum, progressive peritoneal soft tissue at the right paracolic gutter, increase in a posterior lateral left bladder mass, resolution of small bowel dilation, increased distal esophageal wall thickening compared to October 2023 06/30/2022-exploratory laparotomy, right hemicolectomy, and peritoneal biopsy-firm mass at the distal ileum with peritoneal implants-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, tumor perforates the  visceral peritoneum and invades the cecum, negative resection margins, 2/13 nodes, 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  COPD 3.  CAD 4.  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  Enlarging bladder mass on CT 06/27/2022 6.  Mother with cervical cancer 7.  Port-A-Cath placement 10/02/2022    Disposition: [name] appears stable.  He has completed 2 cycles of FOLFOX.  He continues to tolerate chemotherapy well.  Plan to proceed with cycle 3 today as scheduled.  CBC and chemistry panel reviewed.  Labs adequate to proceed as above.  He will return for follow-up and treatment in 2 weeks.  We are available to see him sooner if needed.    Austin [name] ANP/GNP-BC   11/07/2022  9:23 AM

## 2022-11-07 NOTE — Patient Instructions (Signed)
[place] [facility]  Discharge Instructions: Thank you for choosing [facility] to provide your oncology and hematology care.   If you have a lab appointment with the Cancer Center, please go directly to the Cancer Center and check in at the registration area.   Wear comfortable clothing and clothing appropriate for easy access to any Portacath or PICC line.   We strive to give you quality time with your provider. You may need to reschedule your appointment if you arrive late (15 or more minutes).  Arriving late affects you and other patients whose appointments are after yours.  Also, if you miss three or more appointments without notifying the office, you may be dismissed from the clinic at the provider's discretion.      For prescription refill requests, have your pharmacy contact our office and allow 72 hours for refills to be completed.    Today you received the following chemotherapy and/or immunotherapy agents Oxaliplatin, Leucovorin and Adrucil       To help prevent nausea and vomiting after your treatment, we encourage you to take your nausea medication as directed.  BELOW ARE SYMPTOMS THAT SHOULD BE REPORTED IMMEDIATELY: *FEVER GREATER THAN 100.4 F (38 C) OR HIGHER *CHILLS OR SWEATING *NAUSEA AND VOMITING THAT IS NOT CONTROLLED WITH YOUR NAUSEA MEDICATION *UNUSUAL SHORTNESS OF BREATH *UNUSUAL BRUISING OR BLEEDING *URINARY PROBLEMS (pain or burning when urinating, or frequent urination) *BOWEL PROBLEMS (unusual diarrhea, constipation, pain near the anus) TENDERNESS IN MOUTH AND THROAT WITH OR WITHOUT PRESENCE OF ULCERS (sore throat, sores in mouth, or a toothache) UNUSUAL RASH, SWELLING OR PAIN  UNUSUAL VAGINAL DISCHARGE OR ITCHING   Items with * indicate a potential emergency and should be followed up as soon as possible or go to the Emergency Department if any problems should occur.  Please show the CHEMOTHERAPY ALERT CARD or  IMMUNOTHERAPY ALERT CARD at check-in to the Emergency Department and triage nurse.  Should you have questions after your visit or need to cancel or reschedule your appointment, please contact [place] [facility] AT [facility]  Dept: [phone]  and follow the prompts.  Office hours are 8:00 a.m. to 4:30 p.m. Monday - Friday. Please note that voicemails left after 4:00 p.m. may not be returned until the following business day.  We are closed weekends and major holidays. You have access to a nurse at all times for urgent questions. Please call the main number to the clinic Dept: [phone] and follow the prompts.   For any non-urgent questions, you may also contact your provider using MyChart. We now offer e-Visits for anyone 25 and older to request care online for non-urgent symptoms. For details visit [email or web address].   Also download the MyChart app! Go to the app store, search "MyChart", open the app, select , and log in with your MyChart username and password.

## 2022-11-09 ENCOUNTER — Inpatient Hospital Stay: Payer: Medicare (Managed Care)

## 2022-11-09 ENCOUNTER — Other Ambulatory Visit: Payer: Self-pay

## 2022-11-09 VITALS — BP 113/68 | HR 91 | Temp 98.0°F | Resp 18

## 2022-11-09 DIAGNOSIS — C179 Malignant neoplasm of small intestine, unspecified: Secondary | ICD-10-CM

## 2022-11-09 DIAGNOSIS — Z5111 Encounter for antineoplastic chemotherapy: Secondary | ICD-10-CM | POA: Diagnosis not present

## 2022-11-09 MED ORDER — HEPARIN SOD (PORK) LOCK FLUSH 100 UNIT/ML IV SOLN
500.0000 [IU] | Freq: Once | INTRAVENOUS | Status: AC | PRN
  Administered 2022-11-09: 500 [IU]

## 2022-11-09 MED ORDER — SODIUM CHLORIDE 0.9% FLUSH
10.0000 mL | INTRAVENOUS | Status: DC | PRN
  Administered 2022-11-09: 10 mL

## 2022-11-09 NOTE — Patient Instructions (Signed)

## 2022-11-17 ENCOUNTER — Other Ambulatory Visit: Payer: Self-pay | Admitting: Oncology

## 2022-11-17 DIAGNOSIS — C179 Malignant neoplasm of small intestine, unspecified: Secondary | ICD-10-CM

## 2022-11-21 ENCOUNTER — Inpatient Hospital Stay: Payer: Medicare (Managed Care)

## 2022-11-21 ENCOUNTER — Inpatient Hospital Stay: Payer: Medicare (Managed Care) | Attending: Oncology

## 2022-11-21 ENCOUNTER — Encounter: Payer: Self-pay | Admitting: Nurse Practitioner

## 2022-11-21 ENCOUNTER — Inpatient Hospital Stay (HOSPITAL_BASED_OUTPATIENT_CLINIC_OR_DEPARTMENT_OTHER): Payer: Medicare (Managed Care) | Admitting: Nurse Practitioner

## 2022-11-21 VITALS — BP 120/54 | HR 51

## 2022-11-21 VITALS — BP 109/65 | HR 74 | Temp 98.2°F | Resp 18 | Ht 70.0 in | Wt 100.7 lb

## 2022-11-21 DIAGNOSIS — C172 Malignant neoplasm of ileum: Secondary | ICD-10-CM | POA: Diagnosis present

## 2022-11-21 DIAGNOSIS — Z95828 Presence of other vascular implants and grafts: Secondary | ICD-10-CM

## 2022-11-21 DIAGNOSIS — C179 Malignant neoplasm of small intestine, unspecified: Secondary | ICD-10-CM

## 2022-11-21 DIAGNOSIS — Z5111 Encounter for antineoplastic chemotherapy: Secondary | ICD-10-CM | POA: Insufficient documentation

## 2022-11-21 DIAGNOSIS — I2510 Atherosclerotic heart disease of native coronary artery without angina pectoris: Secondary | ICD-10-CM | POA: Insufficient documentation

## 2022-11-21 DIAGNOSIS — Z452 Encounter for adjustment and management of vascular access device: Secondary | ICD-10-CM | POA: Diagnosis not present

## 2022-11-21 DIAGNOSIS — J449 Chronic obstructive pulmonary disease, unspecified: Secondary | ICD-10-CM | POA: Diagnosis not present

## 2022-11-21 LAB — CMP (CANCER CENTER ONLY)
ALT: 42 U/L (ref 0–44)
AST: 32 U/L (ref 15–41)
Albumin: 4.1 g/dL (ref 3.5–5.0)
Alkaline Phosphatase: 70 U/L (ref 38–126)
Anion gap: 11 (ref 5–15)
BUN: 27 mg/dL — ABNORMAL HIGH (ref 8–23)
CO2: 31 mmol/L (ref 22–32)
Calcium: 9.8 mg/dL (ref 8.9–10.3)
Chloride: 98 mmol/L (ref 98–111)
Creatinine: 1.07 mg/dL (ref 0.61–1.24)
GFR, Estimated: >60 mL/min (ref >60)
Glucose, Bld: 129 mg/dL — ABNORMAL HIGH (ref 70–99)
Potassium: 3.4 mmol/L — ABNORMAL LOW (ref 3.5–5.1)
Sodium: 140 mmol/L (ref 135–145)
Total Bilirubin: 0.7 mg/dL (ref 0.3–1.2)
Total Protein: 6.6 g/dL (ref 6.5–8.1)

## 2022-11-21 LAB — CBC WITH DIFFERENTIAL (CANCER CENTER ONLY)
Abs Immature Granulocytes: 0.03 10*3/uL (ref 0.00–0.07)
Basophils Absolute: 0 10*3/uL (ref 0.0–0.1)
Basophils Relative: 1 %
Eosinophils Absolute: 0.2 10*3/uL (ref 0.0–0.5)
Eosinophils Relative: 3 %
HCT: 38.7 % — ABNORMAL LOW (ref 39.0–52.0)
Hemoglobin: 13.3 g/dL (ref 13.0–17.0)
Immature Granulocytes: 1 %
Lymphocytes Relative: 34 %
Lymphs Abs: 1.7 10*3/uL (ref 0.7–4.0)
MCH: 32 pg (ref 26.0–34.0)
MCHC: 34.4 g/dL (ref 30.0–36.0)
MCV: 93 fL (ref 80.0–100.0)
Monocytes Absolute: 0.6 10*3/uL (ref 0.1–1.0)
Monocytes Relative: 13 %
Neutro Abs: 2.5 10*3/uL (ref 1.7–7.7)
Neutrophils Relative %: 48 %
Platelet Count: 134 10*3/uL — ABNORMAL LOW (ref 150–400)
RBC: 4.16 MIL/uL — ABNORMAL LOW (ref 4.22–5.81)
RDW: 15.1 % (ref 11.5–15.5)
WBC Count: 5 10*3/uL (ref 4.0–10.5)
nRBC: 0 % (ref 0.0–0.2)

## 2022-11-21 MED ORDER — HEPARIN SOD (PORK) LOCK FLUSH 100 UNIT/ML IV SOLN
500.0000 [IU] | Freq: Once | INTRAVENOUS | Status: AC
  Administered 2022-11-21: 500 [IU] via INTRAVENOUS

## 2022-11-21 MED ORDER — SODIUM CHLORIDE 0.9% FLUSH
10.0000 mL | Freq: Once | INTRAVENOUS | Status: AC
  Administered 2022-11-21: 10 mL via INTRAVENOUS

## 2022-11-21 MED ORDER — SODIUM CHLORIDE 0.9 % IV SOLN: INTRAVENOUS | Status: AC

## 2022-11-21 NOTE — Progress Notes (Signed)
Patient seen by Lonna Cobb NP today  Vitals are within treatment parameters:Yes   Labs are within treatment parameters: No (Please specify and give further instructions.) K+ 3.4  Treatment plan has been signed: Yes   Per physician team, Patient will not be receiving treatment today. Liter of IVF, order is in.

## 2022-11-21 NOTE — Patient Instructions (Signed)

## 2022-11-21 NOTE — Patient Instructions (Signed)

## 2022-11-21 NOTE — Progress Notes (Signed)
[facility] OFFICE PROGRESS NOTE   Diagnosis: Small bowel carcinoma  INTERVAL HISTORY:   Mr. Sleight returns as scheduled.  He completed cycle 3 FOLFOX 11/07/2022.  He developed nausea around day 4.  Nausea persisted intermittently for about 6 days.  He took Zofran and Compazine as needed.  Noted Zofran probably helped the most.  No significant mouth sores.  No diarrhea.  Cold sensitivity lasted 4 days.  No persistent neuropathy symptoms.  3 days ago he developed nausea/vomiting and abdominal pain.  Symptoms improved when he drank apple cider.  Energy level is poor.  He indicates suboptimal fluid intake.  He drinks coffee, not much else.  Objective:  Vital signs in last 24 hours:  Blood pressure 109/65, pulse 74, temperature 98.2 F (36.8 C), temperature source Temporal, resp. rate 18, height 5\' 10"  (1.778 m), weight 100 lb 11.2 oz (45.7 kg), SpO2 99%.    HEENT: Mouth is dry appearing.  No thrush or ulcers. Resp: Distant breath sounds. Cardio: Regular rate and rhythm.  Distant heart sounds. GI: No hepatosplenomegaly.  No mass.  Tenderness soft and nontender. Vascular: No leg edema. Skin: Decreased skin turgor. Port-A-Cath without erythema.  Lab Results:  Lab Results  Component Value Date   WBC 5.0 11/21/2022   HGB 13.3 11/21/2022   HCT 38.7 (L) 11/21/2022   MCV 93.0 11/21/2022   PLT 134 (L) 11/21/2022   NEUTROABS 2.5 11/21/2022    Imaging:  No results found.  Medications: I have reviewed the patient's current medications.  Assessment/Plan: Small bowel carcinoma-stage IV (pT4,pN1,pM1 Admission 06/26/2022 with a small bowel obstruction CT abdomen/pelvis 06/26/2022-small bowel obstruction with transition in the lower abdomen/pelvis, irregular nodular densities in the right lower lobe CT angiogram abdomen/pelvis 06/27/2022-circumferential masslike thickening at the terminal ileum, progressive peritoneal soft tissue at the right paracolic gutter, increase in a  posterior lateral left bladder mass, resolution of small bowel dilation, increased distal esophageal wall thickening compared to October 2023 06/30/2022-exploratory laparotomy, right hemicolectomy, and peritoneal biopsy-firm mass at the distal ileum with peritoneal implants-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, tumor perforates the visceral peritoneum and invades the cecum, negative resection margins, 2/13 nodes, 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  COPD 3.  CAD 4.  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  Enlarging bladder mass on CT 06/27/2022 6.  Mother with cervical cancer 7.  Port-A-Cath placement 10/02/2022  Disposition: Mr. Faulkenberry appears stable.  He has completed 3 cycles of FOLFOX.  He had delayed nausea and then more recently acute onset of nausea/vomiting/abdominal pain.  He feels better today but remains weak.  Question if symptoms indicate intermittent obstruction.  Performance status is poor.  He appears dehydrated.  We are holding today's treatment.  He will receive IV fluids.  Plan for restaging CT scans in the next week or so.  Return for follow-up 11/30/2022.  He will contact the office in the interim with recurrent nausea/vomiting/abdominal pain/constipation.  We discussed oral hydration at length.  He will try to increase fluid intake at home.    Lonna Cobb ANP/GNP-BC  11/21/2022  10:33  AM

## 2022-11-23 ENCOUNTER — Inpatient Hospital Stay: Payer: Medicare (Managed Care)

## 2022-11-27 ENCOUNTER — Ambulatory Visit: Payer: Medicare (Managed Care) | Admitting: Nurse Practitioner

## 2022-11-27 ENCOUNTER — Other Ambulatory Visit: Payer: Medicare (Managed Care)

## 2022-11-27 ENCOUNTER — Inpatient Hospital Stay: Payer: Medicare (Managed Care)

## 2022-11-27 ENCOUNTER — Ambulatory Visit (HOSPITAL_BASED_OUTPATIENT_CLINIC_OR_DEPARTMENT_OTHER)
Admission: RE | Admit: 2022-11-27 | Discharge: 2022-11-27 | Disposition: A | Discharge status: Home / Self Care | Payer: Medicare (Managed Care) | Source: Ambulatory Visit | Attending: Nurse Practitioner | Admitting: Nurse Practitioner

## 2022-11-27 ENCOUNTER — Encounter (HOSPITAL_BASED_OUTPATIENT_CLINIC_OR_DEPARTMENT_OTHER): Payer: Self-pay

## 2022-11-27 DIAGNOSIS — Z5111 Encounter for antineoplastic chemotherapy: Secondary | ICD-10-CM | POA: Diagnosis not present

## 2022-11-27 DIAGNOSIS — C179 Malignant neoplasm of small intestine, unspecified: Secondary | ICD-10-CM | POA: Diagnosis present

## 2022-11-27 LAB — BASIC METABOLIC PANEL - CANCER CENTER ONLY
Anion gap: 7 (ref 5–15)
BUN: 19 mg/dL (ref 8–23)
CO2: 29 mmol/L (ref 22–32)
Calcium: 9.2 mg/dL (ref 8.9–10.3)
Chloride: 103 mmol/L (ref 98–111)
Creatinine: 0.88 mg/dL (ref 0.61–1.24)
GFR, Estimated: >60 mL/min (ref >60)
Glucose, Bld: 126 mg/dL — ABNORMAL HIGH (ref 70–99)
Potassium: 4.1 mmol/L (ref 3.5–5.1)
Sodium: 139 mmol/L (ref 135–145)

## 2022-11-27 MED ORDER — IOHEXOL 300 MG/ML  SOLN
100.0000 mL | Freq: Once | INTRAMUSCULAR | Status: AC | PRN
  Administered 2022-11-27: 100 mL via INTRAVENOUS

## 2022-11-27 MED ORDER — HEPARIN SOD (PORK) LOCK FLUSH 100 UNIT/ML IV SOLN
500.0000 [IU] | Freq: Once | INTRAVENOUS | Status: AC
  Administered 2022-11-27: 500 [IU] via INTRAVENOUS

## 2022-11-27 NOTE — Patient Instructions (Signed)

## 2022-11-30 ENCOUNTER — Inpatient Hospital Stay: Payer: Medicare (Managed Care)

## 2022-11-30 ENCOUNTER — Encounter: Payer: Self-pay | Admitting: Nurse Practitioner

## 2022-11-30 ENCOUNTER — Ambulatory Visit: Payer: Medicare (Managed Care)

## 2022-11-30 ENCOUNTER — Inpatient Hospital Stay (HOSPITAL_BASED_OUTPATIENT_CLINIC_OR_DEPARTMENT_OTHER): Payer: Medicare (Managed Care) | Admitting: Nurse Practitioner

## 2022-11-30 VITALS — BP 134/59 | HR 66 | Temp 98.2°F | Resp 18 | Ht 70.0 in | Wt 106.7 lb

## 2022-11-30 VITALS — BP 138/77 | HR 51 | Resp 18

## 2022-11-30 DIAGNOSIS — C179 Malignant neoplasm of small intestine, unspecified: Secondary | ICD-10-CM

## 2022-11-30 DIAGNOSIS — Z5111 Encounter for antineoplastic chemotherapy: Secondary | ICD-10-CM | POA: Diagnosis not present

## 2022-11-30 LAB — CMP (CANCER CENTER ONLY)
ALT: 30 U/L (ref 0–44)
AST: 20 U/L (ref 15–41)
Albumin: 3.8 g/dL (ref 3.5–5.0)
Alkaline Phosphatase: 85 U/L (ref 38–126)
Anion gap: 8 (ref 5–15)
BUN: 15 mg/dL (ref 8–23)
CO2: 28 mmol/L (ref 22–32)
Calcium: 9.1 mg/dL (ref 8.9–10.3)
Chloride: 104 mmol/L (ref 98–111)
Creatinine: 0.98 mg/dL (ref 0.61–1.24)
GFR, Estimated: >60 mL/min (ref >60)
Glucose, Bld: 182 mg/dL — ABNORMAL HIGH (ref 70–99)
Potassium: 3.7 mmol/L (ref 3.5–5.1)
Sodium: 140 mmol/L (ref 135–145)
Total Bilirubin: 0.4 mg/dL (ref 0.3–1.2)
Total Protein: 6.1 g/dL — ABNORMAL LOW (ref 6.5–8.1)

## 2022-11-30 LAB — CBC WITH DIFFERENTIAL (CANCER CENTER ONLY)
Abs Immature Granulocytes: 0.04 10*3/uL (ref 0.00–0.07)
Basophils Absolute: 0.1 10*3/uL (ref 0.0–0.1)
Basophils Relative: 1 %
Eosinophils Absolute: 0.3 10*3/uL (ref 0.0–0.5)
Eosinophils Relative: 7 %
HCT: 36 % — ABNORMAL LOW (ref 39.0–52.0)
Hemoglobin: 12.1 g/dL — ABNORMAL LOW (ref 13.0–17.0)
Immature Granulocytes: 1 %
Lymphocytes Relative: 33 %
Lymphs Abs: 1.4 10*3/uL (ref 0.7–4.0)
MCH: 32.6 pg (ref 26.0–34.0)
MCHC: 33.6 g/dL (ref 30.0–36.0)
MCV: 97 fL (ref 80.0–100.0)
Monocytes Absolute: 0.8 10*3/uL (ref 0.1–1.0)
Monocytes Relative: 18 %
Neutro Abs: 1.7 10*3/uL (ref 1.7–7.7)
Neutrophils Relative %: 40 %
Platelet Count: 206 10*3/uL (ref 150–400)
RBC: 3.71 MIL/uL — ABNORMAL LOW (ref 4.22–5.81)
RDW: 16.1 % — ABNORMAL HIGH (ref 11.5–15.5)
WBC Count: 4.3 10*3/uL (ref 4.0–10.5)
nRBC: 0 % (ref 0.0–0.2)

## 2022-11-30 MED ORDER — SODIUM CHLORIDE 0.9 % IV SOLN: 10.0000 mg | Freq: Once | INTRAVENOUS | Status: DC

## 2022-11-30 MED ORDER — DEXAMETHASONE SODIUM PHOSPHATE 10 MG/ML IJ SOLN
10.0000 mg | Freq: Once | INTRAMUSCULAR | Status: AC
  Administered 2022-11-30: 10 mg via INTRAVENOUS
  Filled 2022-11-30: qty 1

## 2022-11-30 MED ORDER — FLUOROURACIL CHEMO INJECTION 2.5 GM/50ML
400.0000 mg/m2 | Freq: Once | INTRAVENOUS | Status: AC
  Administered 2022-11-30: 600 mg via INTRAVENOUS
  Filled 2022-11-30: qty 12

## 2022-11-30 MED ORDER — LEUCOVORIN CALCIUM INJECTION 350 MG
400.0000 mg/m2 | Freq: Once | INTRAVENOUS | Status: AC
  Administered 2022-11-30: 612 mg via INTRAVENOUS
  Filled 2022-11-30: qty 30.6

## 2022-11-30 MED ORDER — SODIUM CHLORIDE 0.9 % IV SOLN
2400.0000 mg/m2 | INTRAVENOUS | Status: DC
  Administered 2022-11-30: 3500 mg via INTRAVENOUS
  Filled 2022-11-30: qty 70

## 2022-11-30 MED ORDER — OXALIPLATIN CHEMO INJECTION 100 MG/20ML
65.0000 mg/m2 | Freq: Once | INTRAVENOUS | Status: AC
  Administered 2022-11-30: 100 mg via INTRAVENOUS
  Filled 2022-11-30: qty 20

## 2022-11-30 MED ORDER — DEXTROSE 5 % IV SOLN: Freq: Once | INTRAVENOUS | Status: AC

## 2022-11-30 MED ORDER — ONDANSETRON 8 MG PO TBDP: 8.0000 mg | ORAL_TABLET | Freq: Three times a day (TID) | ORAL | 1 refills | Status: DC | PRN

## 2022-11-30 MED ORDER — PALONOSETRON HCL INJECTION 0.25 MG/5ML
0.2500 mg | Freq: Once | INTRAVENOUS | Status: AC
  Administered 2022-11-30: 0.25 mg via INTRAVENOUS
  Filled 2022-11-30: qty 5

## 2022-11-30 NOTE — Progress Notes (Signed)
Patient seen by Lonna Cobb NP today  Vitals are within treatment parameters:Yes   Labs are within treatment parameters: Yes   Treatment plan has been signed: Yes   Per physician team, Patient is ready for treatment. Please note the following modifications: dose reduce of oxaliplatin.

## 2022-11-30 NOTE — Progress Notes (Signed)
[facility] OFFICE PROGRESS NOTE   Diagnosis: Small bowel carcinoma  INTERVAL HISTORY:   Mr. Azzara returns as scheduled.  He completed cycle 3 FOLFOX 11/07/2022.  Cycle 4 was held 11/21/2022 due to a poor performance status, dehydration, episode of nausea/vomiting/abdominal pain.  He received IV fluids, requested restaging CT scans.  He is feeling much better.  No further nausea/vomiting or abdominal pain.  Oral intake is better.  He is gaining weight.  No diarrhea.  No mouth sores.  On average the cold sensitivity last 1-1/2 days.  No persistent neuropathy symptoms.  Objective:  Vital signs in last 24 hours:  Blood pressure (!) 134/59, pulse 66, temperature 98.2 F (36.8 C), temperature source Oral, resp. rate 18, height 5\' 10"  (1.778 m), weight 106 lb 11.2 oz (48.4 kg), SpO2 100%.    HEENT: No thrush or ulcers. Resp: Distant breath sounds.  No respiratory distress. Cardio: Regular, distant heart sounds. GI: No hepatosplenomegaly. Vascular: No leg edema. Skin: Palms without erythema. Port-A-Cath without erythema.  Lab Results:  Lab Results  Component Value Date   WBC 5.0 11/21/2022   HGB 13.3 11/21/2022   HCT 38.7 (L) 11/21/2022   MCV 93.0 11/21/2022   PLT 134 (L) 11/21/2022   NEUTROABS 2.5 11/21/2022    Imaging:  No results found.  Medications: I have reviewed the patient's current medications.  Assessment/Plan: Small bowel carcinoma-stage IV (pT4,pN1,pM1 Admission 06/26/2022 with a small bowel obstruction CT abdomen/pelvis 06/26/2022-small bowel obstruction with transition in the lower abdomen/pelvis, irregular nodular densities in the right lower lobe CT angiogram abdomen/pelvis 06/27/2022-circumferential masslike thickening at the terminal ileum, progressive peritoneal soft tissue at the right paracolic gutter, increase in a posterior lateral left bladder mass, resolution of small bowel dilation, increased distal esophageal wall thickening compared  to October 2023 06/30/2022-exploratory laparotomy, right hemicolectomy, and peritoneal biopsy-firm mass at the distal ileum with peritoneal implants-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, tumor perforates the visceral peritoneum and invades the cecum, negative resection margins, 2/13 nodes, 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  No evidence of progressive disease. Cycle 4 FOLFOX 11/30/2022, oxaliplatin dose reduced   2.  COPD 3.  CAD 4.  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  Enlarging bladder mass on CT 06/27/2022 6.  Mother with cervical cancer 7.  Port-A-Cath placement 10/02/2022  Disposition: Mr. Camerer appears stable.  He has completed 3 cycles of FOLFOX.  Cycle 4 was held last week due to nausea/vomiting, abdominal pain, failure to thrive.  He appeared dehydrated, received IV fluids.  His performance status has improved considerably compared to last week.  Restaging CT scans from 11/27/2022 show improvement.  Results/images reviewed with Mr. [place] and his wife at today's visit.  They agree with the recommendation to continue FOLFOX.  Plan to proceed with cycle 4 FOLFOX today as scheduled.  Oxaliplatin will be dose reduced due to poor tolerance of cycle 3.  CBC reviewed.  Counts adequate to proceed as above.  He will return for follow-up and treatment  as scheduled 12/19/2022.  We are  available to see him sooner if needed.  Patient seen with Dr. Truett Perna.  Lonna Cobb ANP/GNP-BC   11/30/2022  9:11 AM  This was a shared visit with Lonna Cobb.  Mr. Micciche has completed 3 cycles of FOLFOX.  We reviewed the restaging CT findings and images with Mr. Hamor.  There is no radiologic evidence of disease progression.  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  The plan is to continue FOLFOX.  We decided to dose reduce the oxaliplatin secondary to nausea/vomiting and failure to thrive following the last cycle of chemotherapy.  I was present for greater than 50% of today's visit.  I performed medical decision making.  Mancel Bale, MD

## 2022-11-30 NOTE — Patient Instructions (Signed)
[place] [facility]   Discharge Instructions: Thank you for choosing [facility] to provide your oncology and hematology care.   If you have a lab appointment with the Cancer Center, please go directly to the Cancer Center and check in at the registration area.   Wear comfortable clothing and clothing appropriate for easy access to any Portacath or PICC line.   We strive to give you quality time with your provider. You may need to reschedule your appointment if you arrive late (15 or more minutes).  Arriving late affects you and other patients whose appointments are after yours.  Also, if you miss three or more appointments without notifying the office, you may be dismissed from the clinic at the provider's discretion.      For prescription refill requests, have your pharmacy contact our office and allow 72 hours for refills to be completed.    Today you received the following chemotherapy and/or immunotherapy agents Oxaliplatin (ELOXATIN), Leucovorin & Flourouracil (ADRUCIL).      To help prevent nausea and vomiting after your treatment, we encourage you to take your nausea medication as directed.  BELOW ARE SYMPTOMS THAT SHOULD BE REPORTED IMMEDIATELY: *FEVER GREATER THAN 100.4 F (38 C) OR HIGHER *CHILLS OR SWEATING *NAUSEA AND VOMITING THAT IS NOT CONTROLLED WITH YOUR NAUSEA MEDICATION *UNUSUAL SHORTNESS OF BREATH *UNUSUAL BRUISING OR BLEEDING *URINARY PROBLEMS (pain or burning when urinating, or frequent urination) *BOWEL PROBLEMS (unusual diarrhea, constipation, pain near the anus) TENDERNESS IN MOUTH AND THROAT WITH OR WITHOUT PRESENCE OF ULCERS (sore throat, sores in mouth, or a toothache) UNUSUAL RASH, SWELLING OR PAIN  UNUSUAL VAGINAL DISCHARGE OR ITCHING   Items with * indicate a potential emergency and should be followed up as soon as possible or go to the Emergency Department if any problems should occur.  Please show the  CHEMOTHERAPY ALERT CARD or IMMUNOTHERAPY ALERT CARD at check-in to the Emergency Department and triage nurse.  Should you have questions after your visit or need to cancel or reschedule your appointment, please contact [place] [facility]  Dept: [phone]  and follow the prompts.  Office hours are 8:00 a.m. to 4:30 p.m. Monday - Friday. Please note that voicemails left after 4:00 p.m. may not be returned until the following business day.  We are closed weekends and major holidays. You have access to a nurse at all times for urgent questions. Please call the main number to the clinic Dept: [phone] and follow the prompts.   For any non-urgent questions, you may also contact your provider using MyChart. We now offer e-Visits for anyone 52 and older to request care online for non-urgent symptoms. For details visit [email or web address].   Also download the MyChart app! Go to the app store, search "MyChart", open the app, select [place], and log in with your MyChart username and password.  Oxaliplatin Injection What is this medication? OXALIPLATIN (ox AL i PLA tin) treats colorectal cancer. It works by slowing down the growth of cancer cells. This medicine may be used for other purposes; ask your health care provider or pharmacist if you have questions. COMMON BRAND NAME(S): Eloxatin What should I tell my care team before I take this medication? They need to know if you have any of these conditions: Heart disease History of irregular heartbeat or rhythm Liver disease Low blood cell levels (white cells, red cells, and platelets) Lung or breathing disease, such as asthma Take medications that treat  or prevent blood clots Tingling of the fingers, toes, or other nerve disorder An unusual or allergic reaction to oxaliplatin, other medications, foods, dyes, or preservatives If you or your partner are pregnant or trying to get pregnant Breast-feeding How should  I use this medication? This medication is injected into a vein. It is given by your care team in a hospital or clinic setting. Talk to your care team about the use of this medication in children. Special care may be needed. Overdosage: If you think you have taken too much of this medicine contact a poison control center or emergency room at once. NOTE: This medicine is only for you. Do not share this medicine with others. What if I miss a dose? Keep appointments for follow-up doses. It is important not to miss a dose. Call your care team if you are unable to keep an appointment. What may interact with this medication? Do not take this medication with any of the following: Cisapride Dronedarone Pimozide Thioridazine This medication may also interact with the following: Aspirin and aspirin-like medications Certain medications that treat or prevent blood clots, such as warfarin, apixaban, dabigatran, and rivaroxaban Cisplatin Cyclosporine Diuretics Medications for infection, such as acyclovir, adefovir, amphotericin B, bacitracin, cidofovir, foscarnet, ganciclovir, gentamicin, pentamidine, vancomycin NSAIDs, medications for pain and inflammation, such as ibuprofen or naproxen Other medications that cause heart rhythm changes Pamidronate Zoledronic acid This list may not describe all possible interactions. Give your health care provider a list of all the medicines, herbs, non-prescription drugs, or dietary supplements you use. Also tell them if you smoke, drink alcohol, or use illegal drugs. Some items may interact with your medicine. What should I watch for while using this medication? Your condition will be monitored carefully while you are receiving this medication. You may need blood work while taking this medication. This medication may make you feel generally unwell. This is not uncommon as chemotherapy can affect healthy cells as well as cancer cells. Report any side effects. Continue  your course of treatment even though you feel ill unless your care team tells you to stop. This medication may increase your risk of getting an infection. Call your care team for advice if you get a fever, chills, sore throat, or other symptoms of a cold or flu. Do not treat yourself. Try to avoid being around people who are sick. Avoid taking medications that contain aspirin, acetaminophen, ibuprofen, naproxen, or ketoprofen unless instructed by your care team. These medications may hide a fever. Be careful brushing or flossing your teeth or using a toothpick because you may get an infection or bleed more easily. If you have any dental work done, tell your dentist you are receiving this medication. This medication can make you more sensitive to cold. Do not drink cold drinks or use ice. Cover exposed skin before coming in contact with cold temperatures or cold objects. When out in cold weather wear warm clothing and cover your mouth and nose to warm the air that goes into your lungs. Tell your care team if you get sensitive to the cold. Talk to your care team if you or your partner are pregnant or think either of you might be pregnant. This medication can cause serious birth defects if taken during pregnancy and for 9 months after the last dose. A negative pregnancy test is required before starting this medication. A reliable form of contraception is recommended while taking this medication and for 9 months after the last dose. Talk to your care  team about effective forms of contraception. Do not father a child while taking this medication and for 6 months after the last dose. Use a condom while having sex during this time period. Do not breastfeed while taking this medication and for 3 months after the last dose. This medication may cause infertility. Talk to your care team if you are concerned about your fertility. What side effects may I notice from receiving this medication? Side effects that you  should report to your care team as soon as possible: Allergic reactions--skin rash, itching, hives, swelling of the face, lips, tongue, or throat Bleeding--bloody or black, tar-like stools, vomiting blood or brown material that looks like coffee grounds, red or dark brown urine, small red or purple spots on skin, unusual bruising or bleeding Dry cough, shortness of breath or trouble breathing Heart rhythm changes--fast or irregular heartbeat, dizziness, feeling faint or lightheaded, chest pain, trouble breathing Infection--fever, chills, cough, sore throat, wounds that don't heal, pain or trouble when passing urine, general feeling of discomfort or being unwell Liver injury--right upper belly pain, loss of appetite, nausea, light-colored stool, dark yellow or brown urine, yellowing skin or eyes, unusual weakness or fatigue Low red blood cell level--unusual weakness or fatigue, dizziness, headache, trouble breathing Muscle injury--unusual weakness or fatigue, muscle pain, dark yellow or brown urine, decrease in amount of urine Pain, tingling, or numbness in the hands or feet Sudden and severe headache, confusion, change in vision, seizures, which may be signs of posterior reversible encephalopathy syndrome (PRES) Unusual bruising or bleeding Side effects that usually do not require medical attention (report to your care team if they continue or are bothersome): Diarrhea Nausea Pain, redness, or swelling with sores inside the mouth or throat Unusual weakness or fatigue Vomiting This list may not describe all possible side effects. Call your doctor for medical advice about side effects. You may report side effects to FDA at 1-800-FDA-1088. Where should I keep my medication? This medication is given in a hospital or clinic. It will not be stored at home. NOTE: This sheet is a summary. It may not cover all possible information. If you have questions about this medicine, talk to your doctor,  pharmacist, or health care provider.  2024 Elsevier/Gold Standard (2021-11-15 00:00:00)   Leucovorin Injection What is this medication? LEUCOVORIN (loo koe VOR in) prevents side effects from certain medications, such as methotrexate. It works by increasing folate levels. This helps protect healthy cells in your body. It may also be used to treat anemia caused by low levels of folate. It can also be used with fluorouracil, a type of chemotherapy, to treat colorectal cancer. It works by increasing the effects of fluorouracil in the body. This medicine may be used for other purposes; ask your health care provider or pharmacist if you have questions. What should I tell my care team before I take this medication? They need to know if you have any of these conditions: Anemia from low levels of vitamin B12 in the blood An unusual or allergic reaction to leucovorin, folic acid, other medications, foods, dyes, or preservatives Pregnant or trying to get pregnant Breastfeeding How should I use this medication? This medication is injected into a vein or a muscle. It is given by your care team in a hospital or clinic setting. Talk to your care team about the use of this medication in children. Special care may be needed. Overdosage: If you think you have taken too much of this medicine contact a poison control  center or emergency room at once. NOTE: This medicine is only for you. Do not share this medicine with others. What if I miss a dose? Keep appointments for follow-up doses. It is important not to miss your dose. Call your care team if you are unable to keep an appointment. What may interact with this medication? Capecitabine Fluorouracil Phenobarbital Phenytoin Primidone Trimethoprim;sulfamethoxazole This list may not describe all possible interactions. Give your health care provider a list of all the medicines, herbs, non-prescription drugs, or dietary supplements you use. Also tell them if  you smoke, drink alcohol, or use illegal drugs. Some items may interact with your medicine. What should I watch for while using this medication? Your condition will be monitored carefully while you are receiving this medication. This medication may increase the side effects of 5-fluorouracil. Tell your care team if you have diarrhea or mouth sores that do not get better or that get worse. What side effects may I notice from receiving this medication? Side effects that you should report to your care team as soon as possible: Allergic reactions--skin rash, itching, hives, swelling of the face, lips, tongue, or throat This list may not describe all possible side effects. Call your doctor for medical advice about side effects. You may report side effects to FDA at 1-800-FDA-1088. Where should I keep my medication? This medication is given in a hospital or clinic. It will not be stored at home. NOTE: This sheet is a summary. It may not cover all possible information. If you have questions about this medicine, talk to your doctor, pharmacist, or health care provider.  2024 Elsevier/Gold Standard (2021-07-05 00:00:00)  Fluorouracil Injection What is this medication? FLUOROURACIL (flure oh YOOR a sil) treats some types of cancer. It works by slowing down the growth of cancer cells. This medicine may be used for other purposes; ask your health care provider or pharmacist if you have questions. COMMON BRAND NAME(S): Adrucil What should I tell my care team before I take this medication? They need to know if you have any of these conditions: Blood disorders Dihydropyrimidine dehydrogenase (DPD) deficiency Infection, such as chickenpox, cold sores, herpes Kidney disease Liver disease Poor nutrition Recent or ongoing radiation therapy An unusual or allergic reaction to fluorouracil, other medications, foods, dyes, or preservatives If you or your partner are pregnant or trying to get  pregnant Breast-feeding How should I use this medication? This medication is injected into a vein. It is administered by your care team in a hospital or clinic setting. Talk to your care team about the use of this medication in children. Special care may be needed. Overdosage: If you think you have taken too much of this medicine contact a poison control center or emergency room at once. NOTE: This medicine is only for you. Do not share this medicine with others. What if I miss a dose? Keep appointments for follow-up doses. It is important not to miss your dose. Call your care team if you are unable to keep an appointment. What may interact with this medication? Do not take this medication with any of the following: Live virus vaccines This medication may also interact with the following: Medications that treat or prevent blood clots, such as warfarin, enoxaparin, dalteparin This list may not describe all possible interactions. Give your health care provider a list of all the medicines, herbs, non-prescription drugs, or dietary supplements you use. Also tell them if you smoke, drink alcohol, or use illegal drugs. Some items may interact with  your medicine. What should I watch for while using this medication? Your condition will be monitored carefully while you are receiving this medication. This medication may make you feel generally unwell. This is not uncommon as chemotherapy can affect healthy cells as well as cancer cells. Report any side effects. Continue your course of treatment even though you feel ill unless your care team tells you to stop. In some cases, you may be given additional medications to help with side effects. Follow all directions for their use. This medication may increase your risk of getting an infection. Call your care team for advice if you get a fever, chills, sore throat, or other symptoms of a cold or flu. Do not treat yourself. Try to avoid being around people who are  sick. This medication may increase your risk to bruise or bleed. Call your care team if you notice any unusual bleeding. Be careful brushing or flossing your teeth or using a toothpick because you may get an infection or bleed more easily. If you have any dental work done, tell your dentist you are receiving this medication. Avoid taking medications that contain aspirin, acetaminophen, ibuprofen, naproxen, or ketoprofen unless instructed by your care team. These medications may hide a fever. Do not treat diarrhea with over the counter products. Contact your care team if you have diarrhea that lasts more than 2 days or if it is severe and watery. This medication can make you more sensitive to the sun. Keep out of the sun. If you cannot avoid being in the sun, wear protective clothing and sunscreen. Do not use sun lamps, tanning beds, or tanning booths. Talk to your care team if you or your partner wish to become pregnant or think you might be pregnant. This medication can cause serious birth defects if taken during pregnancy and for 3 months after the last dose. A reliable form of contraception is recommended while taking this medication and for 3 months after the last dose. Talk to your care team about effective forms of contraception. Do not father a child while taking this medication and for 3 months after the last dose. Use a condom while having sex during this time period. Do not breastfeed while taking this medication. This medication may cause infertility. Talk to your care team if you are concerned about your fertility. What side effects may I notice from receiving this medication? Side effects that you should report to your care team as soon as possible: Allergic reactions--skin rash, itching, hives, swelling of the face, lips, tongue, or throat Heart attack--pain or tightness in the chest, shoulders, arms, or jaw, nausea, shortness of breath, cold or clammy skin, feeling faint or  lightheaded Heart failure--shortness of breath, swelling of the ankles, feet, or hands, sudden weight gain, unusual weakness or fatigue Heart rhythm changes--fast or irregular heartbeat, dizziness, feeling faint or lightheaded, chest pain, trouble breathing High ammonia level--unusual weakness or fatigue, confusion, loss of appetite, nausea, vomiting, seizures Infection--fever, chills, cough, sore throat, wounds that don't heal, pain or trouble when passing urine, general feeling of discomfort or being unwell Low red blood cell level--unusual weakness or fatigue, dizziness, headache, trouble breathing Pain, tingling, or numbness in the hands or feet, muscle weakness, change in vision, confusion or trouble speaking, loss of balance or coordination, trouble walking, seizures Redness, swelling, and blistering of the skin over hands and feet Severe or prolonged diarrhea Unusual bruising or bleeding Side effects that usually do not require medical attention (report to your care team  if they continue or are bothersome): Dry skin Headache Increased tears Nausea Pain, redness, or swelling with sores inside the mouth or throat Sensitivity to light Vomiting This list may not describe all possible side effects. Call your doctor for medical advice about side effects. You may report side effects to FDA at 1-800-FDA-1088. Where should I keep my medication? This medication is given in a hospital or clinic. It will not be stored at home. NOTE: This sheet is a summary. It may not cover all possible information. If you have questions about this medicine, talk to your doctor, pharmacist, or health care provider.  2024 Elsevier/Gold Standard (2021-06-07 00:00:00)   The chemotherapy medication bag should finish at 46 hours, 96 hours, or 7 days. For example, if your pump is scheduled for 46 hours and it was put on at 4:00 p.m., it should finish at 2:00 p.m. the day it is scheduled to come off regardless of your  appointment time.     Estimated time to finish at 12:00 p.m. on Saturday 12/02/2022.   If the display on your pump reads "Low Volume" and it is beeping, take the batteries out of the pump and come to the cancer center for it to be taken off.   If the pump alarms go off prior to the pump reading "Low Volume" then call [phone] and someone can assist you.  If the plunger comes out and the chemotherapy medication is leaking out, please use your home chemo spill kit to clean up the spill. Do NOT use paper towels or other household products.  If you have problems or questions regarding your pump, please call either [phone] (24 hours a day) or the cancer center Monday-Friday 8:00 a.m.- 4:30 p.m. at the clinic number and we will assist you. If you are unable to get assistance, then go to the nearest Emergency Department and ask the staff to contact the IV team for assistance.

## 2022-12-01 ENCOUNTER — Other Ambulatory Visit: Payer: Self-pay

## 2022-12-02 ENCOUNTER — Inpatient Hospital Stay: Payer: Medicare (Managed Care)

## 2022-12-02 VITALS — BP 107/63 | HR 78 | Temp 98.4°F | Resp 17

## 2022-12-02 DIAGNOSIS — Z5111 Encounter for antineoplastic chemotherapy: Secondary | ICD-10-CM | POA: Diagnosis not present

## 2022-12-02 DIAGNOSIS — C179 Malignant neoplasm of small intestine, unspecified: Secondary | ICD-10-CM

## 2022-12-02 MED ORDER — SODIUM CHLORIDE 0.9% FLUSH
10.0000 mL | INTRAVENOUS | Status: DC | PRN
  Administered 2022-12-02: 10 mL

## 2022-12-02 MED ORDER — HEPARIN SOD (PORK) LOCK FLUSH 100 UNIT/ML IV SOLN
500.0000 [IU] | Freq: Once | INTRAVENOUS | Status: AC | PRN
  Administered 2022-12-02: 500 [IU]

## 2022-12-04 ENCOUNTER — Inpatient Hospital Stay: Payer: Medicare (Managed Care)

## 2022-12-04 ENCOUNTER — Inpatient Hospital Stay: Payer: Medicare (Managed Care) | Admitting: Oncology

## 2022-12-06 ENCOUNTER — Inpatient Hospital Stay: Payer: Medicare (Managed Care)

## 2022-12-14 ENCOUNTER — Other Ambulatory Visit: Payer: Self-pay | Admitting: Nurse Practitioner

## 2022-12-14 ENCOUNTER — Telehealth: Payer: Self-pay | Admitting: *Deleted

## 2022-12-14 NOTE — Telephone Encounter (Signed)
Called Mr. Wizner and provided him with appointment information for 11/5, 11/7 and next cycle as well.

## 2022-12-16 ENCOUNTER — Other Ambulatory Visit: Payer: Self-pay | Admitting: Oncology

## 2022-12-16 DIAGNOSIS — C179 Malignant neoplasm of small intestine, unspecified: Secondary | ICD-10-CM

## 2022-12-19 ENCOUNTER — Inpatient Hospital Stay: Payer: Medicare (Managed Care)

## 2022-12-19 ENCOUNTER — Other Ambulatory Visit: Payer: Self-pay

## 2022-12-19 ENCOUNTER — Inpatient Hospital Stay (HOSPITAL_BASED_OUTPATIENT_CLINIC_OR_DEPARTMENT_OTHER): Payer: Medicare (Managed Care) | Admitting: Nurse Practitioner

## 2022-12-19 ENCOUNTER — Inpatient Hospital Stay: Payer: Medicare (Managed Care) | Attending: Oncology

## 2022-12-19 ENCOUNTER — Other Ambulatory Visit: Payer: Medicare (Managed Care)

## 2022-12-19 ENCOUNTER — Encounter: Payer: Self-pay | Admitting: Nurse Practitioner

## 2022-12-19 ENCOUNTER — Ambulatory Visit: Payer: Medicare (Managed Care) | Admitting: Oncology

## 2022-12-19 ENCOUNTER — Ambulatory Visit: Payer: Medicare (Managed Care)

## 2022-12-19 VITALS — BP 122/64 | HR 85 | Temp 98.2°F | Resp 18 | Ht 70.0 in | Wt 104.0 lb

## 2022-12-19 DIAGNOSIS — G620 Drug-induced polyneuropathy: Secondary | ICD-10-CM | POA: Diagnosis not present

## 2022-12-19 DIAGNOSIS — N329 Bladder disorder, unspecified: Secondary | ICD-10-CM | POA: Diagnosis not present

## 2022-12-19 DIAGNOSIS — C179 Malignant neoplasm of small intestine, unspecified: Secondary | ICD-10-CM | POA: Diagnosis not present

## 2022-12-19 DIAGNOSIS — D649 Anemia, unspecified: Secondary | ICD-10-CM

## 2022-12-19 DIAGNOSIS — I2510 Atherosclerotic heart disease of native coronary artery without angina pectoris: Secondary | ICD-10-CM | POA: Insufficient documentation

## 2022-12-19 DIAGNOSIS — D709 Neutropenia, unspecified: Secondary | ICD-10-CM | POA: Insufficient documentation

## 2022-12-19 DIAGNOSIS — Z5189 Encounter for other specified aftercare: Secondary | ICD-10-CM | POA: Insufficient documentation

## 2022-12-19 DIAGNOSIS — R188 Other ascites: Secondary | ICD-10-CM | POA: Insufficient documentation

## 2022-12-19 DIAGNOSIS — C172 Malignant neoplasm of ileum: Secondary | ICD-10-CM | POA: Diagnosis present

## 2022-12-19 DIAGNOSIS — Z85828 Personal history of other malignant neoplasm of skin: Secondary | ICD-10-CM | POA: Diagnosis not present

## 2022-12-19 DIAGNOSIS — Z452 Encounter for adjustment and management of vascular access device: Secondary | ICD-10-CM | POA: Insufficient documentation

## 2022-12-19 DIAGNOSIS — Z5111 Encounter for antineoplastic chemotherapy: Secondary | ICD-10-CM | POA: Diagnosis present

## 2022-12-19 DIAGNOSIS — J449 Chronic obstructive pulmonary disease, unspecified: Secondary | ICD-10-CM | POA: Insufficient documentation

## 2022-12-19 LAB — CBC WITH DIFFERENTIAL (CANCER CENTER ONLY)
Abs Immature Granulocytes: 0.01 10*3/uL (ref 0.00–0.07)
Basophils Absolute: 0 10*3/uL (ref 0.0–0.1)
Basophils Relative: 1 %
Eosinophils Absolute: 0.2 10*3/uL (ref 0.0–0.5)
Eosinophils Relative: 5 %
HCT: 36.8 % — ABNORMAL LOW (ref 39.0–52.0)
Hemoglobin: 12.3 g/dL — ABNORMAL LOW (ref 13.0–17.0)
Immature Granulocytes: 0 %
Lymphocytes Relative: 34 %
Lymphs Abs: 1.1 10*3/uL (ref 0.7–4.0)
MCH: 32.6 pg (ref 26.0–34.0)
MCHC: 33.4 g/dL (ref 30.0–36.0)
MCV: 97.6 fL (ref 80.0–100.0)
Monocytes Absolute: 0.5 10*3/uL (ref 0.1–1.0)
Monocytes Relative: 17 %
Neutro Abs: 1.3 10*3/uL — ABNORMAL LOW (ref 1.7–7.7)
Neutrophils Relative %: 43 %
Platelet Count: 210 10*3/uL (ref 150–400)
RBC: 3.77 MIL/uL — ABNORMAL LOW (ref 4.22–5.81)
RDW: 16.9 % — ABNORMAL HIGH (ref 11.5–15.5)
WBC Count: 3.2 10*3/uL — ABNORMAL LOW (ref 4.0–10.5)
nRBC: 0 % (ref 0.0–0.2)

## 2022-12-19 LAB — CMP (CANCER CENTER ONLY)
ALT: 22 U/L (ref 0–44)
AST: 20 U/L (ref 15–41)
Albumin: 4 g/dL (ref 3.5–5.0)
Alkaline Phosphatase: 70 U/L (ref 38–126)
Anion gap: 8 (ref 5–15)
BUN: 20 mg/dL (ref 8–23)
CO2: 27 mmol/L (ref 22–32)
Calcium: 9.7 mg/dL (ref 8.9–10.3)
Chloride: 102 mmol/L (ref 98–111)
Creatinine: 0.99 mg/dL (ref 0.61–1.24)
GFR, Estimated: >60 mL/min (ref >60)
Glucose, Bld: 198 mg/dL — ABNORMAL HIGH (ref 70–99)
Potassium: 3.8 mmol/L (ref 3.5–5.1)
Sodium: 137 mmol/L (ref 135–145)
Total Bilirubin: 0.5 mg/dL (ref <1.2)
Total Protein: 5.9 g/dL — ABNORMAL LOW (ref 6.5–8.1)

## 2022-12-19 MED ORDER — DEXAMETHASONE SODIUM PHOSPHATE 10 MG/ML IJ SOLN
10.0000 mg | Freq: Once | INTRAMUSCULAR | Status: AC
  Administered 2022-12-19: 10 mg via INTRAVENOUS
  Filled 2022-12-19: qty 1

## 2022-12-19 MED ORDER — SODIUM CHLORIDE 0.9 % IV SOLN
2400.0000 mg/m2 | INTRAVENOUS | Status: DC
  Administered 2022-12-19: 3500 mg via INTRAVENOUS
  Filled 2022-12-19: qty 70

## 2022-12-19 MED ORDER — ONDANSETRON 8 MG PO TBDP: 8.0000 mg | ORAL_TABLET | Freq: Three times a day (TID) | ORAL | 3 refills | Status: DC | PRN

## 2022-12-19 MED ORDER — OXALIPLATIN CHEMO INJECTION 100 MG/20ML
50.0000 mg/m2 | Freq: Once | INTRAVENOUS | Status: AC
  Administered 2022-12-19: 75 mg via INTRAVENOUS
  Filled 2022-12-19: qty 15

## 2022-12-19 MED ORDER — LEUCOVORIN CALCIUM INJECTION 350 MG
400.0000 mg/m2 | Freq: Once | INTRAMUSCULAR | Status: AC
  Administered 2022-12-19: 612 mg via INTRAVENOUS
  Filled 2022-12-19: qty 30.6

## 2022-12-19 MED ORDER — DEXTROSE 5 % IV SOLN: Freq: Once | INTRAVENOUS | Status: AC

## 2022-12-19 MED ORDER — PALONOSETRON HCL INJECTION 0.25 MG/5ML
0.2500 mg | Freq: Once | INTRAVENOUS | Status: AC
  Administered 2022-12-19: 0.25 mg via INTRAVENOUS
  Filled 2022-12-19: qty 5

## 2022-12-19 MED ORDER — FLUOROURACIL CHEMO INJECTION 2.5 GM/50ML
400.0000 mg/m2 | Freq: Once | INTRAVENOUS | Status: AC
Start: 2022-12-19 — End: 2022-12-19
  Administered 2022-12-19: 600 mg via INTRAVENOUS
  Filled 2022-12-19: qty 12

## 2022-12-19 NOTE — Progress Notes (Signed)
[facility] OFFICE PROGRESS NOTE   Diagnosis: Small bowel carcinoma  INTERVAL HISTORY:   Mr. Dilger returns as scheduled.  He completed cycle 4 FOLFOX 11/30/2022.  He has had 2 episodes of vomiting since the last treatment.  He does not feel the vomiting was treatment related.  No mouth sores.  No diarrhea.  He had mild cold sensitivity involving the mouth and fingertips lasting 1 to 2 days.  No persistent neuropathy symptoms.  Objective:  Vital signs in last 24 hours:  Blood pressure 122/64, pulse 85, temperature 98.2 F (36.8 C), resp. rate 18, height 5\' 10"  (1.778 m), weight 104 lb (47.2 kg), SpO2 94%.    HEENT: No thrush or ulcers. Resp: Distant breath sounds.  No respiratory distress. Cardio: Distant heart sounds, regular. GI: No hepatosplenomegaly. Vascular: No leg edema. Neuro: Vibratory sense intact over the fingertips per tuning fork exam. Port-A-Cath without erythema.  Lab Results:  Lab Results  Component Value Date   WBC 3.2 (L) 12/19/2022   HGB 12.3 (L) 12/19/2022   HCT 36.8 (L) 12/19/2022   MCV 97.6 12/19/2022   PLT 210 12/19/2022   NEUTROABS 1.3 (L) 12/19/2022    Imaging:  No results found.  Medications: I have reviewed the patient's current medications.  Assessment/Plan: Small bowel carcinoma-stage IV (pT4,pN1,pM1 Admission 06/26/2022 with a small bowel obstruction CT abdomen/pelvis 06/26/2022-small bowel obstruction with transition in the lower abdomen/pelvis, irregular nodular densities in the right lower lobe CT angiogram abdomen/pelvis 06/27/2022-circumferential masslike thickening at the terminal ileum, progressive peritoneal soft tissue at the right paracolic gutter, increase in a posterior lateral left bladder mass, resolution of small bowel dilation, increased distal esophageal wall thickening compared to October 2023 06/30/2022-exploratory laparotomy, right hemicolectomy, and peritoneal biopsy-firm mass at the distal ileum with  peritoneal implants-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, tumor perforates the visceral peritoneum and invades the cecum, negative resection margins, 2/13 nodes, 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  No evidence of progressive disease. Cycle 4 FOLFOX 11/30/2022, oxaliplatin dose reduced Cycle 5 FOLFOX 12/19/2022, oxaliplatin dose reduced due to neutropenia   2.  COPD 3.  CAD 4.  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  Enlarging bladder mass on CT 06/27/2022 6.  Mother with cervical cancer 7.  Port-A-Cath placement 10/02/2022  Disposition: Mr. Hennick appears stable.  He has completed 4 cycles of FOLFOX.  Overall tolerating treatment well.  Plan to proceed with cycle 5 today as scheduled.  He has mild neutropenia.  We are attempting to contact his insurance company for approval to give white cell growth factor support.  If we are unable to confirm this can be given on the day of pump discontinuation Oxaliplatin will be further dose reduced.  We reviewed potential side effects with Mr. Enck including bone pain, rash, splenic rupture.  He agrees with the above plan.  He will return for follow-up and treatment as scheduled in 2 weeks.  He will contact the office in the interim with any problems.  We specifically discussed fever, chills, other signs of  infection.  Lonna Cobb ANP/GNP-BC   12/19/2022  9:24 AM  Addendum 11:03 AM-unable to confirm approval for white cell growth factor support.  Oxaliplatin dose reduced.

## 2022-12-19 NOTE — Progress Notes (Signed)
Patient seen by Lonna Cobb NP today  Vitals are within treatment parameters:Yes   Labs are within treatment parameters: No (Please specify and give further instructions.) WBC 3.2 Neut 1.3  Treatment plan has been signed: Yes   Per physician team, Patient is ready for treatment. Please note the following modifications:dose reduce of the oxliplation

## 2022-12-21 ENCOUNTER — Inpatient Hospital Stay: Payer: Medicare (Managed Care)

## 2022-12-21 ENCOUNTER — Other Ambulatory Visit: Payer: Self-pay

## 2022-12-21 VITALS — BP 151/85 | HR 75 | Temp 98.6°F | Resp 18

## 2022-12-21 DIAGNOSIS — C179 Malignant neoplasm of small intestine, unspecified: Secondary | ICD-10-CM

## 2022-12-21 DIAGNOSIS — Z5111 Encounter for antineoplastic chemotherapy: Secondary | ICD-10-CM | POA: Diagnosis not present

## 2022-12-21 MED ORDER — PEGFILGRASTIM INJECTION 6 MG/0.6ML ~~LOC~~
6.0000 mg | PREFILLED_SYRINGE | Freq: Once | SUBCUTANEOUS | Status: AC
Start: 2022-12-21 — End: 2022-12-21
  Administered 2022-12-21: 6 mg via SUBCUTANEOUS
  Filled 2022-12-21: qty 0.6

## 2022-12-21 MED ORDER — SODIUM CHLORIDE 0.9% FLUSH
10.0000 mL | INTRAVENOUS | Status: DC | PRN
  Administered 2022-12-21: 10 mL

## 2022-12-21 MED ORDER — HEPARIN SOD (PORK) LOCK FLUSH 100 UNIT/ML IV SOLN
500.0000 [IU] | Freq: Once | INTRAVENOUS | Status: AC | PRN
  Administered 2022-12-21: 500 [IU]

## 2022-12-21 NOTE — Patient Instructions (Signed)

## 2022-12-27 ENCOUNTER — Telehealth: Payer: Self-pay | Admitting: *Deleted

## 2022-12-27 NOTE — Telephone Encounter (Signed)
Son, Jillyn Hidden called requesting MD dictate letter to [employer] to get refund on tickets to [place]. Trip was reserved for 12/04-12/09 this year. Feels he needs to stay home due to his father's stage IV cancer and chemotherapy treatments and he assists in his care prn. States he needs letter as soon as possible.

## 2022-12-28 ENCOUNTER — Other Ambulatory Visit: Payer: Self-pay | Admitting: Oncology

## 2022-12-28 ENCOUNTER — Encounter: Payer: Self-pay | Admitting: Oncology

## 2022-12-28 NOTE — Telephone Encounter (Signed)
Notified Austin [name] that letter is ready to pick up. He is not able to get here due to living in [place]. Letter was due yesterday. Requested it be emailed to him at [email or web address]. Letter emailed and hardcopy at office to pick up when he is able.

## 2022-12-30 ENCOUNTER — Other Ambulatory Visit: Payer: Self-pay | Admitting: Oncology

## 2022-12-30 DIAGNOSIS — C179 Malignant neoplasm of small intestine, unspecified: Secondary | ICD-10-CM

## 2023-01-02 ENCOUNTER — Inpatient Hospital Stay: Payer: Medicare (Managed Care)

## 2023-01-02 ENCOUNTER — Inpatient Hospital Stay (HOSPITAL_BASED_OUTPATIENT_CLINIC_OR_DEPARTMENT_OTHER): Payer: Medicare (Managed Care) | Admitting: Oncology

## 2023-01-02 VITALS — BP 129/71 | HR 60 | Temp 98.1°F | Resp 18 | Ht 70.0 in | Wt 104.5 lb

## 2023-01-02 VITALS — BP 141/71 | HR 55

## 2023-01-02 DIAGNOSIS — C179 Malignant neoplasm of small intestine, unspecified: Secondary | ICD-10-CM

## 2023-01-02 DIAGNOSIS — Z5111 Encounter for antineoplastic chemotherapy: Secondary | ICD-10-CM | POA: Diagnosis not present

## 2023-01-02 LAB — CBC WITH DIFFERENTIAL (CANCER CENTER ONLY)
Abs Immature Granulocytes: 1.84 10*3/uL — ABNORMAL HIGH (ref 0.00–0.07)
Basophils Absolute: 0.2 10*3/uL — ABNORMAL HIGH (ref 0.0–0.1)
Basophils Relative: 1 %
Eosinophils Absolute: 0.1 10*3/uL (ref 0.0–0.5)
Eosinophils Relative: 0 %
HCT: 37.4 % — ABNORMAL LOW (ref 39.0–52.0)
Hemoglobin: 12.8 g/dL — ABNORMAL LOW (ref 13.0–17.0)
Immature Granulocytes: 9 %
Lymphocytes Relative: 8 %
Lymphs Abs: 1.7 10*3/uL (ref 0.7–4.0)
MCH: 33.4 pg (ref 26.0–34.0)
MCHC: 34.2 g/dL (ref 30.0–36.0)
MCV: 97.7 fL (ref 80.0–100.0)
Monocytes Absolute: 1.1 10*3/uL — ABNORMAL HIGH (ref 0.1–1.0)
Monocytes Relative: 6 %
Neutro Abs: 14.8 10*3/uL — ABNORMAL HIGH (ref 1.7–7.7)
Neutrophils Relative %: 76 %
Platelet Count: 167 10*3/uL (ref 150–400)
RBC: 3.83 MIL/uL — ABNORMAL LOW (ref 4.22–5.81)
RDW: 17.2 % — ABNORMAL HIGH (ref 11.5–15.5)
WBC Count: 19.7 10*3/uL — ABNORMAL HIGH (ref 4.0–10.5)
nRBC: 0 % (ref 0.0–0.2)

## 2023-01-02 LAB — CMP (CANCER CENTER ONLY)
ALT: 26 U/L (ref 0–44)
AST: 19 U/L (ref 15–41)
Albumin: 3.8 g/dL (ref 3.5–5.0)
Alkaline Phosphatase: 130 U/L — ABNORMAL HIGH (ref 38–126)
Anion gap: 7 (ref 5–15)
BUN: 17 mg/dL (ref 8–23)
CO2: 28 mmol/L (ref 22–32)
Calcium: 9.5 mg/dL (ref 8.9–10.3)
Chloride: 102 mmol/L (ref 98–111)
Creatinine: 0.78 mg/dL (ref 0.61–1.24)
GFR, Estimated: >60 mL/min (ref >60)
Glucose, Bld: 105 mg/dL — ABNORMAL HIGH (ref 70–99)
Potassium: 3.9 mmol/L (ref 3.5–5.1)
Sodium: 137 mmol/L (ref 135–145)
Total Bilirubin: 0.5 mg/dL (ref <1.2)
Total Protein: 6.2 g/dL — ABNORMAL LOW (ref 6.5–8.1)

## 2023-01-02 MED ORDER — DEXAMETHASONE SODIUM PHOSPHATE 10 MG/ML IJ SOLN
10.0000 mg | Freq: Once | INTRAMUSCULAR | Status: AC
  Administered 2023-01-02: 10 mg via INTRAVENOUS
  Filled 2023-01-02: qty 1

## 2023-01-02 MED ORDER — DEXTROSE 5 % IV SOLN
50.0000 mg/m2 | Freq: Once | INTRAVENOUS | Status: AC
  Administered 2023-01-02: 75 mg via INTRAVENOUS
  Filled 2023-01-02: qty 15

## 2023-01-02 MED ORDER — FLUOROURACIL CHEMO INJECTION 2.5 GM/50ML
400.0000 mg/m2 | Freq: Once | INTRAVENOUS | Status: AC
  Administered 2023-01-02: 600 mg via INTRAVENOUS
  Filled 2023-01-02: qty 12

## 2023-01-02 MED ORDER — SODIUM CHLORIDE 0.9 % IV SOLN
2400.0000 mg/m2 | INTRAVENOUS | Status: DC
  Administered 2023-01-02: 3500 mg via INTRAVENOUS
  Filled 2023-01-02: qty 70

## 2023-01-02 MED ORDER — PALONOSETRON HCL INJECTION 0.25 MG/5ML
0.2500 mg | Freq: Once | INTRAVENOUS | Status: AC
  Administered 2023-01-02: 0.25 mg via INTRAVENOUS
  Filled 2023-01-02: qty 5

## 2023-01-02 MED ORDER — LEUCOVORIN CALCIUM INJECTION 350 MG
400.0000 mg/m2 | Freq: Once | INTRAVENOUS | Status: AC
  Administered 2023-01-02: 612 mg via INTRAVENOUS
  Filled 2023-01-02: qty 30.6

## 2023-01-02 MED ORDER — DEXTROSE 5 % IV SOLN: Freq: Once | INTRAVENOUS | Status: AC

## 2023-01-02 NOTE — Progress Notes (Signed)
Patient seen by Dr. Thornton Papas today  Vitals are within treatment parameters:Yes   Labs are within treatment parameters: Yes   Treatment plan has been signed: Yes   Per physician team, Patient is ready for treatment and there are NO modifications to the treatment plan.

## 2023-01-02 NOTE — Progress Notes (Signed)
Per Dr. Truett Perna: decided to remove Neulasta from care plan this cycle since WBC is high.

## 2023-01-02 NOTE — Patient Instructions (Addendum)
[facility] - A DEPT OF [place]  The chemotherapy medication bag should finish at 46 hours, 96 hours, or 7 days. For example, if your pump is scheduled for 46 hours and it was put on at 4:00 p.m., it should finish at 2:00 p.m. the day it is scheduled to come off regardless of your appointment time.     Estimated time to finish at 12:00 Thursday, January 04, 2023.   If the display on your pump reads "Low Volume" and it is beeping, take the batteries out of the pump and come to the cancer center for it to be taken off.   If the pump alarms go off prior to the pump reading "Low Volume" then call [phone] and someone can assist you.  If the plunger comes out and the chemotherapy medication is leaking out, please use your home chemo spill kit to clean up the spill. Do NOT use paper towels or other household products.  If you have problems or questions regarding your pump, please call either [phone] (24 hours a day) or the cancer center Monday-Friday 8:00 a.m.- 4:30 p.m. at the clinic number and we will assist you. If you are unable to get assistance, then go to the nearest Emergency Department and ask the staff to contact the IV team for assistance.  Discharge Instructions: Thank you for choosing [facility] to provide your oncology and hematology care.   If you have a lab appointment with the Cancer Center, please go directly to the Cancer Center and check in at the registration area.   Wear comfortable clothing and clothing appropriate for easy access to any Portacath or PICC line.   We strive to give you quality time with your provider. You may need to reschedule your appointment if you arrive late (15 or more minutes).  Arriving late affects you and other patients whose appointments are after yours.  Also, if you miss three or more appointments without notifying the office, you may be dismissed from the clinic at the provider's  discretion.      For prescription refill requests, have your pharmacy contact our office and allow 72 hours for refills to be completed.    Today you received the following chemotherapy and/or immunotherapy agents Oxaliplatin, Leucovorin and Fluorouracil      To help prevent nausea and vomiting after your treatment, we encourage you to take your nausea medication as directed.  BELOW ARE SYMPTOMS THAT SHOULD BE REPORTED IMMEDIATELY: *FEVER GREATER THAN 100.4 F (38 C) OR HIGHER *CHILLS OR SWEATING *NAUSEA AND VOMITING THAT IS NOT CONTROLLED WITH YOUR NAUSEA MEDICATION *UNUSUAL SHORTNESS OF BREATH *UNUSUAL BRUISING OR BLEEDING *URINARY PROBLEMS (pain or burning when urinating, or frequent urination) *BOWEL PROBLEMS (unusual diarrhea, constipation, pain near the anus) TENDERNESS IN MOUTH AND THROAT WITH OR WITHOUT PRESENCE OF ULCERS (sore throat, sores in mouth, or a toothache) UNUSUAL RASH, SWELLING OR PAIN  UNUSUAL VAGINAL DISCHARGE OR ITCHING   Items with * indicate a potential emergency and should be followed up as soon as possible or go to the Emergency Department if any problems should occur.  Please show the CHEMOTHERAPY ALERT CARD or IMMUNOTHERAPY ALERT CARD at check-in to the Emergency Department and triage nurse.  Should you have questions after your visit or need to cancel or reschedule your appointment, please contact [facility] - A DEPT OF [facility]  Dept: [phone]  and follow the prompts.  Office  hours are 8:00 a.m. to 4:30 p.m. Monday - Friday. Please note that voicemails left after 4:00 p.m. may not be returned until the following business day.  We are closed weekends and major holidays. You have access to a nurse at all times for urgent questions. Please call the main number to the clinic Dept: [phone] and follow the prompts.   For any non-urgent questions, you may also contact your provider using MyChart. We now offer e-Visits  for anyone 71 and older to request care online for non-urgent symptoms. For details visit [email or web address].   Also download the MyChart app! Go to the app store, search "MyChart", open the app, select [place], and log in with your MyChart username and password.

## 2023-01-02 NOTE — Progress Notes (Signed)
[facility] OFFICE PROGRESS NOTE   Diagnosis: Small bowel carcinoma  INTERVAL HISTORY:   Mr. Broden returns as scheduled.  He completed another cycle of FOLFOX on 12/19/2022.  No nausea/vomiting, mouth sores, or neuropathy symptoms.  He has intermittent mild diarrhea.  No other complaint.  Objective:  Vital signs in last 24 hours:  Blood pressure 129/71, pulse 60, temperature 98.1 F (36.7 C), temperature source Temporal, resp. rate 18, height 5\' 10"  (1.778 m), weight 104 lb 8 oz (47.4 kg), SpO2 98%.    HEENT: No thrush or ulcers Resp: Distant breath sounds Cardio: Distant heart sounds GI: No hepatosplenomegaly, no apparent ascites Vascular: No leg edema Neuro: Moderate loss of vibratory sense at the fingertips bilaterally Skin: Palms without erythema  Portacath/PICC-without erythema  Lab Results:  Lab Results  Component Value Date   WBC 19.7 (H) 01/02/2023   HGB 12.8 (L) 01/02/2023   HCT 37.4 (L) 01/02/2023   MCV 97.7 01/02/2023   PLT 167 01/02/2023   NEUTROABS 14.8 (H) 01/02/2023    CMP  Lab Results  Component Value Date   NA 137 01/02/2023   K 3.9 01/02/2023   CL 102 01/02/2023   CO2 28 01/02/2023   GLUCOSE 105 (H) 01/02/2023   BUN 17 01/02/2023   CREATININE 0.78 01/02/2023   CALCIUM 9.5 01/02/2023   PROT 6.2 (L) 01/02/2023   ALBUMIN 3.8 01/02/2023   AST 19 01/02/2023   ALT 26 01/02/2023   ALKPHOS 130 (H) 01/02/2023   BILITOT 0.5 01/02/2023   GFRNONAA >60 01/02/2023   GFRAA  11/06/2008    >60        The eGFR has been calculated using the MDRD equation. This calculation has not been validated in all clinical situations. eGFR's persistently <60 mL/min signify possible Chronic Kidney Disease.    Lab Results  Component Value Date   CEA1 5.1 (H) 07/01/2022   CEA 4.83 09/25/2022    Medications: I have reviewed the patient's current medications.   Assessment/Plan: Small bowel carcinoma-stage IV (pT4,pN1,pM1 Admission  06/26/2022 with a small bowel obstruction CT abdomen/pelvis 06/26/2022-small bowel obstruction with transition in the lower abdomen/pelvis, irregular nodular densities in the right lower lobe CT angiogram abdomen/pelvis 06/27/2022-circumferential masslike thickening at the terminal ileum, progressive peritoneal soft tissue at the right paracolic gutter, increase in a posterior lateral left bladder mass, resolution of small bowel dilation, increased distal esophageal wall thickening compared to October 2023 06/30/2022-exploratory laparotomy, right hemicolectomy, and peritoneal biopsy-firm mass at the distal ileum with peritoneal implants-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, tumor perforates the visceral peritoneum and invades the cecum, negative resection margins, 2/13 nodes, 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  No evidence of progressive disease. Cycle 4 FOLFOX 11/30/2022, oxaliplatin dose reduced Cycle 5 FOLFOX 12/19/2022, oxaliplatin dose reduced due to neutropenia, Neulasta Cycle 6 FOLFOX 01/02/2023, Neulasta held secondary to neutrophilia   2.  COPD 3.  CAD 4.  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  Enlarging bladder mass on CT 06/27/2022 6.  Mother with cervical cancer 7.  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  He will complete another treatment with FOLFOX today.  He is tolerating the FOLFOX well.  The white count is elevated after receiving G-CSF 2 weeks ago.  G-CSF will be held today.  He will return for an office visit and chemotherapy in 2 weeks.  The plan is to complete a total of 8 cycles of FOLFOX prior to a restaging CT evaluation.  Thornton Papas, MD  01/02/2023  9:32 AM

## 2023-01-03 ENCOUNTER — Other Ambulatory Visit: Payer: Self-pay

## 2023-01-04 ENCOUNTER — Inpatient Hospital Stay: Payer: Medicare (Managed Care)

## 2023-01-04 VITALS — BP 121/68 | HR 68 | Temp 98.2°F | Resp 18

## 2023-01-04 DIAGNOSIS — Z5111 Encounter for antineoplastic chemotherapy: Secondary | ICD-10-CM | POA: Diagnosis not present

## 2023-01-04 DIAGNOSIS — C179 Malignant neoplasm of small intestine, unspecified: Secondary | ICD-10-CM

## 2023-01-04 MED ORDER — SODIUM CHLORIDE 0.9% FLUSH
10.0000 mL | INTRAVENOUS | Status: DC | PRN
  Administered 2023-01-04: 10 mL

## 2023-01-04 MED ORDER — HEPARIN SOD (PORK) LOCK FLUSH 100 UNIT/ML IV SOLN
500.0000 [IU] | Freq: Once | INTRAVENOUS | Status: AC | PRN
  Administered 2023-01-04: 500 [IU]

## 2023-01-04 NOTE — Patient Instructions (Signed)

## 2023-01-12 ENCOUNTER — Other Ambulatory Visit: Payer: Self-pay | Admitting: Oncology

## 2023-01-12 DIAGNOSIS — C179 Malignant neoplasm of small intestine, unspecified: Secondary | ICD-10-CM

## 2023-01-16 ENCOUNTER — Encounter: Payer: Self-pay | Admitting: Nurse Practitioner

## 2023-01-16 ENCOUNTER — Inpatient Hospital Stay: Payer: Medicare (Managed Care)

## 2023-01-16 ENCOUNTER — Inpatient Hospital Stay (HOSPITAL_BASED_OUTPATIENT_CLINIC_OR_DEPARTMENT_OTHER): Payer: Medicare (Managed Care) | Admitting: Nurse Practitioner

## 2023-01-16 ENCOUNTER — Inpatient Hospital Stay: Payer: Medicare (Managed Care) | Attending: Oncology

## 2023-01-16 VITALS — BP 142/70 | HR 74

## 2023-01-16 VITALS — BP 131/76 | HR 92 | Temp 98.2°F | Resp 18 | Ht 70.0 in | Wt 103.4 lb

## 2023-01-16 DIAGNOSIS — G620 Drug-induced polyneuropathy: Secondary | ICD-10-CM | POA: Diagnosis not present

## 2023-01-16 DIAGNOSIS — C172 Malignant neoplasm of ileum: Secondary | ICD-10-CM | POA: Diagnosis present

## 2023-01-16 DIAGNOSIS — I2510 Atherosclerotic heart disease of native coronary artery without angina pectoris: Secondary | ICD-10-CM | POA: Insufficient documentation

## 2023-01-16 DIAGNOSIS — C179 Malignant neoplasm of small intestine, unspecified: Secondary | ICD-10-CM | POA: Diagnosis not present

## 2023-01-16 DIAGNOSIS — Z5189 Encounter for other specified aftercare: Secondary | ICD-10-CM | POA: Insufficient documentation

## 2023-01-16 DIAGNOSIS — J449 Chronic obstructive pulmonary disease, unspecified: Secondary | ICD-10-CM | POA: Insufficient documentation

## 2023-01-16 DIAGNOSIS — Z85828 Personal history of other malignant neoplasm of skin: Secondary | ICD-10-CM | POA: Diagnosis not present

## 2023-01-16 DIAGNOSIS — Z5111 Encounter for antineoplastic chemotherapy: Secondary | ICD-10-CM | POA: Diagnosis present

## 2023-01-16 DIAGNOSIS — Z452 Encounter for adjustment and management of vascular access device: Secondary | ICD-10-CM | POA: Diagnosis not present

## 2023-01-16 LAB — CBC WITH DIFFERENTIAL (CANCER CENTER ONLY)
Abs Immature Granulocytes: 0.06 10*3/uL (ref 0.00–0.07)
Basophils Absolute: 0.1 10*3/uL (ref 0.0–0.1)
Basophils Relative: 1 %
Eosinophils Absolute: 0.2 10*3/uL (ref 0.0–0.5)
Eosinophils Relative: 4 %
HCT: 37.6 % — ABNORMAL LOW (ref 39.0–52.0)
Hemoglobin: 12.9 g/dL — ABNORMAL LOW (ref 13.0–17.0)
Immature Granulocytes: 1 %
Lymphocytes Relative: 26 %
Lymphs Abs: 1.5 10*3/uL (ref 0.7–4.0)
MCH: 34.1 pg — ABNORMAL HIGH (ref 26.0–34.0)
MCHC: 34.3 g/dL (ref 30.0–36.0)
MCV: 99.5 fL (ref 80.0–100.0)
Monocytes Absolute: 0.9 10*3/uL (ref 0.1–1.0)
Monocytes Relative: 16 %
Neutro Abs: 3.1 10*3/uL (ref 1.7–7.7)
Neutrophils Relative %: 52 %
Platelet Count: 199 10*3/uL (ref 150–400)
RBC: 3.78 MIL/uL — ABNORMAL LOW (ref 4.22–5.81)
RDW: 16.5 % — ABNORMAL HIGH (ref 11.5–15.5)
WBC Count: 5.8 10*3/uL (ref 4.0–10.5)
nRBC: 0 % (ref 0.0–0.2)

## 2023-01-16 LAB — CMP (CANCER CENTER ONLY)
ALT: 19 U/L (ref 0–44)
AST: 18 U/L (ref 15–41)
Albumin: 3.9 g/dL (ref 3.5–5.0)
Alkaline Phosphatase: 80 U/L (ref 38–126)
Anion gap: 9 (ref 5–15)
BUN: 20 mg/dL (ref 8–23)
CO2: 28 mmol/L (ref 22–32)
Calcium: 9.2 mg/dL (ref 8.9–10.3)
Chloride: 102 mmol/L (ref 98–111)
Creatinine: 0.78 mg/dL (ref 0.61–1.24)
GFR, Estimated: >60 mL/min (ref >60)
Glucose, Bld: 118 mg/dL — ABNORMAL HIGH (ref 70–99)
Potassium: 3.8 mmol/L (ref 3.5–5.1)
Sodium: 139 mmol/L (ref 135–145)
Total Bilirubin: 0.4 mg/dL (ref <1.2)
Total Protein: 6 g/dL — ABNORMAL LOW (ref 6.5–8.1)

## 2023-01-16 MED ORDER — PALONOSETRON HCL INJECTION 0.25 MG/5ML
0.2500 mg | Freq: Once | INTRAVENOUS | Status: AC
  Administered 2023-01-16: 0.25 mg via INTRAVENOUS
  Filled 2023-01-16: qty 5

## 2023-01-16 MED ORDER — DEXAMETHASONE SODIUM PHOSPHATE 10 MG/ML IJ SOLN
10.0000 mg | Freq: Once | INTRAMUSCULAR | Status: AC
  Administered 2023-01-16: 10 mg via INTRAVENOUS
  Filled 2023-01-16: qty 1

## 2023-01-16 MED ORDER — LEUCOVORIN CALCIUM INJECTION 350 MG
400.0000 mg/m2 | Freq: Once | INTRAVENOUS | Status: AC
  Administered 2023-01-16: 612 mg via INTRAVENOUS
  Filled 2023-01-16: qty 30.6

## 2023-01-16 MED ORDER — SODIUM CHLORIDE 0.9 % IV SOLN
2400.0000 mg/m2 | INTRAVENOUS | Status: DC
  Administered 2023-01-16: 3500 mg via INTRAVENOUS
  Filled 2023-01-16: qty 70

## 2023-01-16 MED ORDER — OXALIPLATIN CHEMO INJECTION 100 MG/20ML
50.0000 mg/m2 | Freq: Once | INTRAVENOUS | Status: AC
  Administered 2023-01-16: 75 mg via INTRAVENOUS
  Filled 2023-01-16: qty 15

## 2023-01-16 MED ORDER — FLUOROURACIL CHEMO INJECTION 2.5 GM/50ML
400.0000 mg/m2 | Freq: Once | INTRAVENOUS | Status: AC
  Administered 2023-01-16: 600 mg via INTRAVENOUS
  Filled 2023-01-16: qty 12

## 2023-01-16 MED ORDER — DEXTROSE 5 % IV SOLN: Freq: Once | INTRAVENOUS | Status: AC

## 2023-01-16 NOTE — Progress Notes (Signed)
[facility] OFFICE PROGRESS NOTE   Diagnosis: Small bowel carcinoma  INTERVAL HISTORY:   Austin [name] returns as scheduled.  He completed another cycle of FOLFOX 01/02/2023.  He had nausea for a few days.  No mouth sores.  No diarrhea.  Main complaint is fatigue for 1 to 2 days.  Cold sensitivity lasted 3 days.  No numbness or tingling in the absence of cold exposure.  He has an alteration in taste.  Objective:  Vital signs in last 24 hours:  Blood pressure 131/76, pulse 92, temperature 98.2 F (36.8 C), temperature source Temporal, resp. rate 18, height 5\' 10"  (1.778 m), weight 103 lb 6.4 oz (46.9 kg), SpO2 98%.    HEENT: No thrush or ulcers. Resp: Distant breath sounds.  No respiratory distress. Cardio: Distant heart sounds. GI: No hepatosplenomegaly. Vascular: No leg edema. Neuro: Vibratory sense mildly decreased over the fingertips per tuning fork exam. Skin: Palms have a dry appearance. Port-A-Cath without erythema.  Lab Results:  Lab Results  Component Value Date   WBC 5.8 01/16/2023   HGB 12.9 (L) 01/16/2023   HCT 37.6 (L) 01/16/2023   MCV 99.5 01/16/2023   PLT 199 01/16/2023   NEUTROABS 3.1 01/16/2023    Imaging:  No results found.  Medications: I have reviewed the patient's current medications.  Assessment/Plan: Small bowel carcinoma-stage IV (pT4,pN1,pM1 Admission 06/26/2022 with a small bowel obstruction CT abdomen/pelvis 06/26/2022-small bowel obstruction with transition in the lower abdomen/pelvis, irregular nodular densities in the right lower lobe CT angiogram abdomen/pelvis 06/27/2022-circumferential masslike thickening at the terminal ileum, progressive peritoneal soft tissue at the right paracolic gutter, increase in a posterior lateral left bladder mass, resolution of small bowel dilation, increased distal esophageal wall thickening compared to October 2023 06/30/2022-exploratory laparotomy, right hemicolectomy, and peritoneal biopsy-firm  mass at the distal ileum with peritoneal implants-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, tumor perforates the visceral peritoneum and invades the cecum, negative resection margins, 2/13 nodes, 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  No evidence of progressive disease. Cycle 4 FOLFOX 11/30/2022, oxaliplatin dose reduced Cycle 5 FOLFOX 12/19/2022, oxaliplatin dose reduced due to neutropenia, Neulasta Cycle 6 FOLFOX 01/02/2023, Neulasta held secondary to neutrophilia Cycle 7 FOLFOX 01/16/2023, Neulasta   2.  COPD 3.  CAD 4.  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  Enlarging bladder mass on CT 06/27/2022 6.  Mother with cervical cancer 7.  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  He has completed 6 cycles of FOLFOX.  Overall tolerating well, main complaint is fatigue for a few days after each treatment.  Plan to proceed with cycle 7 today as scheduled.  Restaging CTs after cycle 8.  CBC and chemistry panel reviewed.  Labs adequate to proceed as above.  He will receive Neulasta on the day of pump discontinuation.  He will return for follow-up and treatment in 2 weeks.  We are available to see him sooner if needed.  Lonna Cobb ANP/GNP-BC  01/16/2023  10:26 AM

## 2023-01-16 NOTE — Progress Notes (Signed)
Patient seen by Lonna Cobb NP today  Vitals are within treatment parameters:Yes   Labs are within treatment parameters: Yes   Treatment plan has been signed: Yes   Per physician team, Patient is ready for treatment and there are NO modifications to the treatment plan.

## 2023-01-16 NOTE — Patient Instructions (Addendum)
[facility] - A DEPT OF [facility]   The chemotherapy medication bag should finish at 46 hours, 96 hours, or 7 days. For example, if your pump is scheduled for 46 hours and it was put on at 4:00 p.m., it should finish at 2:00 p.m. the day it is scheduled to come off regardless of your appointment time.     Estimated time to finish at 12:15 Thursday, January 18, 2023.   If the display on your pump reads "Low Volume" and it is beeping, take the batteries out of the pump and come to the cancer center for it to be taken off.   If the pump alarms go off prior to the pump reading "Low Volume" then call [phone] and someone can assist you.  If the plunger comes out and the chemotherapy medication is leaking out, please use your home chemo spill kit to clean up the spill. Do NOT use paper towels or other household products.  If you have problems or questions regarding your pump, please call either [phone] (24 hours a day) or the cancer center Monday-Friday 8:00 a.m.- 4:30 p.m. at the clinic number and we will assist you. If you are unable to get assistance, then go to the nearest Emergency Department and ask the staff to contact the IV team for assistance.  Discharge Instructions: Thank you for choosing [facility] to provide your oncology and hematology care.   If you have a lab appointment with the Cancer Center, please go directly to the Cancer Center and check in at the registration area.   Wear comfortable clothing and clothing appropriate for easy access to any Portacath or PICC line.   We strive to give you quality time with your provider. You may need to reschedule your appointment if you arrive late (15 or more minutes).  Arriving late affects you and other patients whose appointments are after yours.  Also, if you miss three or more appointments without notifying the office, you may be dismissed from the clinic at the provider's  discretion.      For prescription refill requests, have your pharmacy contact our office and allow 72 hours for refills to be completed.    Today you received the following chemotherapy and/or immunotherapy agents Oxaliplatin, Leucovorin, Fluorouracil.      To help prevent nausea and vomiting after your treatment, we encourage you to take your nausea medication as directed.  BELOW ARE SYMPTOMS THAT SHOULD BE REPORTED IMMEDIATELY: *FEVER GREATER THAN 100.4 F (38 C) OR HIGHER *CHILLS OR SWEATING *NAUSEA AND VOMITING THAT IS NOT CONTROLLED WITH YOUR NAUSEA MEDICATION *UNUSUAL SHORTNESS OF BREATH *UNUSUAL BRUISING OR BLEEDING *URINARY PROBLEMS (pain or burning when urinating, or frequent urination) *BOWEL PROBLEMS (unusual diarrhea, constipation, pain near the anus) TENDERNESS IN MOUTH AND THROAT WITH OR WITHOUT PRESENCE OF ULCERS (sore throat, sores in mouth, or a toothache) UNUSUAL RASH, SWELLING OR PAIN  UNUSUAL VAGINAL DISCHARGE OR ITCHING   Items with * indicate a potential emergency and should be followed up as soon as possible or go to the Emergency Department if any problems should occur.  Please show the CHEMOTHERAPY ALERT CARD or IMMUNOTHERAPY ALERT CARD at check-in to the Emergency Department and triage nurse.  Should you have questions after your visit or need to cancel or reschedule your appointment, please contact [facility] - A DEPT OF [facility]  Dept: [phone]  and follow the prompts.  Office  hours are 8:00 a.m. to 4:30 p.m. Monday - Friday. Please note that voicemails left after 4:00 p.m. may not be returned until the following business day.  We are closed weekends and major holidays. You have access to a nurse at all times for urgent questions. Please call the main number to the clinic Dept: [phone] and follow the prompts.   For any non-urgent questions, you may also contact your provider using MyChart. We now offer e-Visits for  anyone 78 and older to request care online for non-urgent symptoms. For details visit [email or web address].   Also download the MyChart app! Go to the app store, search "MyChart", open the app, select [place], and log in with your MyChart username and password.  Oxaliplatin Injection What is this medication? OXALIPLATIN (ox AL i PLA tin) treats colorectal cancer. It works by slowing down the growth of cancer cells. This medicine may be used for other purposes; ask your health care provider or pharmacist if you have questions. COMMON BRAND NAME(S): Eloxatin What should I tell my care team before I take this medication? They need to know if you have any of these conditions: Heart disease History of irregular heartbeat or rhythm Liver disease Low blood cell levels (white cells, red cells, and platelets) Lung or breathing disease, such as asthma Take medications that treat or prevent blood clots Tingling of the fingers, toes, or other nerve disorder An unusual or allergic reaction to oxaliplatin, other medications, foods, dyes, or preservatives If you or your partner are pregnant or trying to get pregnant Breast-feeding How should I use this medication? This medication is injected into a vein. It is given by your care team in a hospital or clinic setting. Talk to your care team about the use of this medication in children. Special care may be needed. Overdosage: If you think you have taken too much of this medicine contact a poison control center or emergency room at once. NOTE: This medicine is only for you. Do not share this medicine with others. What if I miss a dose? Keep appointments for follow-up doses. It is important not to miss a dose. Call your care team if you are unable to keep an appointment. What may interact with this medication? Do not take this medication with any of the following: Cisapride Dronedarone Pimozide Thioridazine This medication may also interact with  the following: Aspirin and aspirin-like medications Certain medications that treat or prevent blood clots, such as warfarin, apixaban, dabigatran, and rivaroxaban Cisplatin Cyclosporine Diuretics Medications for infection, such as acyclovir, adefovir, amphotericin B, bacitracin, cidofovir, foscarnet, ganciclovir, gentamicin, pentamidine, vancomycin NSAIDs, medications for pain and inflammation, such as ibuprofen or naproxen Other medications that cause heart rhythm changes Pamidronate Zoledronic acid This list may not describe all possible interactions. Give your health care provider a list of all the medicines, herbs, non-prescription drugs, or dietary supplements you use. Also tell them if you smoke, drink alcohol, or use illegal drugs. Some items may interact with your medicine. What should I watch for while using this medication? Your condition will be monitored carefully while you are receiving this medication. You may need blood work while taking this medication. This medication may make you feel generally unwell. This is not uncommon as chemotherapy can affect healthy cells as well as cancer cells. Report any side effects. Continue your course of treatment even though you feel ill unless your care team tells you to stop. This medication may increase your risk of getting an infection. Call your care  team for advice if you get a fever, chills, sore throat, or other symptoms of a cold or flu. Do not treat yourself. Try to avoid being around people who are sick. Avoid taking medications that contain aspirin, acetaminophen, ibuprofen, naproxen, or ketoprofen unless instructed by your care team. These medications may hide a fever. Be careful brushing or flossing your teeth or using a toothpick because you may get an infection or bleed more easily. If you have any dental work done, tell your dentist you are receiving this medication. This medication can make you more sensitive to cold. Do not  drink cold drinks or use ice. Cover exposed skin before coming in contact with cold temperatures or cold objects. When out in cold weather wear warm clothing and cover your mouth and nose to warm the air that goes into your lungs. Tell your care team if you get sensitive to the cold. Talk to your care team if you or your partner are pregnant or think either of you might be pregnant. This medication can cause serious birth defects if taken during pregnancy and for 9 months after the last dose. A negative pregnancy test is required before starting this medication. A reliable form of contraception is recommended while taking this medication and for 9 months after the last dose. Talk to your care team about effective forms of contraception. Do not father a child while taking this medication and for 6 months after the last dose. Use a condom while having sex during this time period. Do not breastfeed while taking this medication and for 3 months after the last dose. This medication may cause infertility. Talk to your care team if you are concerned about your fertility. What side effects may I notice from receiving this medication? Side effects that you should report to your care team as soon as possible: Allergic reactions--skin rash, itching, hives, swelling of the face, lips, tongue, or throat Bleeding--bloody or black, tar-like stools, vomiting blood or brown material that looks like coffee grounds, red or dark brown urine, small red or purple spots on skin, unusual bruising or bleeding Dry cough, shortness of breath or trouble breathing Heart rhythm changes--fast or irregular heartbeat, dizziness, feeling faint or lightheaded, chest pain, trouble breathing Infection--fever, chills, cough, sore throat, wounds that don't heal, pain or trouble when passing urine, general feeling of Leucovorin Injection What is this medication? LEUCOVORIN (loo koe VOR in) prevents side effects from certain medications, such  as methotrexate. It works by increasing folate levels. This helps protect healthy cells in your body. It may also be used to treat anemia caused by low levels of folate. It can also be used with fluorouracil, a type of chemotherapy, to treat colorectal cancer. It works by increasing the effects of fluorouracil in the body. This medicine may be used for other purposes; ask your health care provider or pharmacist if you have questions. What should I tell my care team before I take this medication? They need to know if you have any of these conditions: Anemia from low levels of vitamin B12 in the blood An unusual or allergic reaction to leucovorin, folic acid, other medications, foods, dyes, or preservatives Pregnant or trying to get pregnant Breastfeeding How should I use this medication? This medication is injected into a vein or a muscle. It is given by your care team in a hospital or clinic setting. Talk to your care team about the use of this medication in children. Special care may be needed. Overdosage: If  you think you have taken too much of this medicine contact a poison control center or emergency room at once. NOTE: This medicine is only for you. Do not share this medicine with others. What if I miss a dose? Keep appointments for follow-up doses. It is important not to miss your dose. Call your care team if you are unable to keep an appointment. What may interact with this medication? Capecitabine Fluorouracil Phenobarbital Phenytoin Primidone Trimethoprim;sulfamethoxazole This list may not describe all possible interactions. Give your health care provider a list of all the medicines, herbs, non-prescription drugs, or dietary supplements you use. Also tell them if you smoke, drink alcohol, or use illegal drugs. Some items may interact with your medicine. What should I watch for while using this medication? Your condition will be monitored carefully while you are receiving this  medication. This medication may increase the side effects of 5-fluorouracil. Tell your care team if you have diarrhea or mouth sores that do not get better or that get worse. What side effects may I notice from receiving this medication? Side effects that you should report to your care team as soon as possible: Allergic reactions--skin rash, itching, hives, swelling of the face, lips, tongue, or throat This list may not describe all possible side effects. Call your doctor for medical advice about side effects. You may report side effects to FDA at 1-800-FDA-1088. Where should I keep my medication? This medication is given in a hospital or clinic. It will not be stored at home. NOTE: This sheet is a summary. It may not cover all possible information. If you have questions about this medicine, talk to your doctor, pharmacist, or health care provider.  2024 Elsevier/Gold Standard (2021-07-05 00:00:00) discomfort or being unwell Liver injury--right upper belly pain, loss of appetite, nausea, light-colored stool, dark yellow or brown urine, yellowing skin or eyes, unusual weakness or fatigue Low red blood cell level--unusual weakness or fatigue, dizziness, headache, trouble breathing Muscle injury--unusual weakness or fatigue, muscle pain, dark yellow or brown urine, decrease in amount of urine Pain, tingling, or numbness in the hands or feet Sudden and severe headache, confusion, change in vision, seizures, which may be signs of posterior reversible encephalopathy syndrome (PRES) Unusual bruising or bleeding Side effects that usually do not require medical attention (report to your care team if they continue or are bothersome): Diarrhea Nausea Pain, redness, or swelling with sores inside the mouth or throat Unusual weakness or fatigue Vomiting This list may not describe all possible side effects. Call your doctor for medical advice about side effects. You may report side effects to FDA at  1-800-FDA-1088. Where should I keep my medication? This medication is given in a hospital or clinic. It will not be stored at home. NOTE: This sheet is a summary. It may not cover all possible information. If you have questions about this medicine, talk to your doctor, pharmacist, or health care provider.  2024 Elsevier/Gold Standard (2021-11-15 00:00:00)  Fluorouracil Injection What is this medication? FLUOROURACIL (flure oh YOOR a sil) treats some types of cancer. It works by slowing down the growth of cancer cells. This medicine may be used for other purposes; ask your health care provider or pharmacist if you have questions. COMMON BRAND NAME(S): Adrucil What should I tell my care team before I take this medication? They need to know if you have any of these conditions: Blood disorders Dihydropyrimidine dehydrogenase (DPD) deficiency Infection, such as chickenpox, cold sores, herpes Kidney disease Liver disease Poor nutrition  Recent or ongoing radiation therapy An unusual or allergic reaction to fluorouracil, other medications, foods, dyes, or preservatives If you or your partner are pregnant or trying to get pregnant Breast-feeding How should I use this medication? This medication is injected into a vein. It is administered by your care team in a hospital or clinic setting. Talk to your care team about the use of this medication in children. Special care may be needed. Overdosage: If you think you have taken too much of this medicine contact a poison control center or emergency room at once. NOTE: This medicine is only for you. Do not share this medicine with others. What if I miss a dose? Keep appointments for follow-up doses. It is important not to miss your dose. Call your care team if you are unable to keep an appointment. What may interact with this medication? Do not take this medication with any of the following: Live virus vaccines This medication may also interact with  the following: Medications that treat or prevent blood clots, such as warfarin, enoxaparin, dalteparin This list may not describe all possible interactions. Give your health care provider a list of all the medicines, herbs, non-prescription drugs, or dietary supplements you use. Also tell them if you smoke, drink alcohol, or use illegal drugs. Some items may interact with your medicine. What should I watch for while using this medication? Your condition will be monitored carefully while you are receiving this medication. This medication may make you feel generally unwell. This is not uncommon as chemotherapy can affect healthy cells as well as cancer cells. Report any side effects. Continue your course of treatment even though you feel ill unless your care team tells you to stop. In some cases, you may be given additional medications to help with side effects. Follow all directions for their use. This medication may increase your risk of getting an infection. Call your care team for advice if you get a fever, chills, sore throat, or other symptoms of a cold or flu. Do not treat yourself. Try to avoid being around people who are sick. This medication may increase your risk to bruise or bleed. Call your care team if you notice any unusual bleeding. Be careful brushing or flossing your teeth or using a toothpick because you may get an infection or bleed more easily. If you have any dental work done, tell your dentist you are receiving this medication. Avoid taking medications that contain aspirin, acetaminophen, ibuprofen, naproxen, or ketoprofen unless instructed by your care team. These medications may hide a fever. Do not treat diarrhea with over the counter products. Contact your care team if you have diarrhea that lasts more than 2 days or if it is severe and watery. This medication can make you more sensitive to the sun. Keep out of the sun. If you cannot avoid being in the sun, wear protective  clothing and sunscreen. Do not use sun lamps, tanning beds, or tanning booths. Talk to your care team if you or your partner wish to become pregnant or think you might be pregnant. This medication can cause serious birth defects if taken during pregnancy and for 3 months after the last dose. A reliable form of contraception is recommended while taking this medication and for 3 months after the last dose. Talk to your care team about effective forms of contraception. Do not father a child while taking this medication and for 3 months after the last dose. Use a condom while having sex during this  time period. Do not breastfeed while taking this medication. This medication may cause infertility. Talk to your care team if you are concerned about your fertility. What side effects may I notice from receiving this medication? Side effects that you should report to your care team as soon as possible: Allergic reactions--skin rash, itching, hives, swelling of the face, lips, tongue, or throat Heart attack--pain or tightness in the chest, shoulders, arms, or jaw, nausea, shortness of breath, cold or clammy skin, feeling faint or lightheaded Heart failure--shortness of breath, swelling of the ankles, feet, or hands, sudden weight gain, unusual weakness or fatigue Heart rhythm changes--fast or irregular heartbeat, dizziness, feeling faint or lightheaded, chest pain, trouble breathing High ammonia level--unusual weakness or fatigue, confusion, loss of appetite, nausea, vomiting, seizures Infection--fever, chills, cough, sore throat, wounds that don't heal, pain or trouble when passing urine, general feeling of discomfort or being unwell Low red blood cell level--unusual weakness or fatigue, dizziness, headache, trouble breathing Pain, tingling, or numbness in the hands or feet, muscle weakness, change in vision, confusion or trouble speaking, loss of balance or coordination, trouble walking, seizures Redness,  swelling, and blistering of the skin over hands and feet Severe or prolonged diarrhea Unusual bruising or bleeding Side effects that usually do not require medical attention (report to your care team if they continue or are bothersome): Dry skin Headache Increased tears Nausea Pain, redness, or swelling with sores inside the mouth or throat Sensitivity to light Vomiting This list may not describe all possible side effects. Call your doctor for medical advice about side effects. You may report side effects to FDA at 1-800-FDA-1088. Where should I keep my medication? This medication is given in a hospital or clinic. It will not be stored at home. NOTE: This sheet is a summary. It may not cover all possible information. If you have questions about this medicine, talk to your doctor, pharmacist, or health care provider.  2024 Elsevier/Gold Standard (2021-06-07 00:00:00)

## 2023-01-17 ENCOUNTER — Other Ambulatory Visit: Payer: Self-pay

## 2023-01-18 ENCOUNTER — Inpatient Hospital Stay: Payer: Medicare (Managed Care)

## 2023-01-18 VITALS — BP 124/59 | HR 98 | Temp 98.5°F | Resp 18

## 2023-01-18 DIAGNOSIS — C179 Malignant neoplasm of small intestine, unspecified: Secondary | ICD-10-CM

## 2023-01-18 DIAGNOSIS — Z5111 Encounter for antineoplastic chemotherapy: Secondary | ICD-10-CM | POA: Diagnosis not present

## 2023-01-18 MED ORDER — SODIUM CHLORIDE 0.9% FLUSH
10.0000 mL | INTRAVENOUS | Status: DC | PRN
Start: 2023-01-18 — End: 2023-01-18
  Administered 2023-01-18: 10 mL

## 2023-01-18 MED ORDER — HEPARIN SOD (PORK) LOCK FLUSH 100 UNIT/ML IV SOLN
500.0000 [IU] | Freq: Once | INTRAVENOUS | Status: AC | PRN
  Administered 2023-01-18: 500 [IU]

## 2023-01-18 MED ORDER — PEGFILGRASTIM INJECTION 6 MG/0.6ML ~~LOC~~
6.0000 mg | PREFILLED_SYRINGE | Freq: Once | SUBCUTANEOUS | Status: AC
  Administered 2023-01-18: 6 mg via SUBCUTANEOUS
  Filled 2023-01-18: qty 0.6

## 2023-01-18 NOTE — Patient Instructions (Signed)

## 2023-01-27 ENCOUNTER — Other Ambulatory Visit: Payer: Self-pay | Admitting: Oncology

## 2023-01-30 ENCOUNTER — Inpatient Hospital Stay: Payer: Medicare (Managed Care)

## 2023-01-30 ENCOUNTER — Inpatient Hospital Stay (HOSPITAL_BASED_OUTPATIENT_CLINIC_OR_DEPARTMENT_OTHER): Payer: Medicare (Managed Care) | Admitting: Nurse Practitioner

## 2023-01-30 ENCOUNTER — Other Ambulatory Visit: Payer: Medicare (Managed Care)

## 2023-01-30 ENCOUNTER — Encounter: Payer: Self-pay | Admitting: Nurse Practitioner

## 2023-01-30 VITALS — BP 131/74 | HR 64 | Temp 98.3°F | Resp 18

## 2023-01-30 DIAGNOSIS — C179 Malignant neoplasm of small intestine, unspecified: Secondary | ICD-10-CM

## 2023-01-30 DIAGNOSIS — Z5111 Encounter for antineoplastic chemotherapy: Secondary | ICD-10-CM | POA: Diagnosis not present

## 2023-01-30 LAB — CBC WITH DIFFERENTIAL (CANCER CENTER ONLY)
Abs Immature Granulocytes: 0.88 10*3/uL — ABNORMAL HIGH (ref 0.00–0.07)
Basophils Absolute: 0.1 10*3/uL (ref 0.0–0.1)
Basophils Relative: 1 %
Eosinophils Absolute: 0.1 10*3/uL (ref 0.0–0.5)
Eosinophils Relative: 1 %
HCT: 39.6 % (ref 39.0–52.0)
Hemoglobin: 13.5 g/dL (ref 13.0–17.0)
Immature Granulocytes: 5 %
Lymphocytes Relative: 10 %
Lymphs Abs: 1.8 10*3/uL (ref 0.7–4.0)
MCH: 34.2 pg — ABNORMAL HIGH (ref 26.0–34.0)
MCHC: 34.1 g/dL (ref 30.0–36.0)
MCV: 100.3 fL — ABNORMAL HIGH (ref 80.0–100.0)
Monocytes Absolute: 1 10*3/uL (ref 0.1–1.0)
Monocytes Relative: 6 %
Neutro Abs: 14.4 10*3/uL — ABNORMAL HIGH (ref 1.7–7.7)
Neutrophils Relative %: 77 %
Platelet Count: 127 10*3/uL — ABNORMAL LOW (ref 150–400)
RBC: 3.95 MIL/uL — ABNORMAL LOW (ref 4.22–5.81)
RDW: 17.1 % — ABNORMAL HIGH (ref 11.5–15.5)
WBC Count: 18.3 10*3/uL — ABNORMAL HIGH (ref 4.0–10.5)
nRBC: 0 % (ref 0.0–0.2)

## 2023-01-30 LAB — CMP (CANCER CENTER ONLY)
ALT: 40 U/L (ref 0–44)
AST: 28 U/L (ref 15–41)
Albumin: 3.9 g/dL (ref 3.5–5.0)
Alkaline Phosphatase: 148 U/L — ABNORMAL HIGH (ref 38–126)
Anion gap: 9 (ref 5–15)
BUN: 23 mg/dL (ref 8–23)
CO2: 31 mmol/L (ref 22–32)
Calcium: 9.6 mg/dL (ref 8.9–10.3)
Chloride: 97 mmol/L — ABNORMAL LOW (ref 98–111)
Creatinine: 1.06 mg/dL (ref 0.61–1.24)
GFR, Estimated: >60 mL/min (ref >60)
Glucose, Bld: 110 mg/dL — ABNORMAL HIGH (ref 70–99)
Potassium: 3.8 mmol/L (ref 3.5–5.1)
Sodium: 137 mmol/L (ref 135–145)
Total Bilirubin: 0.6 mg/dL (ref <1.2)
Total Protein: 6.4 g/dL — ABNORMAL LOW (ref 6.5–8.1)

## 2023-01-30 MED ORDER — SODIUM CHLORIDE 0.9 % IV SOLN
2400.0000 mg/m2 | INTRAVENOUS | Status: DC
  Administered 2023-01-30: 3500 mg via INTRAVENOUS
  Filled 2023-01-30: qty 70

## 2023-01-30 MED ORDER — OXALIPLATIN CHEMO INJECTION 100 MG/20ML
50.0000 mg/m2 | Freq: Once | INTRAVENOUS | Status: AC
  Administered 2023-01-30: 75 mg via INTRAVENOUS
  Filled 2023-01-30: qty 15

## 2023-01-30 MED ORDER — PALONOSETRON HCL INJECTION 0.25 MG/5ML
0.2500 mg | Freq: Once | INTRAVENOUS | Status: AC
  Administered 2023-01-30: 0.25 mg via INTRAVENOUS
  Filled 2023-01-30: qty 5

## 2023-01-30 MED ORDER — DEXTROSE 5 % IV SOLN: Freq: Once | INTRAVENOUS | Status: AC

## 2023-01-30 MED ORDER — DEXTROSE 5 % IV SOLN
400.0000 mg/m2 | Freq: Once | INTRAVENOUS | Status: AC
  Administered 2023-01-30: 612 mg via INTRAVENOUS
  Filled 2023-01-30: qty 30.6

## 2023-01-30 MED ORDER — FLUOROURACIL CHEMO INJECTION 2.5 GM/50ML
400.0000 mg/m2 | Freq: Once | INTRAVENOUS | Status: AC
  Administered 2023-01-30: 600 mg via INTRAVENOUS
  Filled 2023-01-30: qty 12

## 2023-01-30 MED ORDER — DEXAMETHASONE SODIUM PHOSPHATE 10 MG/ML IJ SOLN
10.0000 mg | Freq: Once | INTRAMUSCULAR | Status: AC
  Administered 2023-01-30: 10 mg via INTRAVENOUS
  Filled 2023-01-30: qty 1

## 2023-01-30 MED ORDER — ONDANSETRON 8 MG PO TBDP: 8.0000 mg | ORAL_TABLET | Freq: Three times a day (TID) | ORAL | 3 refills | Status: DC | PRN

## 2023-01-30 NOTE — Patient Instructions (Addendum)
[facility] - A DEPT OF [facility]   The chemotherapy medication bag should finish at 46 hours, 96 hours, or 7 days. For example, if your pump is scheduled for 46 hours and it was put on at 4:00 p.m., it should finish at 2:00 p.m. the day it is scheduled to come off regardless of your appointment time.     Estimated time to finish at 12:15 Thursday, February 01, 2023.   If the display on your pump reads "Low Volume" and it is beeping, take the batteries out of the pump and come to the cancer center for it to be taken off.   If the pump alarms go off prior to the pump reading "Low Volume" then call [phone] and someone can assist you.  If the plunger comes out and the chemotherapy medication is leaking out, please use your home chemo spill kit to clean up the spill. Do NOT use paper towels or other household products.  If you have problems or questions regarding your pump, please call either [phone] (24 hours a day) or the cancer center Monday-Friday 8:00 a.m.- 4:30 p.m. at the clinic number and we will assist you. If you are unable to get assistance, then go to the nearest Emergency Department and ask the staff to contact the IV team for assistance.  Discharge Instructions: Thank you for choosing [facility] to provide your oncology and hematology care.   If you have a lab appointment with the Cancer Center, please go directly to the Cancer Center and check in at the registration area.   Wear comfortable clothing and clothing appropriate for easy access to any Portacath or PICC line.   We strive to give you quality time with your provider. You may need to reschedule your appointment if you arrive late (15 or more minutes).  Arriving late affects you and other patients whose appointments are after yours.  Also, if you miss three or more appointments without notifying the office, you may be dismissed from the clinic at the provider's  discretion.      For prescription refill requests, have your pharmacy contact our office and allow 72 hours for refills to be completed.    Today you received the following chemotherapy and/or immunotherapy agents Oxaliplatin, Leucovorin, Fluorouracil.      To help prevent nausea and vomiting after your treatment, we encourage you to take your nausea medication as directed.  BELOW ARE SYMPTOMS THAT SHOULD BE REPORTED IMMEDIATELY: *FEVER GREATER THAN 100.4 F (38 C) OR HIGHER *CHILLS OR SWEATING *NAUSEA AND VOMITING THAT IS NOT CONTROLLED WITH YOUR NAUSEA MEDICATION *UNUSUAL SHORTNESS OF BREATH *UNUSUAL BRUISING OR BLEEDING *URINARY PROBLEMS (pain or burning when urinating, or frequent urination) *BOWEL PROBLEMS (unusual diarrhea, constipation, pain near the anus) TENDERNESS IN MOUTH AND THROAT WITH OR WITHOUT PRESENCE OF ULCERS (sore throat, sores in mouth, or a toothache) UNUSUAL RASH, SWELLING OR PAIN  UNUSUAL VAGINAL DISCHARGE OR ITCHING   Items with * indicate a potential emergency and should be followed up as soon as possible or go to the Emergency Department if any problems should occur.  Please show the CHEMOTHERAPY ALERT CARD or IMMUNOTHERAPY ALERT CARD at check-in to the Emergency Department and triage nurse.  Should you have questions after your visit or need to cancel or reschedule your appointment, please contact [facility] - A DEPT OF [facility]  Dept: [phone]  and follow the prompts.  Office  hours are 8:00 a.m. to 4:30 p.m. Monday - Friday. Please note that voicemails left after 4:00 p.m. may not be returned until the following business day.  We are closed weekends and major holidays. You have access to a nurse at all times for urgent questions. Please call the main number to the clinic Dept: [phone] and follow the prompts.   For any non-urgent questions, you may also contact your provider using MyChart. We now offer e-Visits for  anyone 22 and older to request care online for non-urgent symptoms. For details visit [email or web address].   Also download the MyChart app! Go to the app store, search "MyChart", open the app, select [place], and log in with your MyChart username and password.  Oxaliplatin Injection What is this medication? OXALIPLATIN (ox AL i PLA tin) treats colorectal cancer. It works by slowing down the growth of cancer cells. This medicine may be used for other purposes; ask your health care provider or pharmacist if you have questions. COMMON BRAND NAME(S): Eloxatin What should I tell my care team before I take this medication? They need to know if you have any of these conditions: Heart disease History of irregular heartbeat or rhythm Liver disease Low blood cell levels (white cells, red cells, and platelets) Lung or breathing disease, such as asthma Take medications that treat or prevent blood clots Tingling of the fingers, toes, or other nerve disorder An unusual or allergic reaction to oxaliplatin, other medications, foods, dyes, or preservatives If you or your partner are pregnant or trying to get pregnant Breast-feeding How should I use this medication? This medication is injected into a vein. It is given by your care team in a hospital or clinic setting. Talk to your care team about the use of this medication in children. Special care may be needed. Overdosage: If you think you have taken too much of this medicine contact a poison control center or emergency room at once. NOTE: This medicine is only for you. Do not share this medicine with others. What if I miss a dose? Keep appointments for follow-up doses. It is important not to miss a dose. Call your care team if you are unable to keep an appointment. What may interact with this medication? Do not take this medication with any of the following: Cisapride Dronedarone Pimozide Thioridazine This medication may also interact with  the following: Aspirin and aspirin-like medications Certain medications that treat or prevent blood clots, such as warfarin, apixaban, dabigatran, and rivaroxaban Cisplatin Cyclosporine Diuretics Medications for infection, such as acyclovir, adefovir, amphotericin B, bacitracin, cidofovir, foscarnet, ganciclovir, gentamicin, pentamidine, vancomycin NSAIDs, medications for pain and inflammation, such as ibuprofen or naproxen Other medications that cause heart rhythm changes Pamidronate Zoledronic acid This list may not describe all possible interactions. Give your health care provider a list of all the medicines, herbs, non-prescription drugs, or dietary supplements you use. Also tell them if you smoke, drink alcohol, or use illegal drugs. Some items may interact with your medicine. What should I watch for while using this medication? Your condition will be monitored carefully while you are receiving this medication. You may need blood work while taking this medication. This medication may make you feel generally unwell. This is not uncommon as chemotherapy can affect healthy cells as well as cancer cells. Report any side effects. Continue your course of treatment even though you feel ill unless your care team tells you to stop. This medication may increase your risk of getting an infection. Call your care  team for advice if you get a fever, chills, sore throat, or other symptoms of a cold or flu. Do not treat yourself. Try to avoid being around people who are sick. Avoid taking medications that contain aspirin, acetaminophen, ibuprofen, naproxen, or ketoprofen unless instructed by your care team. These medications may hide a fever. Be careful brushing or flossing your teeth or using a toothpick because you may get an infection or bleed more easily. If you have any dental work done, tell your dentist you are receiving this medication. This medication can make you more sensitive to cold. Do not  drink cold drinks or use ice. Cover exposed skin before coming in contact with cold temperatures or cold objects. When out in cold weather wear warm clothing and cover your mouth and nose to warm the air that goes into your lungs. Tell your care team if you get sensitive to the cold. Talk to your care team if you or your partner are pregnant or think either of you might be pregnant. This medication can cause serious birth defects if taken during pregnancy and for 9 months after the last dose. A negative pregnancy test is required before starting this medication. A reliable form of contraception is recommended while taking this medication and for 9 months after the last dose. Talk to your care team about effective forms of contraception. Do not father a child while taking this medication and for 6 months after the last dose. Use a condom while having sex during this time period. Do not breastfeed while taking this medication and for 3 months after the last dose. This medication may cause infertility. Talk to your care team if you are concerned about your fertility. What side effects may I notice from receiving this medication? Side effects that you should report to your care team as soon as possible: Allergic reactions--skin rash, itching, hives, swelling of the face, lips, tongue, or throat Bleeding--bloody or black, tar-like stools, vomiting blood or brown material that looks like coffee grounds, red or dark brown urine, small red or purple spots on skin, unusual bruising or bleeding Dry cough, shortness of breath or trouble breathing Heart rhythm changes--fast or irregular heartbeat, dizziness, feeling faint or lightheaded, chest pain, trouble breathing Infection--fever, chills, cough, sore throat, wounds that don't heal, pain or trouble when passing urine, general feeling of Leucovorin Injection What is this medication? LEUCOVORIN (loo koe VOR in) prevents side effects from certain medications, such  as methotrexate. It works by increasing folate levels. This helps protect healthy cells in your body. It may also be used to treat anemia caused by low levels of folate. It can also be used with fluorouracil, a type of chemotherapy, to treat colorectal cancer. It works by increasing the effects of fluorouracil in the body. This medicine may be used for other purposes; ask your health care provider or pharmacist if you have questions. What should I tell my care team before I take this medication? They need to know if you have any of these conditions: Anemia from low levels of vitamin B12 in the blood An unusual or allergic reaction to leucovorin, folic acid, other medications, foods, dyes, or preservatives Pregnant or trying to get pregnant Breastfeeding How should I use this medication? This medication is injected into a vein or a muscle. It is given by your care team in a hospital or clinic setting. Talk to your care team about the use of this medication in children. Special care may be needed. Overdosage: If  you think you have taken too much of this medicine contact a poison control center or emergency room at once. NOTE: This medicine is only for you. Do not share this medicine with others. What if I miss a dose? Keep appointments for follow-up doses. It is important not to miss your dose. Call your care team if you are unable to keep an appointment. What may interact with this medication? Capecitabine Fluorouracil Phenobarbital Phenytoin Primidone Trimethoprim;sulfamethoxazole This list may not describe all possible interactions. Give your health care provider a list of all the medicines, herbs, non-prescription drugs, or dietary supplements you use. Also tell them if you smoke, drink alcohol, or use illegal drugs. Some items may interact with your medicine. What should I watch for while using this medication? Your condition will be monitored carefully while you are receiving this  medication. This medication may increase the side effects of 5-fluorouracil. Tell your care team if you have diarrhea or mouth sores that do not get better or that get worse. What side effects may I notice from receiving this medication? Side effects that you should report to your care team as soon as possible: Allergic reactions--skin rash, itching, hives, swelling of the face, lips, tongue, or throat This list may not describe all possible side effects. Call your doctor for medical advice about side effects. You may report side effects to FDA at 1-800-FDA-1088. Where should I keep my medication? This medication is given in a hospital or clinic. It will not be stored at home. NOTE: This sheet is a summary. It may not cover all possible information. If you have questions about this medicine, talk to your doctor, pharmacist, or health care provider.  2024 Elsevier/Gold Standard (2021-07-05 00:00:00) discomfort or being unwell Liver injury--right upper belly pain, loss of appetite, nausea, light-colored stool, dark yellow or brown urine, yellowing skin or eyes, unusual weakness or fatigue Low red blood cell level--unusual weakness or fatigue, dizziness, headache, trouble breathing Muscle injury--unusual weakness or fatigue, muscle pain, dark yellow or brown urine, decrease in amount of urine Pain, tingling, or numbness in the hands or feet Sudden and severe headache, confusion, change in vision, seizures, which may be signs of posterior reversible encephalopathy syndrome (PRES) Unusual bruising or bleeding Side effects that usually do not require medical attention (report to your care team if they continue or are bothersome): Diarrhea Nausea Pain, redness, or swelling with sores inside the mouth or throat Unusual weakness or fatigue Vomiting This list may not describe all possible side effects. Call your doctor for medical advice about side effects. You may report side effects to FDA at  1-800-FDA-1088. Where should I keep my medication? This medication is given in a hospital or clinic. It will not be stored at home. NOTE: This sheet is a summary. It may not cover all possible information. If you have questions about this medicine, talk to your doctor, pharmacist, or health care provider.  2024 Elsevier/Gold Standard (2021-11-15 00:00:00)  Fluorouracil Injection What is this medication? FLUOROURACIL (flure oh YOOR a sil) treats some types of cancer. It works by slowing down the growth of cancer cells. This medicine may be used for other purposes; ask your health care provider or pharmacist if you have questions. COMMON BRAND NAME(S): Adrucil What should I tell my care team before I take this medication? They need to know if you have any of these conditions: Blood disorders Dihydropyrimidine dehydrogenase (DPD) deficiency Infection, such as chickenpox, cold sores, herpes Kidney disease Liver disease Poor nutrition  Recent or ongoing radiation therapy An unusual or allergic reaction to fluorouracil, other medications, foods, dyes, or preservatives If you or your partner are pregnant or trying to get pregnant Breast-feeding How should I use this medication? This medication is injected into a vein. It is administered by your care team in a hospital or clinic setting. Talk to your care team about the use of this medication in children. Special care may be needed. Overdosage: If you think you have taken too much of this medicine contact a poison control center or emergency room at once. NOTE: This medicine is only for you. Do not share this medicine with others. What if I miss a dose? Keep appointments for follow-up doses. It is important not to miss your dose. Call your care team if you are unable to keep an appointment. What may interact with this medication? Do not take this medication with any of the following: Live virus vaccines This medication may also interact with  the following: Medications that treat or prevent blood clots, such as warfarin, enoxaparin, dalteparin This list may not describe all possible interactions. Give your health care provider a list of all the medicines, herbs, non-prescription drugs, or dietary supplements you use. Also tell them if you smoke, drink alcohol, or use illegal drugs. Some items may interact with your medicine. What should I watch for while using this medication? Your condition will be monitored carefully while you are receiving this medication. This medication may make you feel generally unwell. This is not uncommon as chemotherapy can affect healthy cells as well as cancer cells. Report any side effects. Continue your course of treatment even though you feel ill unless your care team tells you to stop. In some cases, you may be given additional medications to help with side effects. Follow all directions for their use. This medication may increase your risk of getting an infection. Call your care team for advice if you get a fever, chills, sore throat, or other symptoms of a cold or flu. Do not treat yourself. Try to avoid being around people who are sick. This medication may increase your risk to bruise or bleed. Call your care team if you notice any unusual bleeding. Be careful brushing or flossing your teeth or using a toothpick because you may get an infection or bleed more easily. If you have any dental work done, tell your dentist you are receiving this medication. Avoid taking medications that contain aspirin, acetaminophen, ibuprofen, naproxen, or ketoprofen unless instructed by your care team. These medications may hide a fever. Do not treat diarrhea with over the counter products. Contact your care team if you have diarrhea that lasts more than 2 days or if it is severe and watery. This medication can make you more sensitive to the sun. Keep out of the sun. If you cannot avoid being in the sun, wear protective  clothing and sunscreen. Do not use sun lamps, tanning beds, or tanning booths. Talk to your care team if you or your partner wish to become pregnant or think you might be pregnant. This medication can cause serious birth defects if taken during pregnancy and for 3 months after the last dose. A reliable form of contraception is recommended while taking this medication and for 3 months after the last dose. Talk to your care team about effective forms of contraception. Do not father a child while taking this medication and for 3 months after the last dose. Use a condom while having sex during this  time period. Do not breastfeed while taking this medication. This medication may cause infertility. Talk to your care team if you are concerned about your fertility. What side effects may I notice from receiving this medication? Side effects that you should report to your care team as soon as possible: Allergic reactions--skin rash, itching, hives, swelling of the face, lips, tongue, or throat Heart attack--pain or tightness in the chest, shoulders, arms, or jaw, nausea, shortness of breath, cold or clammy skin, feeling faint or lightheaded Heart failure--shortness of breath, swelling of the ankles, feet, or hands, sudden weight gain, unusual weakness or fatigue Heart rhythm changes--fast or irregular heartbeat, dizziness, feeling faint or lightheaded, chest pain, trouble breathing High ammonia level--unusual weakness or fatigue, confusion, loss of appetite, nausea, vomiting, seizures Infection--fever, chills, cough, sore throat, wounds that don't heal, pain or trouble when passing urine, general feeling of discomfort or being unwell Low red blood cell level--unusual weakness or fatigue, dizziness, headache, trouble breathing Pain, tingling, or numbness in the hands or feet, muscle weakness, change in vision, confusion or trouble speaking, loss of balance or coordination, trouble walking, seizures Redness,  swelling, and blistering of the skin over hands and feet Severe or prolonged diarrhea Unusual bruising or bleeding Side effects that usually do not require medical attention (report to your care team if they continue or are bothersome): Dry skin Headache Increased tears Nausea Pain, redness, or swelling with sores inside the mouth or throat Sensitivity to light Vomiting This list may not describe all possible side effects. Call your doctor for medical advice about side effects. You may report side effects to FDA at 1-800-FDA-1088. Where should I keep my medication? This medication is given in a hospital or clinic. It will not be stored at home. NOTE: This sheet is a summary. It may not cover all possible information. If you have questions about this medicine, talk to your doctor, pharmacist, or health care provider.  2024 Elsevier/Gold Standard (2021-06-07 00:00:00)

## 2023-01-30 NOTE — Progress Notes (Signed)
[facility] OFFICE PROGRESS NOTE   Diagnosis: Small bowel carcinoma  INTERVAL HISTORY:   Austin [name] returns as scheduled.  He completed cycle 7 FOLFOX 01/16/2023.  He denies nausea/vomiting.  No mouth sores.  He developed 2 small sores in his nose, resolved within 3 days.  No diarrhea.  Cold sensitivity lasted about 3 days.  No persistent neuropathy symptoms.  He denies pain.  He has a good appetite.  Objective:  Vital signs in last 24 hours:  Blood pressure 117/64, pulse 79, temperature (!) 97.5 F (36.4 C), temperature source Oral, resp. rate 16, height 5\' 10"  (1.778 m), weight 98 lb 6.4 oz (44.6 kg), SpO2 97%.    HEENT: No thrush or ulcers.  No apparent ulcers involving nasal passages. Resp: Distant breath sounds.  No respiratory distress. Cardio: Distant heart sounds. GI: No hepatosplenomegaly. Vascular: No leg edema. Neuro: Vibratory sense mildly decreased over the fingertips per tuning fork exam. Skin: Palms dry appearing. Port-A-Cath without erythema.  Lab Results:  Lab Results  Component Value Date   WBC 18.3 (H) 01/30/2023   HGB 13.5 01/30/2023   HCT 39.6 01/30/2023   MCV 100.3 (H) 01/30/2023   PLT 127 (L) 01/30/2023   NEUTROABS 14.4 (H) 01/30/2023    Imaging:  No results found.  Medications: I have reviewed the patient's current medications.  Assessment/Plan: Small bowel carcinoma-stage IV (pT4,pN1,pM1 Admission 06/26/2022 with a small bowel obstruction CT abdomen/pelvis 06/26/2022-small bowel obstruction with transition in the lower abdomen/pelvis, irregular nodular densities in the right lower lobe CT angiogram abdomen/pelvis 06/27/2022-circumferential masslike thickening at the terminal ileum, progressive peritoneal soft tissue at the right paracolic gutter, increase in a posterior lateral left bladder mass, resolution of small bowel dilation, increased distal esophageal wall thickening compared to October 2023 06/30/2022-exploratory  laparotomy, right hemicolectomy, and peritoneal biopsy-firm mass at the distal ileum with peritoneal implants-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, tumor perforates the visceral peritoneum and invades the cecum, negative resection margins, 2/13 nodes, 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  No evidence of progressive disease. Cycle 4 FOLFOX 11/30/2022, oxaliplatin dose reduced Cycle 5 FOLFOX 12/19/2022, oxaliplatin dose reduced due to neutropenia, Neulasta Cycle 6 FOLFOX 01/02/2023, Neulasta held secondary to neutrophilia Cycle 7 FOLFOX 01/16/2023, Neulasta Cycle 8 FOLFOX 01/30/2023, Neulasta held secondary to neutrophilia   2.  COPD 3.  CAD 4.  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  Enlarging bladder mass on CT 06/27/2022 6.  Mother with cervical cancer 7.  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  He has completed 7 cycles of FOLFOX.  He is tolerating treatment well.  There is no clinical evidence of disease progression.  Plan to proceed with cycle 8 today as scheduled.  Restaging CTs prior to next office visit.  He understands the "sores" in his nose may have been related to chemotherapy.  CBC reviewed.  Counts adequate to proceed as above.   Chemistry panel is pending.  He will return for  follow-up as scheduled in 2 weeks.  He will contact the office in the interim with any problems.    Lonna Cobb ANP/GNP-BC   01/30/2023  9:56 AM

## 2023-01-30 NOTE — Progress Notes (Signed)
Patient seen by Lonna Cobb NP today  Vitals are within treatment parameters:Yes   Labs are within treatment parameters: Yes   Treatment plan has been signed: Yes   Per physician team, Patient is ready for treatment and there are NO modifications to the treatment plan.

## 2023-01-30 NOTE — Patient Instructions (Signed)

## 2023-02-01 ENCOUNTER — Inpatient Hospital Stay: Payer: Medicare (Managed Care)

## 2023-02-01 VITALS — BP 117/59 | HR 78 | Temp 98.2°F | Resp 20

## 2023-02-01 DIAGNOSIS — Z5111 Encounter for antineoplastic chemotherapy: Secondary | ICD-10-CM | POA: Diagnosis not present

## 2023-02-01 DIAGNOSIS — C179 Malignant neoplasm of small intestine, unspecified: Secondary | ICD-10-CM

## 2023-02-01 MED ORDER — HEPARIN SOD (PORK) LOCK FLUSH 100 UNIT/ML IV SOLN
500.0000 [IU] | Freq: Once | INTRAVENOUS | Status: AC | PRN
Start: 2023-02-01 — End: 2023-02-01
  Administered 2023-02-01: 500 [IU]

## 2023-02-01 MED ORDER — SODIUM CHLORIDE 0.9% FLUSH
10.0000 mL | INTRAVENOUS | Status: DC | PRN
  Administered 2023-02-01: 10 mL

## 2023-02-01 NOTE — Patient Instructions (Signed)

## 2023-02-08 ENCOUNTER — Ambulatory Visit (HOSPITAL_COMMUNITY): Payer: Medicare (Managed Care)

## 2023-02-10 ENCOUNTER — Other Ambulatory Visit: Payer: Self-pay | Admitting: Oncology

## 2023-02-10 DIAGNOSIS — C179 Malignant neoplasm of small intestine, unspecified: Secondary | ICD-10-CM

## 2023-02-12 ENCOUNTER — Ambulatory Visit (HOSPITAL_BASED_OUTPATIENT_CLINIC_OR_DEPARTMENT_OTHER)
Admission: RE | Admit: 2023-02-12 | Discharge: 2023-02-12 | Disposition: A | Discharge status: Home / Self Care | Payer: Medicare (Managed Care) | Source: Ambulatory Visit | Attending: Nurse Practitioner | Admitting: Nurse Practitioner

## 2023-02-12 DIAGNOSIS — C179 Malignant neoplasm of small intestine, unspecified: Secondary | ICD-10-CM | POA: Diagnosis present

## 2023-02-12 MED ORDER — IOHEXOL 300 MG/ML  SOLN
80.0000 mL | Freq: Once | INTRAMUSCULAR | Status: AC | PRN
  Administered 2023-02-12: 70 mL via INTRAVENOUS

## 2023-02-13 ENCOUNTER — Inpatient Hospital Stay: Payer: Medicare (Managed Care)

## 2023-02-13 ENCOUNTER — Inpatient Hospital Stay (HOSPITAL_BASED_OUTPATIENT_CLINIC_OR_DEPARTMENT_OTHER): Payer: Medicare (Managed Care) | Admitting: Oncology

## 2023-02-13 VITALS — BP 116/65 | HR 83 | Temp 98.1°F | Resp 18 | Ht 70.0 in | Wt 102.8 lb

## 2023-02-13 DIAGNOSIS — C179 Malignant neoplasm of small intestine, unspecified: Secondary | ICD-10-CM

## 2023-02-13 DIAGNOSIS — Z95828 Presence of other vascular implants and grafts: Secondary | ICD-10-CM

## 2023-02-13 DIAGNOSIS — Z5111 Encounter for antineoplastic chemotherapy: Secondary | ICD-10-CM | POA: Diagnosis not present

## 2023-02-13 LAB — CBC WITH DIFFERENTIAL (CANCER CENTER ONLY)
Abs Immature Granulocytes: 0.01 10*3/uL (ref 0.00–0.07)
Basophils Absolute: 0 10*3/uL (ref 0.0–0.1)
Basophils Relative: 1 %
Eosinophils Absolute: 0.2 10*3/uL (ref 0.0–0.5)
Eosinophils Relative: 4 %
HCT: 35.6 % — ABNORMAL LOW (ref 39.0–52.0)
Hemoglobin: 12.3 g/dL — ABNORMAL LOW (ref 13.0–17.0)
Immature Granulocytes: 0 %
Lymphocytes Relative: 32 %
Lymphs Abs: 1.3 10*3/uL (ref 0.7–4.0)
MCH: 34.7 pg — ABNORMAL HIGH (ref 26.0–34.0)
MCHC: 34.6 g/dL (ref 30.0–36.0)
MCV: 100.6 fL — ABNORMAL HIGH (ref 80.0–100.0)
Monocytes Absolute: 0.4 10*3/uL (ref 0.1–1.0)
Monocytes Relative: 10 %
Neutro Abs: 2.2 10*3/uL (ref 1.7–7.7)
Neutrophils Relative %: 53 %
Platelet Count: 134 10*3/uL — ABNORMAL LOW (ref 150–400)
RBC: 3.54 MIL/uL — ABNORMAL LOW (ref 4.22–5.81)
RDW: 16.1 % — ABNORMAL HIGH (ref 11.5–15.5)
WBC Count: 4.1 10*3/uL (ref 4.0–10.5)
nRBC: 0 % (ref 0.0–0.2)

## 2023-02-13 LAB — CMP (CANCER CENTER ONLY)
ALT: 34 U/L (ref 0–44)
AST: 25 U/L (ref 15–41)
Albumin: 3.7 g/dL (ref 3.5–5.0)
Alkaline Phosphatase: 100 U/L (ref 38–126)
Anion gap: 9 (ref 5–15)
BUN: 15 mg/dL (ref 8–23)
CO2: 26 mmol/L (ref 22–32)
Calcium: 8.9 mg/dL (ref 8.9–10.3)
Chloride: 103 mmol/L (ref 98–111)
Creatinine: 0.8 mg/dL (ref 0.61–1.24)
GFR, Estimated: >60 mL/min (ref >60)
Glucose, Bld: 115 mg/dL — ABNORMAL HIGH (ref 70–99)
Potassium: 3.4 mmol/L — ABNORMAL LOW (ref 3.5–5.1)
Sodium: 138 mmol/L (ref 135–145)
Total Bilirubin: 0.5 mg/dL (ref 0.0–1.2)
Total Protein: 5.7 g/dL — ABNORMAL LOW (ref 6.5–8.1)

## 2023-02-13 LAB — CEA (ACCESS): CEA (CHCC): 6.15 ng/mL — ABNORMAL HIGH (ref 0.00–5.00)

## 2023-02-13 MED ORDER — SODIUM CHLORIDE 0.9% FLUSH
10.0000 mL | INTRAVENOUS | Status: DC | PRN
  Administered 2023-02-13: 10 mL via INTRAVENOUS

## 2023-02-13 MED ORDER — HEPARIN SOD (PORK) LOCK FLUSH 100 UNIT/ML IV SOLN
500.0000 [IU] | Freq: Once | INTRAVENOUS | Status: AC
  Administered 2023-02-13: 500 [IU] via INTRAVENOUS

## 2023-02-13 NOTE — Progress Notes (Signed)
 [facility] OFFICE PROGRESS NOTE   Diagnosis: Small bowel carcinoma  INTERVAL HISTORY:   Mr. Vantine completed another cycle of FOLFOX on 01/30/2023.  He reports persistent cold sensitivity.  No peripheral numbness.  He has altered taste.  No other complaint.  Here today with his wife and son.  Objective:  Vital signs in last 24 hours:  Blood pressure 116/65, pulse 83, temperature 98.1 F (36.7 C), temperature source Temporal, resp. rate 18, height 5' 10 (1.778 m), weight 102 lb 12.8 oz (46.6 kg), SpO2 98%.    HEENT: No thrush or ulcers Lymphatics: No cervical, supraclavicular, axillary, or inguinal nodes Resp: Distant breath sounds, no respiratory distress Cardio: Rate and rhythm GI: No mass, nontender, no hepatosplenomegaly, no apparent ascites Vascular: No leg edema  Portacath/PICC-without erythema  Lab Results:  Lab Results  Component Value Date   WBC 4.1 02/13/2023   HGB 12.3 (L) 02/13/2023   HCT 35.6 (L) 02/13/2023   MCV 100.6 (H) 02/13/2023   PLT 134 (L) 02/13/2023   NEUTROABS 2.2 02/13/2023    CMP  Lab Results  Component Value Date   NA 138 02/13/2023   K 3.4 (L) 02/13/2023   CL 103 02/13/2023   CO2 26 02/13/2023   GLUCOSE 115 (H) 02/13/2023   BUN 15 02/13/2023   CREATININE 0.80 02/13/2023   CALCIUM  8.9 02/13/2023   PROT 5.7 (L) 02/13/2023   ALBUMIN  3.7 02/13/2023   AST 25 02/13/2023   ALT 34 02/13/2023   ALKPHOS 100 02/13/2023   BILITOT 0.5 02/13/2023   GFRNONAA >60 02/13/2023   GFRAA  11/06/2008    >60        The eGFR has been calculated using the MDRD equation. This calculation has not been validated in all clinical situations. eGFR's persistently <60 mL/min signify possible Chronic Kidney Disease.    Lab Results  Component Value Date   CEA1 5.1 (H) 07/01/2022   CEA 6.15 (H) 02/13/2023    Lab Results  Component Value Date   INR 1.1 11/17/2021   LABPROT 13.8 11/17/2021    Imaging:  CT CHEST ABDOMEN PELVIS W  CONTRAST Result Date: 02/12/2023 CLINICAL DATA:  Small bowel cancer follow-up, assess treatment response. * Tracking Code: BO * EXAM: CT CHEST, ABDOMEN, AND PELVIS WITH CONTRAST TECHNIQUE: Multidetector CT imaging of the chest, abdomen and pelvis was performed following the standard protocol during bolus administration of intravenous contrast. RADIATION DOSE REDUCTION: This exam was performed according to the departmental dose-optimization program which includes automated exposure control, adjustment of the mA and/or kV according to patient size and/or use of iterative reconstruction technique. CONTRAST:  70mL OMNIPAQUE  IOHEXOL  300 MG/ML  SOLN COMPARISON:  Multiple priors including most recent CT November 27, 2022. FINDINGS: CT CHEST FINDINGS Cardiovascular: Accessed right chest Port-A-Cath with tip in the right atrium. Aortic and branch vessel atherosclerosis. No central pulmonary embolus on this nondedicated study. Three-vessel coronary artery calcifications/stents. Normal size heart. No significant pericardial effusion/thickening. Mediastinum/Nodes: No suspicious thyroid nodule. No pathologically enlarged mediastinal, hilar or axillary lymph nodes. The esophagus is grossly unremarkable. Lungs/Pleura: Stable calcified noncalcified scattered pulmonary nodules for instance in the left upper lobe measuring 5 mm on image 26/4. No new suspicious pulmonary nodules or masses. Biapical pleuroparenchymal scarring. Severe emphysematous change. Musculoskeletal: No aggressive lytic or blastic lesion of bone. Thoracic kyphosis. Paucity of subcutaneous fat with a diminutive musculature. CT ABDOMEN PELVIS FINDINGS Hepatobiliary: Small cyst in the inferior right lobe of the liver on image 64/2. Stable hemangioma in the  left lobe of the liver measuring 6 mm on image 64/2. No new suspicious hepatic lesion. Gallbladder is unremarkable.  No biliary ductal dilation. Pancreas: No pancreatic ductal dilation or evidence of acute  inflammation. Spleen: No splenomegaly. Adrenals/Urinary Tract: No suspicious adrenal nodule/mass. No hydronephrosis. Symmetric renal enhancement. Stable bilateral renal cysts. Similar calcific densities in the urinary bladder. Stomach/Bowel: Radiopaque enteric contrast material traverses the C-loop of the duodenum. Bowel assessment is limited by paucity of intra-abdominal fat. Stomach is unremarkable for degree of distension. No pathologic dilation of small or large bowel. Prior right hemicolectomy with ileocolic anastomosis. Colorectal wall thickening is similar prior. Colonic diverticulosis. Vascular/Lymphatic: Aortic atherosclerosis. Normal caliber abdominal aorta. The portal, splenic and superior mesenteric veins are patent. No pathologically enlarged abdominal or pelvic lymph nodes. Reproductive: Stable heterogeneity and mild enlargement of prostate gland. Other: Similar cachectic appearance with paucity of intra-abdominal fat. Diffuse omental thickening and trace ascites is increased from prior examination for instance in the left hemiabdomen on image 84/2 and anterior midline abdomen on image 87/2. Musculoskeletal: No aggressive lytic or blastic lesion of bone. Multilevel degenerative change of the spine. IMPRESSION: 1. Slightly increased diffuse nodular omental thickening with trace ascites compatible with worsening peritoneal carcinomatosis. 2. Prior right hemicolectomy with ileocolic anastomosis. Similar nonspecific colorectal wall thickening. 3. Stable calcified and noncalcified scattered pulmonary nodules, favored benign given stability. 4. Similar calcific densities in the urinary bladder. 5. Stable heterogeneity and mild enlargement of prostate gland. 6. Aortic Atherosclerosis (ICD10-I70.0) and Emphysema (ICD10-J43.9). Electronically Signed   By: Reyes Holder M.D.   On: 02/12/2023 12:47    Medications: I have reviewed the patient's current medications.   Assessment/Plan: Small bowel  carcinoma-stage IV (pT4,pN1,pM1 Admission 06/26/2022 with a small bowel obstruction CT abdomen/pelvis 06/26/2022-small bowel obstruction with transition in the lower abdomen/pelvis, irregular nodular densities in the right lower lobe CT angiogram abdomen/pelvis 06/27/2022-circumferential masslike thickening at the terminal ileum, progressive peritoneal soft tissue at the right paracolic gutter, increase in a posterior lateral left bladder mass, resolution of small bowel dilation, increased distal esophageal wall thickening compared to October 2023 06/30/2022-exploratory laparotomy, right hemicolectomy, and peritoneal biopsy-firm mass at the distal ileum with peritoneal implants-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, tumor perforates the visceral peritoneum and invades the cecum, negative resection margins, 2/13 nodes, 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  No evidence of progressive disease. Cycle 4 FOLFOX 11/30/2022, oxaliplatin  dose reduced Cycle 5 FOLFOX 12/19/2022, oxaliplatin  dose reduced due to neutropenia, Neulasta  Cycle 6 FOLFOX 01/02/2023, Neulasta  held secondary to neutrophilia Cycle 7 FOLFOX 01/16/2023, Neulasta  Cycle 8 FOLFOX 01/30/2023, Neulasta  held secondary to neutrophilia CTs 02/12/2023: Slight increase in nodular omental thickening and ascites, stable pulmonary  nodules-favor benign   2.  COPD 3.  CAD 4.  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  Enlarging bladder mass on CT 06/27/2022 6.  Mother with cervical cancer 7.  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  He has completed  8 cycles of FOLFOX.  He has tolerated the chemotherapy well.  He is developing oxaliplatin  neuropathy.  I reviewed the restaging CT findings and images with Mr. Doberstein and his family.  There appears to be minimal evidence of disease progression, though it is very difficult to assess for measurable disease on the CT.  He does not have visceral organ involvement and the lack of abdominal fat makes it difficult to confirm measurable disease.  We discussed treatment options.  This included a discussion of continuing FOLFOX, changing to a maintenance regimen, switching to a different systemic therapy regimen, and a treatment break.  He prefers a treatment break.  He will return for an office visit and further discussion in approximately 4 weeks.  I will present his case at the GI tumor conference for further review of the CT images.  Arley Hof, MD  02/13/2023  1:35 PM

## 2023-02-15 ENCOUNTER — Inpatient Hospital Stay: Payer: Medicare (Managed Care)

## 2023-02-16 ENCOUNTER — Other Ambulatory Visit: Payer: Self-pay

## 2023-02-19 ENCOUNTER — Other Ambulatory Visit: Payer: Self-pay | Admitting: Cardiovascular Disease

## 2023-02-23 ENCOUNTER — Other Ambulatory Visit: Payer: Self-pay

## 2023-02-28 ENCOUNTER — Other Ambulatory Visit: Payer: Self-pay | Admitting: *Deleted

## 2023-02-28 NOTE — Progress Notes (Signed)
 The proposed treatment discussed in conference is for discussion purpose only and is not a binding recommendation.  The patients have not been physically examined, or presented with their treatment options.  Therefore, final treatment plans cannot be decided.

## 2023-03-08 ENCOUNTER — Other Ambulatory Visit: Payer: Self-pay | Admitting: Cardiovascular Disease

## 2023-03-10 ENCOUNTER — Other Ambulatory Visit: Payer: Self-pay | Admitting: Oncology

## 2023-03-12 ENCOUNTER — Inpatient Hospital Stay (HOSPITAL_BASED_OUTPATIENT_CLINIC_OR_DEPARTMENT_OTHER)
Admission: EM | Admit: 2023-03-12 | Discharge: 2023-03-15 | DRG: 190 | Disposition: A | Discharge status: Home Health | Payer: No Typology Code available for payment source | Attending: Family Medicine | Admitting: Family Medicine

## 2023-03-12 ENCOUNTER — Other Ambulatory Visit: Payer: Self-pay

## 2023-03-12 ENCOUNTER — Encounter: Payer: Self-pay | Admitting: Oncology

## 2023-03-12 ENCOUNTER — Emergency Department (HOSPITAL_BASED_OUTPATIENT_CLINIC_OR_DEPARTMENT_OTHER): Payer: No Typology Code available for payment source

## 2023-03-12 ENCOUNTER — Encounter (HOSPITAL_BASED_OUTPATIENT_CLINIC_OR_DEPARTMENT_OTHER): Payer: Self-pay

## 2023-03-12 DIAGNOSIS — Z79899 Other long term (current) drug therapy: Secondary | ICD-10-CM

## 2023-03-12 DIAGNOSIS — E559 Vitamin D deficiency, unspecified: Secondary | ICD-10-CM | POA: Diagnosis present

## 2023-03-12 DIAGNOSIS — C180 Malignant neoplasm of cecum: Secondary | ICD-10-CM | POA: Diagnosis present

## 2023-03-12 DIAGNOSIS — Z9049 Acquired absence of other specified parts of digestive tract: Secondary | ICD-10-CM | POA: Diagnosis not present

## 2023-03-12 DIAGNOSIS — E860 Dehydration: Secondary | ICD-10-CM | POA: Diagnosis not present

## 2023-03-12 DIAGNOSIS — D72828 Other elevated white blood cell count: Secondary | ICD-10-CM | POA: Diagnosis not present

## 2023-03-12 DIAGNOSIS — J439 Emphysema, unspecified: Secondary | ICD-10-CM | POA: Diagnosis not present

## 2023-03-12 DIAGNOSIS — J9621 Acute and chronic respiratory failure with hypoxia: Secondary | ICD-10-CM | POA: Diagnosis present

## 2023-03-12 DIAGNOSIS — R627 Adult failure to thrive: Secondary | ICD-10-CM | POA: Diagnosis not present

## 2023-03-12 DIAGNOSIS — J441 Chronic obstructive pulmonary disease with (acute) exacerbation: Principal | ICD-10-CM | POA: Diagnosis present

## 2023-03-12 DIAGNOSIS — Z681 Body mass index (BMI) 19 or less, adult: Secondary | ICD-10-CM | POA: Diagnosis not present

## 2023-03-12 DIAGNOSIS — J9601 Acute respiratory failure with hypoxia: Secondary | ICD-10-CM | POA: Diagnosis present

## 2023-03-12 DIAGNOSIS — C786 Secondary malignant neoplasm of retroperitoneum and peritoneum: Secondary | ICD-10-CM | POA: Diagnosis not present

## 2023-03-12 DIAGNOSIS — E43 Unspecified severe protein-calorie malnutrition: Secondary | ICD-10-CM | POA: Diagnosis not present

## 2023-03-12 DIAGNOSIS — Z1152 Encounter for screening for COVID-19: Secondary | ICD-10-CM

## 2023-03-12 DIAGNOSIS — C179 Malignant neoplasm of small intestine, unspecified: Secondary | ICD-10-CM | POA: Diagnosis present

## 2023-03-12 DIAGNOSIS — R64 Cachexia: Secondary | ICD-10-CM | POA: Diagnosis not present

## 2023-03-12 DIAGNOSIS — Z955 Presence of coronary angioplasty implant and graft: Secondary | ICD-10-CM

## 2023-03-12 DIAGNOSIS — Z85828 Personal history of other malignant neoplasm of skin: Secondary | ICD-10-CM

## 2023-03-12 DIAGNOSIS — Z7951 Long term (current) use of inhaled steroids: Secondary | ICD-10-CM | POA: Diagnosis not present

## 2023-03-12 DIAGNOSIS — I252 Old myocardial infarction: Secondary | ICD-10-CM

## 2023-03-12 DIAGNOSIS — I2510 Atherosclerotic heart disease of native coronary artery without angina pectoris: Secondary | ICD-10-CM | POA: Diagnosis not present

## 2023-03-12 DIAGNOSIS — K219 Gastro-esophageal reflux disease without esophagitis: Secondary | ICD-10-CM | POA: Diagnosis not present

## 2023-03-12 DIAGNOSIS — Z9221 Personal history of antineoplastic chemotherapy: Secondary | ICD-10-CM

## 2023-03-12 DIAGNOSIS — R54 Age-related physical debility: Secondary | ICD-10-CM | POA: Diagnosis not present

## 2023-03-12 DIAGNOSIS — D72829 Elevated white blood cell count, unspecified: Secondary | ICD-10-CM

## 2023-03-12 DIAGNOSIS — E785 Hyperlipidemia, unspecified: Secondary | ICD-10-CM | POA: Diagnosis not present

## 2023-03-12 DIAGNOSIS — J069 Acute upper respiratory infection, unspecified: Secondary | ICD-10-CM

## 2023-03-12 DIAGNOSIS — Z7902 Long term (current) use of antithrombotics/antiplatelets: Secondary | ICD-10-CM

## 2023-03-12 DIAGNOSIS — R030 Elevated blood-pressure reading, without diagnosis of hypertension: Secondary | ICD-10-CM

## 2023-03-12 DIAGNOSIS — I10 Essential (primary) hypertension: Secondary | ICD-10-CM | POA: Diagnosis not present

## 2023-03-12 DIAGNOSIS — F17200 Nicotine dependence, unspecified, uncomplicated: Secondary | ICD-10-CM | POA: Diagnosis present

## 2023-03-12 DIAGNOSIS — Z9981 Dependence on supplemental oxygen: Secondary | ICD-10-CM

## 2023-03-12 DIAGNOSIS — Z87891 Personal history of nicotine dependence: Secondary | ICD-10-CM

## 2023-03-12 DIAGNOSIS — Z8049 Family history of malignant neoplasm of other genital organs: Secondary | ICD-10-CM

## 2023-03-12 LAB — CBC
HCT: 42.4 % (ref 39.0–52.0)
HCT: 39.7 % (ref 39.0–52.0)
Hemoglobin: 14.1 g/dL (ref 13.0–17.0)
Hemoglobin: 12.8 g/dL — ABNORMAL LOW (ref 13.0–17.0)
MCH: 33.8 pg (ref 26.0–34.0)
MCH: 33.9 pg (ref 26.0–34.0)
MCHC: 33.3 g/dL (ref 30.0–36.0)
MCHC: 32.2 g/dL (ref 30.0–36.0)
MCV: 101.7 fL — ABNORMAL HIGH (ref 80.0–100.0)
MCV: 105 fL — ABNORMAL HIGH (ref 80.0–100.0)
Platelets: 233 10*3/uL (ref 150–400)
Platelets: 199 10*3/uL (ref 150–400)
RBC: 4.17 MIL/uL — ABNORMAL LOW (ref 4.22–5.81)
RBC: 3.78 MIL/uL — ABNORMAL LOW (ref 4.22–5.81)
RDW: 14.2 % (ref 11.5–15.5)
RDW: 14.5 % (ref 11.5–15.5)
WBC: 21.6 10*3/uL — ABNORMAL HIGH (ref 4.0–10.5)
WBC: 13.7 10*3/uL — ABNORMAL HIGH (ref 4.0–10.5)
nRBC: 0 % (ref 0.0–0.2)
nRBC: 0 % (ref 0.0–0.2)

## 2023-03-12 LAB — COMPREHENSIVE METABOLIC PANEL
ALT: 31 U/L (ref 0–44)
AST: 29 U/L (ref 15–41)
Albumin: 4 g/dL (ref 3.5–5.0)
Alkaline Phosphatase: 112 U/L (ref 38–126)
Anion gap: 8 (ref 5–15)
BUN: 29 mg/dL — ABNORMAL HIGH (ref 8–23)
CO2: 33 mmol/L — ABNORMAL HIGH (ref 22–32)
Calcium: 9.3 mg/dL (ref 8.9–10.3)
Chloride: 100 mmol/L (ref 98–111)
Creatinine, Ser: 0.66 mg/dL (ref 0.61–1.24)
GFR, Estimated: >60 mL/min (ref >60)
Glucose, Bld: 150 mg/dL — ABNORMAL HIGH (ref 70–99)
Potassium: 3.8 mmol/L (ref 3.5–5.1)
Sodium: 141 mmol/L (ref 135–145)
Total Bilirubin: 0.5 mg/dL (ref 0.0–1.2)
Total Protein: 6.5 g/dL (ref 6.5–8.1)

## 2023-03-12 LAB — RESP PANEL BY RT-PCR (RSV, FLU A&B, COVID)  RVPGX2
Influenza A by PCR: NEGATIVE
Influenza B by PCR: NEGATIVE
Resp Syncytial Virus by PCR: NEGATIVE
SARS Coronavirus 2 by RT PCR: NEGATIVE

## 2023-03-12 LAB — BRAIN NATRIURETIC PEPTIDE: B Natriuretic Peptide: 155.9 pg/mL — ABNORMAL HIGH (ref 0.0–100.0)

## 2023-03-12 LAB — HIV ANTIBODY (ROUTINE TESTING W REFLEX): HIV Screen 4th Generation wRfx: NONREACTIVE

## 2023-03-12 LAB — CREATININE, SERUM
Creatinine, Ser: 0.72 mg/dL (ref 0.61–1.24)
GFR, Estimated: >60 mL/min (ref >60)

## 2023-03-12 LAB — TROPONIN I (HIGH SENSITIVITY): Troponin I (High Sensitivity): 13 ng/L (ref <18)

## 2023-03-12 MED ORDER — CHLORHEXIDINE GLUCONATE CLOTH 2 % EX PADS
6.0000 | MEDICATED_PAD | Freq: Every day | CUTANEOUS | Status: DC
  Administered 2023-03-12 – 2023-03-15 (×4): 6 via TOPICAL

## 2023-03-12 MED ORDER — IPRATROPIUM-ALBUTEROL 0.5-2.5 (3) MG/3ML IN SOLN
3.0000 mL | RESPIRATORY_TRACT | Status: DC
  Administered 2023-03-12: 3 mL via RESPIRATORY_TRACT
  Filled 2023-03-12: qty 3

## 2023-03-12 MED ORDER — IPRATROPIUM-ALBUTEROL 0.5-2.5 (3) MG/3ML IN SOLN
3.0000 mL | Freq: Once | RESPIRATORY_TRACT | Status: AC
  Administered 2023-03-12: 3 mL via RESPIRATORY_TRACT
  Filled 2023-03-12: qty 3

## 2023-03-12 MED ORDER — ALBUTEROL SULFATE (2.5 MG/3ML) 0.083% IN NEBU
5.0000 mg | INHALATION_SOLUTION | Freq: Once | RESPIRATORY_TRACT | Status: AC
  Administered 2023-03-12: 5 mg via RESPIRATORY_TRACT
  Filled 2023-03-12: qty 6

## 2023-03-12 MED ORDER — LACTATED RINGERS IV SOLN
INTRAVENOUS | Status: AC
Start: 2023-03-12 — End: 2023-03-13

## 2023-03-12 MED ORDER — ALBUTEROL SULFATE (2.5 MG/3ML) 0.083% IN NEBU
5.0000 mg | INHALATION_SOLUTION | Freq: Once | RESPIRATORY_TRACT | Status: DC
  Filled 2023-03-12: qty 6

## 2023-03-12 MED ORDER — ENOXAPARIN SODIUM 40 MG/0.4ML IJ SOSY
40.0000 mg | PREFILLED_SYRINGE | INTRAMUSCULAR | Status: DC
  Administered 2023-03-12 – 2023-03-14 (×3): 40 mg via SUBCUTANEOUS
  Filled 2023-03-12 (×3): qty 0.4

## 2023-03-12 MED ORDER — ALBUTEROL SULFATE (2.5 MG/3ML) 0.083% IN NEBU
2.5000 mg | INHALATION_SOLUTION | Freq: Once | RESPIRATORY_TRACT | Status: AC
  Administered 2023-03-12: 2.5 mg via RESPIRATORY_TRACT
  Filled 2023-03-12: qty 3

## 2023-03-12 MED ORDER — ALBUTEROL SULFATE (2.5 MG/3ML) 0.083% IN NEBU
2.5000 mg | INHALATION_SOLUTION | RESPIRATORY_TRACT | Status: DC | PRN
  Administered 2023-03-13: 2.5 mg via RESPIRATORY_TRACT
  Filled 2023-03-12: qty 3

## 2023-03-12 MED ORDER — LOSARTAN POTASSIUM 50 MG PO TABS
25.0000 mg | ORAL_TABLET | Freq: Every day | ORAL | Status: DC
  Administered 2023-03-13 – 2023-03-15 (×3): 25 mg via ORAL
  Filled 2023-03-12 (×3): qty 1

## 2023-03-12 MED ORDER — SODIUM CHLORIDE 0.9 % IV SOLN
1.0000 g | Freq: Once | INTRAVENOUS | Status: AC
  Administered 2023-03-12: 1 g via INTRAVENOUS
  Filled 2023-03-12: qty 10

## 2023-03-12 MED ORDER — ATORVASTATIN CALCIUM 40 MG PO TABS
40.0000 mg | ORAL_TABLET | Freq: Every day | ORAL | Status: DC
  Administered 2023-03-12 – 2023-03-14 (×3): 40 mg via ORAL
  Filled 2023-03-12 (×3): qty 1

## 2023-03-12 MED ORDER — SODIUM CHLORIDE 0.9% FLUSH: 10.0000 mL | INTRAVENOUS | Status: DC | PRN

## 2023-03-12 MED ORDER — PREDNISONE 20 MG PO TABS
40.0000 mg | ORAL_TABLET | Freq: Every day | ORAL | Status: DC
  Administered 2023-03-14 – 2023-03-15 (×2): 40 mg via ORAL
  Filled 2023-03-12 (×2): qty 2

## 2023-03-12 MED ORDER — ONDANSETRON HCL 4 MG PO TABS: 8.0000 mg | ORAL_TABLET | Freq: Three times a day (TID) | ORAL | Status: DC | PRN

## 2023-03-12 MED ORDER — IPRATROPIUM-ALBUTEROL 0.5-2.5 (3) MG/3ML IN SOLN
3.0000 mL | Freq: Four times a day (QID) | RESPIRATORY_TRACT | Status: DC
  Administered 2023-03-13 (×4): 3 mL via RESPIRATORY_TRACT
  Filled 2023-03-12 (×4): qty 3

## 2023-03-12 MED ORDER — METHYLPREDNISOLONE SODIUM SUCC 125 MG IJ SOLR
125.0000 mg | Freq: Once | INTRAMUSCULAR | Status: AC
  Administered 2023-03-12: 125 mg via INTRAVENOUS
  Filled 2023-03-12: qty 2

## 2023-03-12 MED ORDER — SODIUM CHLORIDE 0.9 % IV SOLN
500.0000 mg | Freq: Once | INTRAVENOUS | Status: AC
  Administered 2023-03-12: 500 mg via INTRAVENOUS
  Filled 2023-03-12: qty 5

## 2023-03-12 MED ORDER — IPRATROPIUM-ALBUTEROL 0.5-2.5 (3) MG/3ML IN SOLN
RESPIRATORY_TRACT | Status: AC
  Administered 2023-03-12: 3 mL via RESPIRATORY_TRACT
  Filled 2023-03-12: qty 3

## 2023-03-12 MED ORDER — METHYLPREDNISOLONE SODIUM SUCC 40 MG IJ SOLR
40.0000 mg | Freq: Two times a day (BID) | INTRAMUSCULAR | Status: AC
  Administered 2023-03-12 – 2023-03-13 (×2): 40 mg via INTRAVENOUS
  Filled 2023-03-12 (×2): qty 1

## 2023-03-12 MED ORDER — IPRATROPIUM BROMIDE 0.02 % IN SOLN
0.5000 mg | Freq: Once | RESPIRATORY_TRACT | Status: AC
  Administered 2023-03-12: 0.5 mg via RESPIRATORY_TRACT
  Filled 2023-03-12: qty 2.5

## 2023-03-12 MED ORDER — IPRATROPIUM BROMIDE 0.02 % IN SOLN
0.5000 mg | Freq: Once | RESPIRATORY_TRACT | Status: DC
  Filled 2023-03-12: qty 2.5

## 2023-03-12 MED ORDER — ALBUTEROL SULFATE (2.5 MG/3ML) 0.083% IN NEBU
INHALATION_SOLUTION | RESPIRATORY_TRACT | Status: AC
  Administered 2023-03-12: 2.5 mg via RESPIRATORY_TRACT
  Filled 2023-03-12: qty 3

## 2023-03-12 MED ORDER — METOPROLOL SUCCINATE ER 25 MG PO TB24
25.0000 mg | ORAL_TABLET | Freq: Every day | ORAL | Status: DC
  Administered 2023-03-12 – 2023-03-15 (×4): 25 mg via ORAL
  Filled 2023-03-12 (×4): qty 1

## 2023-03-12 MED ORDER — DOXYCYCLINE HYCLATE 100 MG PO TABS
100.0000 mg | ORAL_TABLET | Freq: Two times a day (BID) | ORAL | Status: DC
  Administered 2023-03-13 – 2023-03-15 (×5): 100 mg via ORAL
  Filled 2023-03-12 (×6): qty 1

## 2023-03-12 MED ORDER — IPRATROPIUM-ALBUTEROL 0.5-2.5 (3) MG/3ML IN SOLN: 3.0000 mL | Freq: Once | RESPIRATORY_TRACT | Status: DC

## 2023-03-12 MED ORDER — ALBUTEROL SULFATE (2.5 MG/3ML) 0.083% IN NEBU: 2.5000 mg | INHALATION_SOLUTION | Freq: Once | RESPIRATORY_TRACT | Status: DC

## 2023-03-12 MED ORDER — MOMETASONE FURO-FORMOTEROL FUM 100-5 MCG/ACT IN AERO
2.0000 | INHALATION_SPRAY | Freq: Two times a day (BID) | RESPIRATORY_TRACT | Status: DC
  Administered 2023-03-13 – 2023-03-15 (×5): 2 via RESPIRATORY_TRACT
  Filled 2023-03-12: qty 8.8

## 2023-03-12 NOTE — H&P (Signed)
History and Physical    Patient: Austin [name] ZOX:[id number] DOB: 03-16-1940 DOA: 03/12/2023 DOS: the patient was seen and examined on 03/12/2023 PCP: [facility]  Patient coming from: Home  Chief Complaint:  Chief Complaint  Patient presents with   Shortness of Breath   HPI: Austin [name] is a 83 y.o. male with medical history significant of COPD, coronary artery disease, GERD, hyperlipidemia, coronary artery disease, tobacco abuse who presents to the ER at [facility] with shortness of breath.  Patient also had productive cough.  No sore throat or runny nose no sick contacts.  He has productive cough with yellow-green sputum with no hemoptysis.  Patient has history of small bowel cancer of the cecum and on chemotherapy.  Last chemotherapy was 3 weeks ago.  Does not appear to have pancytopenia.  Patient was evaluated and found to have significant cough and wheezing.  He appears to be having acute exacerbation of COPD.  No evidence of pneumonia.  Negative for COVID and flu and other viral screen.  Patient accepted for admission to [facility] for treatment of acute exacerbation of COPD.  Review of Systems: As mentioned in the history of present illness. All other systems reviewed and are negative. Past Medical History:  Diagnosis Date   CAD (coronary artery disease)    COPD (chronic obstructive pulmonary disease) (HCC)    Dyspnea on exertion    GERD (gastroesophageal reflux disease)    Hyperlipidemia    MI (myocardial infarction) (HCC)    Rectal bleeding    SBO (small bowel obstruction) (HCC)    Sept '23   Tobacco abuse    Unspecified essential hypertension    Past Surgical History:  Procedure Laterality Date   CORONARY ANGIOPLASTY WITH STENT PLACEMENT     Drug eluting stent placed to the first obtuse marginal with a 2.58 mm cypher drug eluting stent. Circumflex was treated with 3.0 x 18 mm TAXUS drug-eluting stent on 07/17/03   ENTEROSCOPY N/A 11/07/2021    Procedure: ENTEROSCOPY;  Surgeon: Charlott Rakes, MD;  Location: [facility] ENDOSCOPY;  Service: Gastroenterology;  Laterality: N/A;   EYE SURGERY  1971   bilateral   IR IMAGING GUIDED PORT INSERTION  10/02/2022   LAPAROSCOPY N/A 06/30/2022   Procedure: LAPAROSCOPY DIAGNOSTIC;  Surgeon: Manus Rudd, MD;  Location: [facility] OR;  Service: General;  Laterality: N/A;   LAPAROTOMY N/A 06/30/2022   Procedure: EXPLORATORY LAPAROTOMY, RIGHT HEMICOLECTOMY, PERITONEAL BIOPSY;  Surgeon: Manus Rudd, MD;  Location: [facility] OR;  Service: General;  Laterality: N/A;   PTCA     And drug-eluting stent to the RCA   RECTAL SURGERY  2008   [name] did this surgery   Social History:  reports that he has quit smoking. His smoking use included cigarettes. He has never used smokeless tobacco. He reports that he does not currently use alcohol. He reports that he does not use drugs.  No Known Allergies  Family History  Problem Relation Age of Onset   Cancer Mother        cervical   Lung disease Father        Black lung    Prior to Admission medications   Medication Sig Start Date End Date Taking? Authorizing Provider  atorvastatin (LIPITOR) 40 MG tablet Take 1 tablet (40 mg total) by mouth at bedtime. Please keep scheduled appointment for future refills. Thank you. 03/08/23   Kathleene Hazel, MD  clopidogrel (PLAVIX) 75 MG tablet Take 1 tablet (  75 mg total) by mouth daily. Please keep scheduled appointment for future refills. Thank you. 03/08/23   Kathleene Hazel, MD  ipratropium-albuterol (DUONEB) 0.5-2.5 (3) MG/3ML SOLN Take 3 mLs by nebulization every 4 (four) hours as needed. Patient not taking: Reported on 02/13/2023    [provider]  lidocaine-prilocaine (EMLA) cream Apply 1 Application topically as needed (Apply to port site 1 hour prior to appt, start using with second treatment). 10/12/22   Ladene Artist, MD  losartan (COZAAR) 25 MG tablet Take 1 tablet (25 mg total) by mouth daily.  Please keep scheduled appointment for future refills. Thank you. 03/08/23   Kathleene Hazel, MD  metoprolol succinate (TOPROL-XL) 25 MG 24 hr tablet TAKE 1 TABLET DAILY 02/20/23   Kathleene Hazel, MD  mometasone-formoterol (DULERA) 100-5 MCG/ACT AERO Inhale 2 puffs into the lungs 2 (two) times daily as needed for shortness of breath or wheezing. 01/05/23   [provider]  nitroGLYCERIN (NITROSTAT) 0.4 MG SL tablet Place 1 tablet (0.4 mg total) under the tongue as directed. Patient not taking: Reported on 02/13/2023 11/10/19   Kathleene Hazel, MD  ondansetron (ZOFRAN) 8 MG tablet Take 8 mg by mouth every 8 (eight) hours as needed for nausea or vomiting. 01/30/23   [provider]  polyethylene glycol (MIRALAX / GLYCOLAX) 17 g packet Take 17 g by mouth daily as needed for moderate constipation or severe constipation (if not helped with colace). Patient not taking: Reported on 02/13/2023 07/04/22   Joycelyn Das, MD  PROAIR HFA 108 [phone] Base) MCG/ACT inhaler Inhale 1 puff into the lungs every 4 (four) hours as needed for shortness of breath. 01/22/16   [provider]  prochlorperazine (COMPAZINE) 5 MG tablet Take 1 tablet (5 mg total) by mouth every 6 (six) hours as needed for nausea or vomiting. 10/12/22   Ladene Artist, MD  SPIRIVA RESPIMAT 2.5 MCG/ACT AERS Inhale 2 puffs into the lungs daily. 12/23/18   [provider]  traMADol (ULTRAM) 50 MG tablet Take 1 tablet (50 mg total) by mouth every 6 (six) hours as needed for moderate pain or severe pain. Patient not taking: Reported on 02/13/2023 07/04/22   Joycelyn Das, MD  triamcinolone cream (KENALOG) 0.1 % Apply 1 Application topically 2 (two) times daily. Patient not taking: Reported on 02/13/2023 11/04/20   [provider]    Physical Exam: Vitals:   03/12/23 1500 03/12/23 1528 03/12/23 1600 03/12/23 1810  BP: (!) 169/101  (!) 138/91 136/71  Pulse: (!) 110  (!) 116 98  Resp:  (!) 22  (!) 27 16  Temp:  98.4 F (36.9 C)  97.6 F (36.4 C)  TempSrc:  Oral    SpO2: 98%  97% 96%  Weight:      Height:       Constitutional: Acutely ill looking NAD, calm, comfortable Eyes: PERRL, lids and conjunctivae normal ENMT: Mucous membranes are moist. Posterior pharynx clear of any exudate or lesions.Normal dentition.  Neck: normal, supple, no masses, no thyromegaly Respiratory: Decreased air entry with marked expiratory wheezing, no crackles. Normal respiratory effort. No accessory muscle use.  Cardiovascular: Sinus tachycardia, no murmurs / rubs / gallops. No extremity edema. 2+ pedal pulses. No carotid bruits.  Abdomen: no tenderness, no masses palpated. No hepatosplenomegaly. Bowel sounds positive.  Musculoskeletal: Good range of motion, no joint swelling or tenderness, Skin: no rashes, lesions, ulcers. No induration Neurologic: CN 2-12 grossly intact. Sensation intact, DTR normal. Strength 5/5 in all  4.  Psychiatric: Normal judgment and insight. Alert and oriented x 3. Normal mood  Data Reviewed:  Glucose is 150 CO2 33 and BUN 29, BNP 155, white count 21.6 acute viral screen is negative chest x-ray is negative EKG shows sinus tachycardia  Assessment and Plan:  #1 acute on chronic respiratory failure with hypoxia: Oxygen sat was 88% on room air.  Currently is 96% on 2 L.  Secondary to COPD exacerbation.  Will keep patient on oxygen and treat underlying cause.  If better will titrate patient off oxygen.  #2 acute exacerbation of COPD: Will initiate IV steroid, antibiotics and breathing treatments.  Continue other home regimen.  #3 leukocytosis: No obvious pneumonia.  Not sure of the actual cause.  Could be viral.  Has not been exposed to steroid lately.  Will continue to monitor white count.  #4 sinus tachycardia: Secondary to COPD exacerbation.  Will continue to monitor.  #5 essential hypertension: We will confirm and continue home regimen.  Patient on losartan.  Also  metoprolol.  #6 hyperlipidemia: Continue atorvastatin.    Advance Care Planning:   Code Status: Full Code   Consults: None  Family Communication: No family at bedside  Severity of Illness: The appropriate patient status for this patient is INPATIENT. Inpatient status is judged to be reasonable and necessary in order to provide the required intensity of service to ensure the patient's safety. The patient's presenting symptoms, physical exam findings, and initial radiographic and laboratory data in the context of their chronic comorbidities is felt to place them at high risk for further clinical deterioration. Furthermore, it is not anticipated that the patient will be medically stable for discharge from the hospital within 2 midnights of admission.   * I certify that at the point of admission it is my clinical judgment that the patient will require inpatient hospital care spanning beyond 2 midnights from the point of admission due to high intensity of service, high risk for further deterioration and high frequency of surveillance required.*  AuthorLonia Blood, MD 03/12/2023 6:52 PM  For on call review [email or web address].

## 2023-03-12 NOTE — ED Triage Notes (Signed)
Onset three days of short of breath.  Present labored breathing O2 sats 88%.  Denies chest pain.

## 2023-03-12 NOTE — ED Provider Notes (Addendum)
[place] EMERGENCY DEPARTMENT AT [facility] Provider Note   CSN: [id number] Arrival date & time: 03/12/23  1103     History  Chief Complaint  Patient presents with  . Shortness of Breath    Austin [name] is a 83 y.o. male.  Pt w hx metastatic small bowel ca, copd, c/o increased sob, wheezing, and prod cough (yellow/green, no hemoptysis) in the past few days. No sore throat or runny nose. No known ill contacts. No chest pain or discomfort. No fever or chills. Last chemotherapy was 3 weeks ago. No leg pain or swelling. Compliant w home meds. No current smoking.   The history is provided by the patient, medical records and a relative.  Shortness of Breath Associated symptoms: cough and wheezing   Associated symptoms: no abdominal pain, no chest pain, no fever, no headaches, no neck pain, no rash, no sore throat and no vomiting        Home Medications Prior to Admission medications   Medication Sig Start Date End Date Taking? Authorizing Provider  atorvastatin (LIPITOR) 40 MG tablet Take 1 tablet (40 mg total) by mouth at bedtime. Please keep scheduled appointment for future refills. Thank you. 03/08/23   Kathleene Hazel, MD  clopidogrel (PLAVIX) 75 MG tablet Take 1 tablet (75 mg total) by mouth daily. Please keep scheduled appointment for future refills. Thank you. 03/08/23   Kathleene Hazel, MD  ipratropium-albuterol (DUONEB) 0.5-2.5 (3) MG/3ML SOLN Take 3 mLs by nebulization every 4 (four) hours as needed. Patient not taking: Reported on 02/13/2023    [provider]  lidocaine-prilocaine (EMLA) cream Apply 1 Application topically as needed (Apply to port site 1 hour prior to appt, start using with second treatment). 10/12/22   Ladene Artist, MD  losartan (COZAAR) 25 MG tablet Take 1 tablet (25 mg total) by mouth daily. Please keep scheduled appointment for future refills. Thank you. 03/08/23   Kathleene Hazel, MD  metoprolol succinate  (TOPROL-XL) 25 MG 24 hr tablet TAKE 1 TABLET DAILY 02/20/23   Kathleene Hazel, MD  mometasone-formoterol (DULERA) 100-5 MCG/ACT AERO Inhale 2 puffs into the lungs 2 (two) times daily as needed for shortness of breath or wheezing. 01/05/23   [provider]  nitroGLYCERIN (NITROSTAT) 0.4 MG SL tablet Place 1 tablet (0.4 mg total) under the tongue as directed. Patient not taking: Reported on 02/13/2023 11/10/19   Kathleene Hazel, MD  ondansetron (ZOFRAN) 8 MG tablet Take 8 mg by mouth every 8 (eight) hours as needed for nausea or vomiting. 01/30/23   [provider]  polyethylene glycol (MIRALAX / GLYCOLAX) 17 g packet Take 17 g by mouth daily as needed for moderate constipation or severe constipation (if not helped with colace). Patient not taking: Reported on 02/13/2023 07/04/22   Joycelyn Das, MD  PROAIR HFA [phone] Base) MCG/ACT inhaler Inhale 1 puff into the lungs every 4 (four) hours as needed for shortness of breath. 01/22/16   [provider]  prochlorperazine (COMPAZINE) 5 MG tablet Take 1 tablet (5 mg total) by mouth every 6 (six) hours as needed for nausea or vomiting. 10/12/22   Ladene Artist, MD  SPIRIVA RESPIMAT 2.5 MCG/ACT AERS Inhale 2 puffs into the lungs daily. 12/23/18   [provider]  traMADol (ULTRAM) 50 MG tablet Take 1 tablet (50 mg total) by mouth every 6 (six) hours as needed for moderate pain or severe pain. Patient not taking: Reported on 02/13/2023 07/04/22  Pokhrel, Laxman, MD  triamcinolone cream (KENALOG) 0.1 % Apply 1 Application topically 2 (two) times daily. Patient not taking: Reported on 02/13/2023 11/04/20   [provider]      Allergies    Patient has no known allergies.    Review of Systems   Review of Systems  Constitutional:  Negative for fever.  HENT:  Negative for sore throat.   Eyes:  Negative for redness.  Respiratory:  Positive for cough, shortness of breath and wheezing.    Cardiovascular:  Negative for chest pain and leg swelling.  Gastrointestinal:  Negative for abdominal pain and vomiting.  Genitourinary:  Negative for flank pain.  Musculoskeletal:  Negative for neck pain and neck stiffness.  Skin:  Negative for rash.  Neurological:  Negative for headaches.    Physical Exam Updated Vital Signs BP (!) 160/99   Pulse (!) 113   Temp 98.2 F (36.8 C)   Resp (!) 25   Ht 1.778 m (5\' 10" )   Wt 46.6 kg   SpO2 96%   BMI 14.74 kg/m  Physical Exam Vitals and nursing note reviewed.  Constitutional:      Appearance: He is well-developed.     Comments: Frail, cachectic appearing.   HENT:     Head: Atraumatic.     Nose: Nose normal.     Mouth/Throat:     Mouth: Mucous membranes are moist.     Pharynx: Oropharynx is clear.  Eyes:     General: No scleral icterus.    Conjunctiva/sclera: Conjunctivae normal.  Neck:     Trachea: No tracheal deviation.  Cardiovascular:     Rate and Rhythm: Normal rate and regular rhythm.     Pulses: Normal pulses.     Heart sounds: Normal heart sounds. No murmur heard.    No friction rub. No gallop.  Pulmonary:     Effort: Respiratory distress present. No accessory muscle usage.     Breath sounds: Wheezing and rhonchi present.     Comments: Diminished breath sounds and diffuse wheezing.  Abdominal:     General: Bowel sounds are normal. There is no distension.     Palpations: Abdomen is soft.     Tenderness: There is no abdominal tenderness. There is no guarding.  Genitourinary:    Comments: No cva tenderness. Musculoskeletal:        General: No swelling or tenderness.     Cervical back: Normal range of motion and neck supple. No rigidity.     Right lower leg: No edema.     Left lower leg: No edema.  Skin:    General: Skin is warm and dry.     Findings: No rash.  Neurological:     Mental Status: He is alert.     Comments: Alert, speech clear.   Psychiatric:        Mood and Affect: Mood normal.    ED  Results / Procedures / Treatments   Labs (all labs ordered are listed, but only abnormal results are displayed) Results for orders placed or performed during the hospital encounter of 03/12/23  Resp panel by RT-PCR (RSV, Flu A&B, Covid) Anterior Nasal Swab   Collection Time: 03/12/23 12:19 PM   Specimen: Anterior Nasal Swab  Result Value Ref Range   SARS Coronavirus 2 by RT PCR NEGATIVE NEGATIVE   Influenza A by PCR NEGATIVE NEGATIVE   Influenza B by PCR NEGATIVE NEGATIVE   Resp Syncytial Virus by PCR NEGATIVE NEGATIVE  CBC  Collection Time: 03/12/23 12:19 PM  Result Value Ref Range   WBC 21.6 (H) 4.0 - 10.5 K/uL   RBC 4.17 (L) 4.22 - 5.81 MIL/uL   Hemoglobin 14.1 13.0 - 17.0 g/dL   HCT 09.8 11.9 - 14.7 %   MCV 101.7 (H) 80.0 - 100.0 fL   MCH 33.8 26.0 - 34.0 pg   MCHC 33.3 30.0 - 36.0 g/dL   RDW 82.9 56.2 - 13.0 %   Platelets 233 150 - 400 K/uL   nRBC 0.0 0.0 - 0.2 %  Comprehensive metabolic panel   Collection Time: 03/12/23 12:19 PM  Result Value Ref Range   Sodium 141 135 - 145 mmol/L   Potassium 3.8 3.5 - 5.1 mmol/L   Chloride 100 98 - 111 mmol/L   CO2 33 (H) 22 - 32 mmol/L   Glucose, Bld 150 (H) 70 - 99 mg/dL   BUN 29 (H) 8 - 23 mg/dL   Creatinine, Ser 8.65 0.61 - 1.24 mg/dL   Calcium 9.3 8.9 - 78.4 mg/dL   Total Protein 6.5 6.5 - 8.1 g/dL   Albumin 4.0 3.5 - 5.0 g/dL   AST 29 15 - 41 U/L   ALT 31 0 - 44 U/L   Alkaline Phosphatase 112 38 - 126 U/L   Total Bilirubin 0.5 0.0 - 1.2 mg/dL   GFR, Estimated >69 >62 mL/min   Anion gap 8 5 - 15  Brain natriuretic peptide   Collection Time: 03/12/23 12:19 PM  Result Value Ref Range   B Natriuretic Peptide 155.9 (H) 0.0 - 100.0 pg/mL  Troponin I (High Sensitivity)   Collection Time: 03/12/23 12:19 PM  Result Value Ref Range   Troponin I (High Sensitivity) 13 <18 ng/L   DG Chest Port 1 View Result Date: 03/12/2023 CLINICAL DATA:  Shortness of breath for 3 days. Labored breathing with hypoxemia. EXAM: PORTABLE CHEST 1  VIEW COMPARISON:  Radiographs 11/16/2021 and 04/21/2015.  CT 02/12/2023. FINDINGS: 1157 hours. Right IJ Port-A-Cath extends to the level of the mid right atrium. The heart size and mediastinal contours are stable. The lungs are hyperinflated but clear. No evidence of pleural effusion or pneumothorax. No acute osseous findings are seen. Telemetry leads overlie the chest. IMPRESSION: No evidence of acute cardiopulmonary process. Chronic obstructive pulmonary disease. Electronically Signed   By: Carey Bullocks M.D.   On: 03/12/2023 12:17   CT CHEST ABDOMEN PELVIS W CONTRAST Result Date: 02/12/2023 CLINICAL DATA:  Small bowel cancer follow-up, assess treatment response. * Tracking Code: BO * EXAM: CT CHEST, ABDOMEN, AND PELVIS WITH CONTRAST TECHNIQUE: Multidetector CT imaging of the chest, abdomen and pelvis was performed following the standard protocol during bolus administration of intravenous contrast. RADIATION DOSE REDUCTION: This exam was performed according to the departmental dose-optimization program which includes automated exposure control, adjustment of the mA and/or kV according to patient size and/or use of iterative reconstruction technique. CONTRAST:  70mL OMNIPAQUE IOHEXOL 300 MG/ML  SOLN COMPARISON:  Multiple priors including most recent CT November 27, 2022. FINDINGS: CT CHEST FINDINGS Cardiovascular: Accessed right chest Port-A-Cath with tip in the right atrium. Aortic and branch vessel atherosclerosis. No central pulmonary embolus on this nondedicated study. Three-vessel coronary artery calcifications/stents. Normal size heart. No significant pericardial effusion/thickening. Mediastinum/Nodes: No suspicious thyroid nodule. No pathologically enlarged mediastinal, hilar or axillary lymph nodes. The esophagus is grossly unremarkable. Lungs/Pleura: Stable calcified noncalcified scattered pulmonary nodules for instance in the left upper lobe measuring 5 mm on image 26/4. No new suspicious pulmonary  nodules or masses. Biapical pleuroparenchymal scarring. Severe emphysematous change. Musculoskeletal: No aggressive lytic or blastic lesion of bone. Thoracic kyphosis. Paucity of subcutaneous fat with a diminutive musculature. CT ABDOMEN PELVIS FINDINGS Hepatobiliary: Small cyst in the inferior right lobe of the liver on image 64/2. Stable hemangioma in the left lobe of the liver measuring 6 mm on image 64/2. No new suspicious hepatic lesion. Gallbladder is unremarkable.  No biliary ductal dilation. Pancreas: No pancreatic ductal dilation or evidence of acute inflammation. Spleen: No splenomegaly. Adrenals/Urinary Tract: No suspicious adrenal nodule/mass. No hydronephrosis. Symmetric renal enhancement. Stable bilateral renal cysts. Similar calcific densities in the urinary bladder. Stomach/Bowel: Radiopaque enteric contrast material traverses the C-loop of the duodenum. Bowel assessment is limited by paucity of intra-abdominal fat. Stomach is unremarkable for degree of distension. No pathologic dilation of small or large bowel. Prior right hemicolectomy with ileocolic anastomosis. Colorectal wall thickening is similar prior. Colonic diverticulosis. Vascular/Lymphatic: Aortic atherosclerosis. Normal caliber abdominal aorta. The portal, splenic and superior mesenteric veins are patent. No pathologically enlarged abdominal or pelvic lymph nodes. Reproductive: Stable heterogeneity and mild enlargement of prostate gland. Other: Similar cachectic appearance with paucity of intra-abdominal fat. Diffuse omental thickening and trace ascites is increased from prior examination for instance in the left hemiabdomen on image 84/2 and anterior midline abdomen on image 87/2. Musculoskeletal: No aggressive lytic or blastic lesion of bone. Multilevel degenerative change of the spine. IMPRESSION: 1. Slightly increased diffuse nodular omental thickening with trace ascites compatible with worsening peritoneal carcinomatosis. 2. Prior  right hemicolectomy with ileocolic anastomosis. Similar nonspecific colorectal wall thickening. 3. Stable calcified and noncalcified scattered pulmonary nodules, favored benign given stability. 4. Similar calcific densities in the urinary bladder. 5. Stable heterogeneity and mild enlargement of prostate gland. 6. Aortic Atherosclerosis (ICD10-I70.0) and Emphysema (ICD10-J43.9). Electronically Signed   By: Maudry Mayhew M.D.   On: 02/12/2023 12:47    ED ECG REPORT   Date: 03/12/2023  Rate: 91  Rhythm: normal sinus rhythm  QRS Axis: normal  Intervals: normal  ST/T Wave abnormalities: nonspecific ST/T changes  Conduction Disutrbances:none  Narrative Interpretation:   Old EKG Reviewed: unchanged  I have personally reviewed the EKG tracing   Radiology DG Chest Port 1 View Result Date: 03/12/2023 CLINICAL DATA:  Shortness of breath for 3 days. Labored breathing with hypoxemia. EXAM: PORTABLE CHEST 1 VIEW COMPARISON:  Radiographs 11/16/2021 and 04/21/2015.  CT 02/12/2023. FINDINGS: 1157 hours. Right IJ Port-A-Cath extends to the level of the mid right atrium. The heart size and mediastinal contours are stable. The lungs are hyperinflated but clear. No evidence of pleural effusion or pneumothorax. No acute osseous findings are seen. Telemetry leads overlie the chest. IMPRESSION: No evidence of acute cardiopulmonary process. Chronic obstructive pulmonary disease. Electronically Signed   By: Carey Bullocks M.D.   On: 03/12/2023 12:17    Procedures Procedures    Medications Ordered in ED Medications  albuterol (PROVENTIL) (2.5 MG/3ML) 0.083% nebulizer solution 5 mg (5 mg Nebulization Not Given 03/12/23 1331)  ipratropium (ATROVENT) nebulizer solution 0.5 mg (0.5 mg Nebulization Not Given 03/12/23 1331)  cefTRIAXone (ROCEPHIN) 1 g in sodium chloride 0.9 % 100 mL IVPB (1 g Intravenous New Bag/Given 03/12/23 1455)  azithromycin (ZITHROMAX) 500 mg in sodium chloride 0.9 % 250 mL IVPB (has no  administration in time range)  ipratropium-albuterol (DUONEB) 0.5-2.5 (3) MG/3ML nebulizer solution 3 mL (3 mLs Nebulization Given 03/12/23 1123)  albuterol (PROVENTIL) (2.5 MG/3ML) 0.083% nebulizer solution 2.5 mg (2.5 mg Nebulization Given 03/12/23 1151)  ipratropium-albuterol (DUONEB) 0.5-2.5 (3) MG/3ML nebulizer solution (3 mLs  Given 03/12/23 1150)  albuterol (PROVENTIL) (2.5 MG/3ML) 0.083% nebulizer solution 5 mg (5 mg Nebulization Given 03/12/23 1330)  ipratropium (ATROVENT) nebulizer solution 0.5 mg (0.5 mg Nebulization Given 03/12/23 1330)  methylPREDNISolone sodium succinate (SOLU-MEDROL) 125 mg/2 mL injection 125 mg (125 mg Intravenous Given 03/12/23 1225)  albuterol (PROVENTIL) (2.5 MG/3ML) 0.083% nebulizer solution 5 mg (5 mg Nebulization Given 03/12/23 1449)    ED Course/ Medical Decision Making/ A&P                                 Medical Decision Making Problems Addressed: Acute respiratory failure with hypoxia [facility]): acute illness or injury with systemic symptoms that poses a threat to life or bodily functions COPD exacerbation (HCC): acute illness or injury with systemic symptoms that poses a threat to life or bodily functions Elevated blood pressure reading: acute illness or injury Essential hypertension: chronic illness or injury with exacerbation, progression, or side effects of treatment that poses a threat to life or bodily functions Leukocytosis, unspecified type: acute illness or injury Small intestine cancer [facility]): chronic illness or injury with exacerbation, progression, or side effects of treatment that poses a threat to life or bodily functions URI with cough and congestion: acute illness or injury with systemic symptoms that poses a threat to life or bodily functions  Amount and/or Complexity of Data Reviewed Independent Historian:     Details: Family, hx External Data Reviewed: notes. Labs: ordered. Decision-making details documented in ED Course. Radiology:  ordered and independent interpretation performed. Decision-making details documented in ED Course. ECG/medicine tests: ordered. Decision-making details documented in ED Course. Discussion of management or test interpretation with external provider(s): medicine  Risk Prescription drug management. Decision regarding hospitalization.   Iv ns. Continuous pulse ox and cardiac monitoring. Labs ordered/sent. Imaging ordered.   Differential diagnosis includes copd exacerbation, pna, influenza, etc. Dispo decision including potential need for admission considered - will get labs and imaging and reassess.   Reviewed nursing notes and prior charts for additional history. External reports reviewed. Additional history from: family.   Solumedrol iv. Albuterol and atrovent nebs.   Cardiac monitor: sinus rhythm, rate 100.  Labs reviewed/interpreted by me - wbc high, 21. +increased cough, congestion on exam. Rocephin iv, zithromax iv.  Trop normal. Bnp not significantly elevated.   Xrays reviewed/interpreted by me - no def pna.  On room air patient is mildly hypoxic, o2 sats 87%. On 2 liters improved to 93%.   Persistent wheezing, improved air movement. Additional albuterol tx.   Hospitalists consulted for admission.discussed pt with Dr Austin Miles who accepts in transfer to [facility].   CRITICAL CARE RE: copd exacerbation, acute resp failure w hypoxia,  Performed by: Suzi Roots Total critical care time: 40 minutes Critical care time was exclusive of separately billable procedures and treating other patients. Critical care was necessary to treat or prevent imminent or life-threatening deterioration. Critical care was time spent personally by me on the following activities: development of treatment plan with patient and/or surrogate as well as nursing, discussions with consultants, evaluation of patient's response to treatment, examination of patient, obtaining history from patient or surrogate, ordering  and performing treatments and interventions, ordering and review of laboratory studies, ordering and review of radiographic studies, pulse oximetry and re-evaluation of patient's condition.            Final Clinical Impression(s) / ED Diagnoses  Final diagnoses:  COPD exacerbation (HCC)  URI with cough and congestion  Acute respiratory failure with hypoxia (HCC)  Leukocytosis, unspecified type  Elevated blood pressure reading  Essential hypertension  Small intestine cancer [facility])    Rx / DC Orders ED Discharge Orders     None          Cathren Laine, MD 03/12/23 1503

## 2023-03-12 NOTE — ED Notes (Signed)
Called Austin [name] at [facility] for transport: 16:42

## 2023-03-13 DIAGNOSIS — I2510 Atherosclerotic heart disease of native coronary artery without angina pectoris: Secondary | ICD-10-CM | POA: Diagnosis present

## 2023-03-13 DIAGNOSIS — J439 Emphysema, unspecified: Secondary | ICD-10-CM | POA: Diagnosis present

## 2023-03-13 DIAGNOSIS — Z7902 Long term (current) use of antithrombotics/antiplatelets: Secondary | ICD-10-CM | POA: Diagnosis not present

## 2023-03-13 DIAGNOSIS — Z955 Presence of coronary angioplasty implant and graft: Secondary | ICD-10-CM | POA: Diagnosis not present

## 2023-03-13 DIAGNOSIS — R64 Cachexia: Secondary | ICD-10-CM | POA: Diagnosis present

## 2023-03-13 DIAGNOSIS — C786 Secondary malignant neoplasm of retroperitoneum and peritoneum: Secondary | ICD-10-CM | POA: Diagnosis present

## 2023-03-13 DIAGNOSIS — R627 Adult failure to thrive: Secondary | ICD-10-CM | POA: Diagnosis present

## 2023-03-13 DIAGNOSIS — Z7951 Long term (current) use of inhaled steroids: Secondary | ICD-10-CM | POA: Diagnosis not present

## 2023-03-13 DIAGNOSIS — D72828 Other elevated white blood cell count: Secondary | ICD-10-CM | POA: Diagnosis present

## 2023-03-13 DIAGNOSIS — E43 Unspecified severe protein-calorie malnutrition: Secondary | ICD-10-CM | POA: Diagnosis present

## 2023-03-13 DIAGNOSIS — E860 Dehydration: Secondary | ICD-10-CM | POA: Diagnosis present

## 2023-03-13 DIAGNOSIS — K219 Gastro-esophageal reflux disease without esophagitis: Secondary | ICD-10-CM | POA: Diagnosis present

## 2023-03-13 DIAGNOSIS — Z681 Body mass index (BMI) 19 or less, adult: Secondary | ICD-10-CM | POA: Diagnosis not present

## 2023-03-13 DIAGNOSIS — Z1152 Encounter for screening for COVID-19: Secondary | ICD-10-CM | POA: Diagnosis not present

## 2023-03-13 DIAGNOSIS — I10 Essential (primary) hypertension: Secondary | ICD-10-CM | POA: Diagnosis present

## 2023-03-13 DIAGNOSIS — E785 Hyperlipidemia, unspecified: Secondary | ICD-10-CM | POA: Diagnosis present

## 2023-03-13 DIAGNOSIS — R54 Age-related physical debility: Secondary | ICD-10-CM | POA: Diagnosis present

## 2023-03-13 DIAGNOSIS — E559 Vitamin D deficiency, unspecified: Secondary | ICD-10-CM | POA: Diagnosis present

## 2023-03-13 DIAGNOSIS — Z9049 Acquired absence of other specified parts of digestive tract: Secondary | ICD-10-CM | POA: Diagnosis not present

## 2023-03-13 DIAGNOSIS — C180 Malignant neoplasm of cecum: Secondary | ICD-10-CM | POA: Diagnosis present

## 2023-03-13 DIAGNOSIS — Z9221 Personal history of antineoplastic chemotherapy: Secondary | ICD-10-CM | POA: Diagnosis not present

## 2023-03-13 DIAGNOSIS — J9601 Acute respiratory failure with hypoxia: Secondary | ICD-10-CM | POA: Diagnosis present

## 2023-03-13 DIAGNOSIS — J441 Chronic obstructive pulmonary disease with (acute) exacerbation: Secondary | ICD-10-CM | POA: Diagnosis present

## 2023-03-13 DIAGNOSIS — Z79899 Other long term (current) drug therapy: Secondary | ICD-10-CM | POA: Diagnosis not present

## 2023-03-13 LAB — CBC
HCT: 38.3 % — ABNORMAL LOW (ref 39.0–52.0)
Hemoglobin: 12.4 g/dL — ABNORMAL LOW (ref 13.0–17.0)
MCH: 33.8 pg (ref 26.0–34.0)
MCHC: 32.4 g/dL (ref 30.0–36.0)
MCV: 104.4 fL — ABNORMAL HIGH (ref 80.0–100.0)
Platelets: 191 10*3/uL (ref 150–400)
RBC: 3.67 MIL/uL — ABNORMAL LOW (ref 4.22–5.81)
RDW: 14.3 % (ref 11.5–15.5)
WBC: 7.7 10*3/uL (ref 4.0–10.5)
nRBC: 0 % (ref 0.0–0.2)

## 2023-03-13 LAB — COMPREHENSIVE METABOLIC PANEL
ALT: 27 U/L (ref 0–44)
AST: 26 U/L (ref 15–41)
Albumin: 3.2 g/dL — ABNORMAL LOW (ref 3.5–5.0)
Alkaline Phosphatase: 99 U/L (ref 38–126)
Anion gap: 8 (ref 5–15)
BUN: 33 mg/dL — ABNORMAL HIGH (ref 8–23)
CO2: 30 mmol/L (ref 22–32)
Calcium: 9 mg/dL (ref 8.9–10.3)
Chloride: 101 mmol/L (ref 98–111)
Creatinine, Ser: 0.69 mg/dL (ref 0.61–1.24)
GFR, Estimated: >60 mL/min (ref >60)
Glucose, Bld: 198 mg/dL — ABNORMAL HIGH (ref 70–99)
Potassium: 3.6 mmol/L (ref 3.5–5.1)
Sodium: 139 mmol/L (ref 135–145)
Total Bilirubin: 0.7 mg/dL (ref 0.0–1.2)
Total Protein: 5.9 g/dL — ABNORMAL LOW (ref 6.5–8.1)

## 2023-03-13 MED ORDER — ADULT MULTIVITAMIN W/MINERALS CH
1.0000 | ORAL_TABLET | Freq: Every day | ORAL | Status: DC
  Administered 2023-03-13 – 2023-03-15 (×3): 1 via ORAL
  Filled 2023-03-13 (×3): qty 1

## 2023-03-13 MED ORDER — TRAMADOL HCL 50 MG PO TABS
50.0000 mg | ORAL_TABLET | Freq: Four times a day (QID) | ORAL | Status: DC | PRN
  Filled 2023-03-13: qty 1

## 2023-03-13 MED ORDER — IPRATROPIUM-ALBUTEROL 0.5-2.5 (3) MG/3ML IN SOLN
3.0000 mL | Freq: Three times a day (TID) | RESPIRATORY_TRACT | Status: DC
  Administered 2023-03-14: 3 mL via RESPIRATORY_TRACT
  Filled 2023-03-13: qty 3

## 2023-03-13 MED ORDER — CLOPIDOGREL BISULFATE 75 MG PO TABS
75.0000 mg | ORAL_TABLET | Freq: Every day | ORAL | Status: DC
  Administered 2023-03-13 – 2023-03-15 (×3): 75 mg via ORAL
  Filled 2023-03-13 (×3): qty 1

## 2023-03-13 MED ORDER — BOOST / RESOURCE BREEZE PO LIQD CUSTOM
1.0000 | Freq: Three times a day (TID) | ORAL | Status: DC
  Administered 2023-03-13 – 2023-03-15 (×6): 1 via ORAL

## 2023-03-13 MED ORDER — POLYETHYLENE GLYCOL 3350 17 G PO PACK: 17.0000 g | PACK | Freq: Every day | ORAL | Status: DC | PRN

## 2023-03-13 MED ORDER — ENSURE ENLIVE PO LIQD: 237.0000 mL | Freq: Two times a day (BID) | ORAL | Status: DC

## 2023-03-13 NOTE — Hospital Course (Addendum)
82 yom w/ COPD, CAD, GERD, hyperlipidemia,tobacco abuse, history of bowel cancer on chemotherapy last dose was 3 weeks ago (at CT abdomen 12/30 showing slightly increased diffuse nodular omental thickening with trace ascites compatible with worsening peritoneal carcinomatosis, prior right hemicolectomy) who presented to the ED at [facility] with shortness of breath,productive cough with yellow-green sputum with no hemoptysis.   In the ED COVID flu negative, vitals with mild tachycardia but hypoxia needing nasal cannula oxygen Labs with significant leukocytosis 29 point care BNP 1.5 troponin 13 Chest x-ray no acute finding-shows COPD.  Patient was admitted for acute COPD exacerbation

## 2023-03-13 NOTE — Plan of Care (Signed)
  Problem: Education: Goal: Knowledge of General Education information will improve Description: Including pain rating scale, medication(s)/side effects and non-pharmacologic comfort measures Outcome: Progressing   Problem: Health Behavior/Discharge Planning: Goal: Ability to manage health-related needs will improve Outcome: Progressing   Problem: Clinical Measurements: Goal: Ability to maintain clinical measurements within normal limits will improve Outcome: Progressing Goal: Diagnostic test results will improve Outcome: Progressing Goal: Respiratory complications will improve Outcome: Progressing   Problem: Education: Goal: Knowledge of disease or condition will improve Outcome: Progressing Goal: Knowledge of the prescribed therapeutic regimen will improve Outcome: Progressing

## 2023-03-13 NOTE — Progress Notes (Signed)
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   In the ED COVID flu negative, vitals with mild tachycardia but hypoxia needing nasal cannula oxygen Labs with significant leukocytosis 29 point care BNP 1.5 troponin 13 Chest x-ray no acute finding-shows COPD.  Patient was admitted for acute COPD exacerbation     Subjective: Seen and examined this morning Feels somewhat better still on 2 L nasal cannula Having wheezing and cough Overnight afebrile, mild tachycardia, needing supplemental oxygen.  Labs remains overall stable leukocytosis has resolved  Assessment and Plan: Principal Problem:   COPD with acute exacerbation (HCC) Active Problems:   HLD (hyperlipidemia)   Dehydration   TOBACCO ABUSE   Essential hypertension   CAD (coronary artery disease)   GERD   Cecal cancer (HCC)   Small bowel carcinoma (HCC)  Acute COPD exacerbation Acute respiratory failure with hypoxia: Chest x-ray no pneumonia, managing with bronchodilators, systemic steroids nebulizer supplemental oxygen I-S PT OT and wean O2 as tolerated-currently maintaining 2 L of cannula saturating 100% having some wheezing cough overall. Cont dulera  Leukocytosis: Likely in the setting of recent chemo, patient afebrile, white counts have normalized.  Essential hypertension Sinus tachycardia: Vitals stable at this time.  Continue metoprolol and losartan  HLD: Continue statin  Bowel cancer on  chemotherapy last dose was 3 weeks ago: CT abdomen 02/12/23 showing slightly increased diffuse nodular omental thickening with trace ascites compatible with worsening peritoneal carcinomatosis, prior right hemicolectomy.  Severe malnutrition:Body mass index is 14.74 kg/m. : RD eval and ongoing diet   DVT prophylaxis: enoxaparin (LOVENOX) injection 40 mg Start: 03/12/23 2200 Code Status:   Code Status: Full Code Family Communication: plan of care discussed with patient/wife  at bedside. Patient status is: Remains hospitalized because of severity of illness Level of care: Telemetry   Dispo: The patient is from: home            Anticipated disposition: TBD Objective: Vitals last 24 hrs: Vitals:   03/13/23 0219 03/13/23 0550 03/13/23 0825 03/13/23 1055  BP: (!) 140/72 (!) 147/77  (!) 176/93  Pulse: 85 72  82  Resp: 15 14    Temp: 97.9 F (36.6 C) 97.8 F (36.6 C)    TempSrc: Oral Oral    SpO2: 98% (!) 76% 92%   Weight:      Height:       Weight change:   Physical Examination: General exam: alert awake,at baseline, older than stated age HEENT:Oral mucosa moist, Ear/Nose WNL grossly Respiratory system: Bilaterally diminished breath sounds,no use of accessory muscle Cardiovascular system: S1 & S2 +, No JVD. Gastrointestinal system: Abdomen soft,NT,ND, BS+ Nervous System: Alert, awake, moving all extremities,and following commands. Extremities: LE edema neg,distal peripheral pulses palpable and warm.  Skin: No rashes,no icterus. MSK: Normal muscle bulk,tone, power   Medications reviewed:  Scheduled Meds:  albuterol  5 mg Nebulization Once   atorvastatin  40 mg Oral QHS   Chlorhexidine Gluconate Cloth  6 each Topical Daily   doxycycline  100 mg Oral Q12H   enoxaparin (LOVENOX) injection  40  mg Subcutaneous Q24H   ipratropium  0.5 mg Nebulization Once   ipratropium-albuterol  3 mL Nebulization Q6H   losartan  25 mg Oral Daily   methylPREDNISolone (SOLU-MEDROL) injection   40 mg Intravenous Q12H   Followed by   Melene Muller ON 03/14/2023] predniSONE  40 mg Oral Q breakfast   metoprolol succinate  25 mg Oral Daily   mometasone-formoterol  2 puff Inhalation BID   Continuous Infusions:  lactated ringers 40 mL/hr at 03/13/23 0411      Diet Order             Diet Heart Room service appropriate? Yes; Fluid consistency: Thin  Diet effective now                  Intake/Output Summary (Last 24 hours) at 03/13/2023 1119 Last data filed at 03/13/2023 1035 Gross per 24 hour  Intake 1402.04 ml  Output 200 ml  Net 1202.04 ml   Net IO Since Admission: 1,202.04 mL [03/13/23 1119]  Wt Readings from Last 3 Encounters:  03/12/23 46.6 kg  02/13/23 46.6 kg  01/30/23 44.6 kg     Unresulted Labs (From admission, onward)     Start     Ordered   03/19/23 0500  Creatinine, serum  (enoxaparin (LOVENOX)    CrCl >/= 30 ml/min)  Weekly,   R     Comments: while on enoxaparin therapy    03/12/23 1852          Data Reviewed: I have personally reviewed following labs and imaging studies CBC: Recent Labs  Lab 03/12/23 1219 03/12/23 1918 03/13/23 0321  WBC 21.6* 13.7* 7.7  HGB 14.1 12.8* 12.4*  HCT 42.4 39.7 38.3*  MCV 101.7* 105.0* 104.4*  PLT 233 199 191   Basic Metabolic Panel:  Recent Labs  Lab 03/12/23 1219 03/12/23 1918 03/13/23 0321  NA 141  --  139  K 3.8  --  3.6  CL 100  --  101  CO2 33*  --  30  GLUCOSE 150*  --  198*  BUN 29*  --  33*  CREATININE 0.66 0.72 0.69  CALCIUM 9.3  --  9.0   GFR: Estimated Creatinine Clearance: 47.7 mL/min (by C-G formula based on SCr of 0.69 mg/dL). Liver Function Tests:  Recent Labs  Lab 03/12/23 1219 03/13/23 0321  AST 29 26  ALT 31 27  ALKPHOS 112 99  BILITOT 0.5 0.7  PROT 6.5 5.9*  ALBUMIN 4.0 3.2*  No results for input(s): "TSH", "T4TOTAL", "FREET4", "T3FREE", "THYROIDAB" in the last 72 hours. Sepsis Labs: No results for input(s): "PROCALCITON", "LATICACIDVEN" in the last 168 hours. Recent  Results (from the past 240 hours)  Resp panel by RT-PCR (RSV, Flu A&B, Covid) Anterior Nasal Swab     Status: None   Collection Time: 03/12/23 12:19 PM   Specimen: Anterior Nasal Swab  Result Value Ref Range Status   SARS Coronavirus 2 by RT PCR NEGATIVE NEGATIVE Final    Comment: (NOTE) SARS-CoV-2 target nucleic acids are NOT DETECTED.  The SARS-CoV-2 RNA is generally detectable in upper respiratory specimens during the acute phase of infection. The lowest concentration of SARS-CoV-2 viral copies this assay can detect is 138 copies/mL. A negative result does not preclude SARS-Cov-2 infection and should not be used as the sole basis for treatment or other patient management decisions. A negative result may occur with  improper specimen collection/handling, submission of specimen other than nasopharyngeal swab, presence of viral mutation(s) within the areas  targeted by this assay, and inadequate number of viral copies(<138 copies/mL). A negative result must be combined with clinical observations, patient history, and epidemiological information. The expected result is Negative.  Fact Sheet for Patients:  BloggerCourse.com  Fact Sheet for Healthcare Providers:  SeriousBroker.it  This test is no t yet approved or cleared by the [place] FDA and  has been authorized for detection and/or diagnosis of SARS-CoV-2 by FDA under an Emergency Use Authorization (EUA). This EUA will remain  in effect (meaning this test can be used) for the duration of the COVID-19 declaration under Section 564(b)(1) of the Act, 21 U.S.C.section 360bbb-3(b)(1), unless the authorization is terminated  or revoked sooner.       Influenza A by PCR NEGATIVE NEGATIVE Final   Influenza B by PCR NEGATIVE NEGATIVE Final    Comment: (NOTE) The Xpert Xpress SARS-CoV-2/FLU/RSV plus assay is intended as an aid in the diagnosis of influenza from Nasopharyngeal swab  specimens and should not be used as a sole basis for treatment. Nasal washings and aspirates are unacceptable for Xpert Xpress SARS-CoV-2/FLU/RSV testing.  Fact Sheet for Patients: BloggerCourse.com  Fact Sheet for Healthcare Providers: SeriousBroker.it  This test is not yet approved or cleared by the [place] FDA and has been authorized for detection and/or diagnosis of SARS-CoV-2 by FDA under an Emergency Use Authorization (EUA). This EUA will remain in effect (meaning this test can be used) for the duration of the COVID-19 declaration under Section 564(b)(1) of the Act, 21 U.S.C. section 360bbb-3(b)(1), unless the authorization is terminated or revoked.     Resp Syncytial Virus by PCR NEGATIVE NEGATIVE Final    Comment: (NOTE) Fact Sheet for Patients: BloggerCourse.com  Fact Sheet for Healthcare Providers: SeriousBroker.it  This test is not yet approved or cleared by the [place] FDA and has been authorized for detection and/or diagnosis of SARS-CoV-2 by FDA under an Emergency Use Authorization (EUA). This EUA will remain in effect (meaning this test can be used) for the duration of the COVID-19 declaration under Section 564(b)(1) of the Act, 21 U.S.C. section 360bbb-3(b)(1), unless the authorization is terminated or revoked.  Performed at [facility], [address] [place], [place] [address]     Antimicrobials/Microbiology: Anti-infectives (From admission, onward)    Start     Dose/Rate Route Frequency Ordered Stop   03/13/23 1000  doxycycline (VIBRA-TABS) tablet 100 mg        100 mg Oral Every 12 hours 03/12/23 1852 03/18/23 0959   03/12/23 1445  cefTRIAXone (ROCEPHIN) 1 g in sodium chloride 0.9 % 100 mL IVPB        1 g 200 mL/hr over 30 Minutes Intravenous  Once 03/12/23 1435 03/12/23 1528   03/12/23 1445  azithromycin (ZITHROMAX)  500 mg in sodium chloride 0.9 % 250 mL IVPB        500 mg 250 mL/hr over 60 Minutes Intravenous  Once 03/12/23 1435 03/12/23 1755         Component Value Date/Time   SDES ABSCESS RIGHT HAND 06/28/2008 2049   SPECREQUEST NONE 06/28/2008 2049   CULT  06/28/2008 2049    MODERATE STAPHYLOCOCCUS AUREUS Note: RIFAMPIN AND GENTAMICIN SHOULD NOT BE USED AS SINGLE DRUGS FOR TREATMENT OF STAPH INFECTIONS. This organism DOES NOT demonstrate inducible Clindamycin resistance in vitro.   REPTSTATUS 07/01/2008 FINAL 06/28/2008 2049     Radiology Studies: [facility] Chest Port 1 View Result Date: 03/12/2023 CLINICAL DATA:  Shortness of breath for 3 days. Labored breathing with  hypoxemia. EXAM: PORTABLE CHEST 1 VIEW COMPARISON:  Radiographs 11/16/2021 and 04/21/2015.  CT 02/12/2023. FINDINGS: 1157 hours. Right IJ Port-A-Cath extends to the level of the mid right atrium. The heart size and mediastinal contours are stable. The lungs are hyperinflated but clear. No evidence of pleural effusion or pneumothorax. No acute osseous findings are seen. Telemetry leads overlie the chest. IMPRESSION: No evidence of acute cardiopulmonary process. Chronic obstructive pulmonary disease. Electronically Signed   By: Carey Bullocks M.D.   On: 03/12/2023 12:17     LOS: 0 days   Total time spent in review of labs and imaging, patient evaluation, formulation of plan, documentation and communication with family: 50 minutes  Lanae Boast, MD  [facility]  03/13/2023, 11:19 AM

## 2023-03-13 NOTE — Evaluation (Signed)
Occupational Therapy Evaluation Patient Details Name: Austin [name] MRN: [id number] DOB: April 10, 1940 Today's Date: 03/13/2023   History of Present Illness Jquan Egelston is an 83 yo male admitted with acute COPD exacerbation. PMH: COPD, CAD, GERD, hyperlipidemia,tobacco abuse, bowel cancer on chemotherapy last dose was 3 weeks ago   Clinical Impression   Pt presents with decline in function and safety with ADLs and ADL mobility with impaired endurance, generalized weakness. PTA pt lives with his wife and was Ind with ADLs/selfcare and used no AD for mobility. Pt currently requires CGA with LB ADLs and Sup with mobility. Pt will benefit from acute OT services to address impairments to maximize level of function and safety Pt with DOE with O2 SATs dropping with minimal exertion. O2 SATs. Pt placed on 2L O2 before sit - stand from EOB. Pt instructed on deep, pursed lip breathing to recover O2 SATs.  At rest on RA 88% Sitting EOB on RA 84% Standing/walking in room  on 2L O2 87-88% Returned to supine in bed 92% on 2L O2, [place] removed, nursing notified      If plan is discharge home, recommend the following: A little help with bathing/dressing/bathroom;A little help with walking and/or transfers;Help with stairs or ramp for entrance    Functional Status Assessment  Patient has had a recent decline in their functional status and demonstrates the ability to make significant improvements in function in a reasonable and predictable amount of time.  Equipment Recommendations  Tub/shower seat    Recommendations for Other Services       Precautions / Restrictions Precautions Precautions: Fall Restrictions Weight Bearing Restrictions Per Provider Order: No      Mobility Bed Mobility Overal bed mobility: Modified Independent                  Transfers Overall transfer level: Needs assistance Equipment used: None, Rolling walker (2 wheels) Transfers: Sit to/from Stand, Bed to  chair/wheelchair/BSC Sit to Stand: Supervision     Step pivot transfers: Supervision            Balance                                           ADL either performed or assessed with clinical judgement   ADL Overall ADL's : Needs assistance/impaired Eating/Feeding: Independent   Grooming: Wash/dry hands;Wash/dry face;Supervision/safety;Standing   Upper Body Bathing: Supervision/ safety;Sitting   Lower Body Bathing: Contact guard assist   Upper Body Dressing : Supervision/safety;Sitting   Lower Body Dressing: Contact guard assist;Sit to/from stand   Toilet Transfer: Supervision/safety;Ambulation;Rolling walker (2 wheels)   Toileting- Clothing Manipulation and Hygiene: Supervision/safety;Sit to/from stand       Functional mobility during ADLs: Supervision/safety;Rolling walker (2 wheels) General ADL Comments: Pt with drop in O2 SATs with minimal exertion     Vision Ability to See in Adequate Light: 0 Adequate Patient Visual Report: No change from baseline       Perception         Praxis         Pertinent Vitals/Pain Pain Assessment Pain Assessment: No/denies pain     Extremity/Trunk Assessment Upper Extremity Assessment Upper Extremity Assessment: Generalized weakness   Lower Extremity Assessment Lower Extremity Assessment: Defer to PT evaluation   Cervical / Trunk Assessment Cervical / Trunk Assessment: Kyphotic   Communication Communication Communication: No apparent difficulties   Cognition  Arousal: Alert Behavior During Therapy: WFL for tasks assessed/performed Overall Cognitive Status: Within Functional Limits for tasks assessed                                       General Comments  RA with SpO2 90% at rest, while amb SpO2 90% but desats to 84% upon return to room, cued for pursed lip breathing improving to 86%, return 2L and SpO2 92%    Exercises     Shoulder Instructions      Home Living  Family/patient expects to be discharged to:: Private residence Living Arrangements: Spouse/significant other Available Help at Discharge: Family;Available 24 hours/day Type of Home: House Home Access: Stairs to enter [employer] of Steps: 2 Entrance Stairs-Rails: Right;Left Home Layout: Two level;Bed/bath upstairs Alternate Level Stairs-Number of Steps: flight Alternate Level Stairs-Rails: Right;Left Bathroom Shower/Tub: Tub/shower unit;Walk-in shower         Home Equipment: None          Prior Functioning/Environment Prior Level of Function : Independent/Modified Independent;Driving;History of Falls (last six months)             Mobility Comments: pt reports ind with community amb without AD, reports slipped on the ice earlier this month and able to get up independently ADLs Comments: Pt Ind with ADL/selfcare, IADLs        OT Problem List: Decreased activity tolerance;Decreased knowledge of use of DME or AE      OT Treatment/Interventions: Self-care/ADL training;DME and/or AE instruction;Therapeutic activities;Patient/family education;Energy conservation    OT Goals(Current goals can be found in the care plan section) Acute Rehab OT Goals Patient Stated Goal: go home OT Goal Formulation: With patient/family Time For Goal Achievement: 03/27/23 Potential to Achieve Goals: Good ADL Goals Pt Will Perform Grooming: with set-up;with caregiver independent in assisting Pt Will Perform Upper Body Bathing: with set-up;with caregiver independent in assisting Pt Will Perform Lower Body Bathing: with supervision;with set-up;sit to/from stand Pt Will Perform Upper Body Dressing: with set-up Pt Will Perform Lower Body Dressing: with supervision;with set-up;sit to/from stand Pt Will Transfer to Toilet: with modified independence;ambulating Pt Will Perform Toileting - Clothing Manipulation and hygiene: with modified independence;sit to/from stand Pt Will Perform  Tub/Shower Transfer: with supervision;ambulating;shower seat;grab bars Additional ADL Goal #1: Pt will verbalize and demo 3 energy conservation strategies for ADLs and ADL mobility  OT Frequency: Min 1X/week    Co-evaluation              AM-PAC OT "6 Clicks" Daily Activity     Outcome Measure Help from another person eating meals?: None Help from another person taking care of personal grooming?: A Little Help from another person toileting, which includes using toliet, bedpan, or urinal?: A Little Help from another person bathing (including washing, rinsing, drying)?: A Little Help from another person to put on and taking off regular upper body clothing?: A Little Help from another person to put on and taking off regular lower body clothing?: A Little 6 Click Score: 19   End of Session Equipment Utilized During Treatment: Gait belt;Rolling walker (2 wheels)  Activity Tolerance: Patient tolerated treatment well Patient left: in bed;with call bell/phone within reach;with family/visitor present  OT Visit Diagnosis: Other abnormalities of gait and mobility (R26.89)                Time: 4034-7425 OT Time Calculation (min): 26 min Charges:  OT General  Charges $OT Visit: 1 Visit OT Evaluation $OT Eval Low Complexity: 1 Low OT Treatments $Therapeutic Activity: 8-22 mins    Galen Manila 03/13/2023, 4:13 PM

## 2023-03-13 NOTE — Progress Notes (Signed)
Initial Nutrition Assessment  DOCUMENTATION CODES:   Severe malnutrition in context of chronic illness, Underweight  INTERVENTION:   -If pt to continue cancer treatment, recommend feeding tube placement. If PEG not possible, consider small bore NGT placement.  -Boost Breeze po TID, each supplement provides 250 kcal and 9 grams of protein  -Magic cup TID with meals, each supplement provides 290 kcal and 9 grams of protein   -Multivitamin with minerals daily  -Liberalized diet to regular given severe malnutrition  -Given poor PO and taste changes will check Vitamin D and Zinc levels in the AM  NUTRITION DIAGNOSIS:   Severe Malnutrition related to chronic illness, cancer and cancer related treatments as evidenced by severe fat depletion, severe muscle depletion, energy intake < or equal to 75% for > or equal to 1 month, percent weight loss.  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 Patient was admitted for acute COPD exacerbation.  Patient in room, wife at bedside.  Patient consumed 100% of breakfast this morning, grits, scrambled eggs and potatoes. Per pt's wife, PTA pt has not been eating well. Has no taste for food, pt states foods taste bland. He states "if eggs don't taste like eggs, I'm not forcing myself to eat it". Pt on heart healthy diet, will liberalize so pt can have other options on menu.  Per  chart review, pt has been having issues with N/V and diarrhea ever since October 2023. 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 Started treatments in August 2024. Pt states his symptoms worsen a few days following his treatments.  Pt's wife will go out and bring pt whatever he is craving but then will not eat much. Has been trying to drink Ensure but states this upsets his stomach so he hasn't been drinking as much as he should. Willing to try [employer]. Likes ice cream so will add Magic cups with meals. Will add daily MVI given likely losses from diarrhea. Will check zinc levels in AM given diarrhea and taste changes (could also be related to treatments). Pt takes a daily Centrum MVI and some veggie and fruit capsule supplements.   Patient's UBW ~160 lbs in 2023. Weighed patient on bed today: 93 lbs Weight recorded for this admission likely copied over from 02/13/23. Pt's wife states he weighed 90 lbs on home scale PTA. Pt has lost 10 lbs since 12/19/22 (9% wt loss x 2.5-3 months, significant for time frame).   Medications: Prednisone, Lactated ringers  Labs reviewed.  NUTRITION - FOCUSED PHYSICAL EXAM:  Flowsheet Row Most Recent Value  Orbital Region Severe depletion  Upper Arm Region Severe depletion  Thoracic and Lumbar Region Mild depletion  Buccal Region Severe depletion  Temple Region Severe depletion  Clavicle Bone Region Severe depletion  Clavicle and Acromion Bone Region Severe depletion  Scapular Bone Region Severe depletion  Dorsal Hand Severe depletion  Patellar Region Severe depletion  Anterior Thigh Region Severe depletion  Posterior Calf Region  Severe depletion  Edema (RD Assessment) None  Hair Reviewed  Thea Alken loss]  Eyes Reviewed  Mouth Reviewed  [edentulous, wearing dentures, buildup on tongue, poor hygiene]  Skin Reviewed  [dry, patchy, redness]  Nails Reviewed       Diet Order:   Diet Order             Diet regular Room service appropriate?  Yes; Fluid consistency: Thin  Diet effective now                   EDUCATION NEEDS:   Education needs have been addressed  Skin:  Skin Assessment: Reviewed RN Assessment  Last BM:  1/27  Height:   Ht Readings from Last 1 Encounters:  03/12/23 5\' 10"  (1.778 m)    Weight:   Wt Readings from Last 1 Encounters:  03/12/23 46.6 kg  Weight on bedscale 03/13/23: 93 lbs   BMI:  Body mass index is 13.3 kg/m.  Estimated Nutritional Needs:   Kcal:  2200-2400  Protein:  95-115g  Fluid:  2.2L/day   Tilda Franco, MS, RD, LDN Inpatient Clinical Dietitian Contact via Secure chat

## 2023-03-13 NOTE — Evaluation (Signed)
Physical Therapy Evaluation Patient Details Name: Austin [name] MRN: [id number] DOB: 1940/07/28 Today's Date: 03/13/2023  History of Present Illness  Austin [name] is an 83 yo male admitted with acute COPD exacerbation. PMH: COPD, CAD, GERD, hyperlipidemia,tobacco abuse, bowel cancer on chemotherapy last dose was 3 weeks ago  Clinical Impression  Pt admitted with above diagnosis. Pt ind at baseline, reports 1 fall when he slipped on ice during the recent winter storm but was able to get up independently, lives with spouse who is also independent. On eval, pt modified ind with bed mobility and transfers. Pt slightly unsteady with initial steps but improves with cues for hand placement on IV pole, ambulates in RA with SpO2 90%. Upon return to room pt noted to desat to 84%, cued for pursed lip beathing on RA and improves to 86% so returned 2L and SpO2 92%. Anticipate no PT f/u at discharge and pt and spouse in agreement. Pt currently with functional limitations due to the deficits listed below (see PT Problem List). Pt will benefit from acute skilled PT to increase their independence and safety with mobility to allow discharge.           If plan is discharge home, recommend the following: Assistance with cooking/housework;Assist for transportation;Help with stairs or ramp for entrance   Can travel by private vehicle        Equipment Recommendations None recommended by PT  Recommendations for Other Services       Functional Status Assessment Patient has had a recent decline in their functional status and demonstrates the ability to make significant improvements in function in a reasonable and predictable amount of time.     Precautions / Restrictions Precautions Precautions: Fall Restrictions Weight Bearing Restrictions Per Provider Order: No      Mobility  Bed Mobility Overal bed mobility: Modified Independent                  Transfers Overall transfer level: Modified  independent Equipment used: None               General transfer comment: no AD, BUE assisting to power up    Ambulation/Gait Ambulation/Gait assistance: Contact guard assist Gait Distance (Feet): 100 Feet Assistive device: IV Pole Gait Pattern/deviations: Step-through pattern, Decreased stride length, Trunk flexed, Narrow base of support Gait velocity: decreased     General Gait Details: trunk slightly forward flexed, narrow BOS, step through gait pattern, on RA with SpO2 90% but desats to 84% upon return to room, cued for pursed lip breathing improving to 86%, return 2L and SpO2 92%  Stairs            Wheelchair Mobility     Tilt Bed    Modified Rankin (Stroke Patients Only)       Balance Overall balance assessment: Mild deficits observed, not formally tested                                           Pertinent Vitals/Pain Pain Assessment Pain Assessment: No/denies pain    Home Living Family/patient expects to be discharged to:: Private residence Living Arrangements: Spouse/significant other Available Help at Discharge: Family;Available 24 hours/day Type of Home: House Home Access: Stairs to enter Entrance Stairs-Rails: Right;Left Entrance Stairs-Number of Steps: 2 Alternate Level Stairs-Number of Steps: flight Home Layout: Two level;Bed/bath upstairs Home Equipment: None  Prior Function Prior Level of Function : Independent/Modified Independent;Driving;History of Falls (last six months)             Mobility Comments: pt reports ind with community amb without AD, reports slipped on the ice earlier this month and able to get up independently ADLs Comments: pt reports ind with self care, spouse performs household chores     Extremity/Trunk Assessment   Upper Extremity Assessment Upper Extremity Assessment: Defer to OT evaluation    Lower Extremity Assessment Lower Extremity Assessment: Overall WFL for tasks assessed     Cervical / Trunk Assessment Cervical / Trunk Assessment: Kyphotic  Communication   Communication Communication: No apparent difficulties  Cognition Arousal: Alert Behavior During Therapy: WFL for tasks assessed/performed Overall Cognitive Status: Within Functional Limits for tasks assessed                                          General Comments General comments (skin integrity, edema, etc.): RA with SpO2 90% at rest, while amb SpO2 90% but desats to 84% upon return to room, cued for pursed lip breathing improving to 86%, return 2L and SpO2 92%    Exercises     Assessment/Plan    PT Assessment Patient needs continued PT services  PT Problem List Decreased activity tolerance       PT Treatment Interventions DME instruction;Gait training;Stair training;Functional mobility training;Therapeutic activities;Therapeutic exercise;Balance training;Neuromuscular re-education;Patient/family education    PT Goals (Current goals can be found in the Care Plan section)  Acute Rehab PT Goals Patient Stated Goal: return home PT Goal Formulation: With patient/family Time For Goal Achievement: 03/27/23 Potential to Achieve Goals: Good    Frequency Min 1X/week     Co-evaluation               AM-PAC PT "6 Clicks" Mobility  Outcome Measure Help needed turning from your back to your side while in a flat bed without using bedrails?: None Help needed moving from lying on your back to sitting on the side of a flat bed without using bedrails?: None Help needed moving to and from a bed to a chair (including a wheelchair)?: None Help needed standing up from a chair using your arms (e.g., wheelchair or bedside chair)?: None Help needed to walk in hospital room?: A Little Help needed climbing 3-5 steps with a railing? : A Little 6 Click Score: 22    End of Session Equipment Utilized During Treatment: Gait belt Activity Tolerance: Patient tolerated treatment  well Patient left: in chair;with call bell/phone within reach;with family/visitor present Nurse Communication: Mobility status PT Visit Diagnosis: Other abnormalities of gait and mobility (R26.89)    Time: 0981-1914 PT Time Calculation (min) (ACUTE ONLY): 26 min   Charges:   PT Evaluation $PT Eval Low Complexity: 1 Low PT Treatments $Gait Training: 8-22 mins PT General Charges $$ ACUTE PT VISIT: 1 Visit         Tori Nanami [place] PT, DPT 03/13/23, 1:37 PM

## 2023-03-14 DIAGNOSIS — J441 Chronic obstructive pulmonary disease with (acute) exacerbation: Secondary | ICD-10-CM | POA: Diagnosis not present

## 2023-03-14 LAB — BASIC METABOLIC PANEL
Anion gap: 9 (ref 5–15)
BUN: 32 mg/dL — ABNORMAL HIGH (ref 8–23)
CO2: 32 mmol/L (ref 22–32)
Calcium: 9.2 mg/dL (ref 8.9–10.3)
Chloride: 97 mmol/L — ABNORMAL LOW (ref 98–111)
Creatinine, Ser: 0.32 mg/dL — ABNORMAL LOW (ref 0.61–1.24)
GFR, Estimated: >60 mL/min (ref >60)
Glucose, Bld: 100 mg/dL — ABNORMAL HIGH (ref 70–99)
Potassium: 3.7 mmol/L (ref 3.5–5.1)
Sodium: 138 mmol/L (ref 135–145)

## 2023-03-14 LAB — VITAMIN D 25 HYDROXY (VIT D DEFICIENCY, FRACTURES): Vit D, 25-Hydroxy: 24.43 ng/mL — ABNORMAL LOW (ref 30–100)

## 2023-03-14 MED ORDER — VITAMIN D (ERGOCALCIFEROL) 1.25 MG (50000 UNIT) PO CAPS
50000.0000 [IU] | ORAL_CAPSULE | ORAL | Status: DC
  Administered 2023-03-14: 50000 [IU] via ORAL
  Filled 2023-03-14: qty 1

## 2023-03-14 MED ORDER — IPRATROPIUM-ALBUTEROL 0.5-2.5 (3) MG/3ML IN SOLN
3.0000 mL | Freq: Four times a day (QID) | RESPIRATORY_TRACT | Status: DC
  Administered 2023-03-14 – 2023-03-15 (×5): 3 mL via RESPIRATORY_TRACT
  Filled 2023-03-14 (×6): qty 3

## 2023-03-14 NOTE — Plan of Care (Signed)
  Problem: Education: Goal: Knowledge of General Education information will improve Description: Including pain rating scale, medication(s)/side effects and non-pharmacologic comfort measures Outcome: Progressing   Problem: Pain Managment: Goal: General experience of comfort will improve and/or be controlled Outcome: Progressing   Problem: Safety: Goal: Ability to remain free from injury will improve Outcome: Progressing   Problem: Skin Integrity: Goal: Risk for impaired skin integrity will decrease Outcome: Progressing   Problem: Activity: Goal: Ability to tolerate increased activity will improve Outcome: Progressing

## 2023-03-14 NOTE — Progress Notes (Signed)
Mobility Specialist - Progress Note   03/14/23 1337  Mobility  Activity Transferred from bed to chair  Level of Assistance Standby assist, set-up cues, supervision of patient - no hands on  Assistive Device Front wheel walker  Distance Ambulated (ft) 2 ft  Activity Response Tolerated well  Mobility Referral Yes  Mobility visit 1 Mobility  Mobility Specialist Start Time (ACUTE ONLY) 1328  Mobility Specialist Stop Time (ACUTE ONLY) 1335  Mobility Specialist Time Calculation (min) (ACUTE ONLY) 7 min   Pt received in bed declining ambulation d/t SOB but agreeable to transfer to the recliner. No complaints during session. Pt to recliner after session with all needs met.    Pre-mobility: 91% SpO2  Chief Technology Officer

## 2023-03-14 NOTE — Progress Notes (Signed)
   03/14/23 1433  TOC Brief Assessment  Insurance and Status Reviewed  Patient has primary care physician Yes  Home environment has been reviewed Home w/ spouse  Prior level of function: Independent  Prior/Current Home Services No current home services  Social Drivers of Health Review SDOH reviewed no interventions necessary  Readmission risk has been reviewed Yes  Transition of care needs transition of care needs identified, TOC will continue to follow   TOC following for possible home O2 requirement.

## 2023-03-14 NOTE — Progress Notes (Signed)
[facility] PROGRESS NOTE  Austin [name] (DOB: 1940/06/14) ZOX:[id number] PCP: [facility]  Brief Narrative: Austin [name] is an 83 y.o. 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 He was found to be newly hypoxemic with wheezing, admitted for AECOPD.   Subjective: No significant change in his breathing from admission he says. No chest pain or other new thing. Looking forward to hearing from Dr. Truett [name] tomorrow (reportedly will be coming to his hospital room tomorrow).   Objective: BP (!) 154/83   Pulse 72   Temp (!) 97.2 F (36.2 C)   Resp 15   Ht 5\' 10"  (1.778 m)   Wt 46.6 kg   SpO2 98%   BMI 14.74 kg/m   Gen: Emaciated male in no acute distress Pulm: Tachypneic with slightly increased effort, speaking full sentences. Severely diminished without crackles or wheezes (got breathing treatment shortly before encounter)  CV: RRR, no MRG, distant, no edema GI: Soft, NT, ND, +BS Neuro: Alert and oriented. No new focal deficits. Ext: Warm, no deformities. Skin: No new rashes, lesions or ulcers on visualized skin. Right chest portacath is exquisitely well defined subcutaneously and nontender.   Assessment & Plan: Acute hypoxic respiratory failure due to AECOPD: May have COPD disease progression less acutely and now requires oxygen regardless of acute episode.  - Continue controller medications - Schedule duonebs and prn albuterol - Continue steroids. Wheezing improved on exam today per report.  - Empirically started on doxycycline at admission, will check PCT. - Continue supplemental oxygen to maintain normal  respiratory effort and SpO2 >88%.  Severe protein calorie malnutrition: Reports some vomiting and diarrhea but primarily losing weight due to not eating because everything tastes poorly. Has required holding and dose-reducing chemotherapy in the past.  - This is the primary cause for concern regarding his limited life expectancy, poor prognosis.   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 Due for follow up with Dr. Truett [name] who will review at GI tumor conference. Would be appropriate for palliative care consultation, will d/w oncology first.  CAD s/p PCI:  - Continue plavix, statin, metoprolol (cardioselective BB)  HTN:  - Continue metoprolol, losartan  Neutrophilia: Has been due to neulasta in the past, resolved. Afebrile.   Vitamin D deficiency:  - Start weekly 50k unit supplement  Austin Nine, MD [facility] [email or web address] 03/14/2023, 3:15 PM

## 2023-03-15 ENCOUNTER — Inpatient Hospital Stay (HOSPITAL_COMMUNITY): Payer: No Typology Code available for payment source

## 2023-03-15 ENCOUNTER — Inpatient Hospital Stay: Payer: Medicare (Managed Care)

## 2023-03-15 ENCOUNTER — Inpatient Hospital Stay: Payer: Medicare (Managed Care) | Admitting: Oncology

## 2023-03-15 DIAGNOSIS — J441 Chronic obstructive pulmonary disease with (acute) exacerbation: Secondary | ICD-10-CM | POA: Diagnosis not present

## 2023-03-15 LAB — BASIC METABOLIC PANEL
Anion gap: 4 — ABNORMAL LOW (ref 5–15)
BUN: 27 mg/dL — ABNORMAL HIGH (ref 8–23)
CO2: 31 mmol/L (ref 22–32)
Calcium: 8.8 mg/dL — ABNORMAL LOW (ref 8.9–10.3)
Chloride: 102 mmol/L (ref 98–111)
Creatinine, Ser: 0.59 mg/dL — ABNORMAL LOW (ref 0.61–1.24)
GFR, Estimated: >60 mL/min (ref >60)
Glucose, Bld: 86 mg/dL (ref 70–99)
Potassium: 3.8 mmol/L (ref 3.5–5.1)
Sodium: 137 mmol/L (ref 135–145)

## 2023-03-15 LAB — PROCALCITONIN: Procalcitonin: <0.1 ng/mL

## 2023-03-15 MED ORDER — PREDNISONE 20 MG PO TABS: 40.0000 mg | ORAL_TABLET | Freq: Every day | ORAL | 0 refills | Status: AC

## 2023-03-15 MED ORDER — VITAMIN D (ERGOCALCIFEROL) 1.25 MG (50000 UNIT) PO CAPS: 50000.0000 [IU] | ORAL_CAPSULE | ORAL | 0 refills | Status: DC

## 2023-03-15 MED ORDER — IOHEXOL 350 MG/ML SOLN
75.0000 mL | Freq: Once | INTRAVENOUS | Status: AC | PRN
  Administered 2023-03-15: 75 mL via INTRAVENOUS

## 2023-03-15 MED ORDER — HEPARIN SOD (PORK) LOCK FLUSH 100 UNIT/ML IV SOLN
500.0000 [IU] | INTRAVENOUS | Status: AC | PRN
  Administered 2023-03-15: 500 [IU]

## 2023-03-15 NOTE — Plan of Care (Signed)
Problem: Education: Goal: Knowledge of General Education information will improve Description: Including pain rating scale, medication(s)/side effects and non-pharmacologic comfort measures Outcome: Progressing   Problem: Clinical Measurements: Goal: Ability to maintain clinical measurements within normal limits will improve Outcome: Progressing Goal: Respiratory complications will improve Outcome: Progressing   Problem: Coping: Goal: Level of anxiety will decrease Outcome: Progressing

## 2023-03-15 NOTE — Progress Notes (Signed)
SATURATION QUALIFICATIONS: (This note is used to comply with regulatory documentation for home oxygen)  Patient Saturations on Room Air at Rest = 97%  Patient Saturations on Room Air while Ambulating = 88%  Patient Saturations on 4 Liters of oxygen while Ambulating = 90%  Please briefly explain why patient needs home oxygen:

## 2023-03-15 NOTE — Progress Notes (Signed)
Physical Therapy Treatment Patient Details Name: Austin [name] MRN: [id number] DOB: 1941-01-08 Today's Date: 03/15/2023   History of Present Illness Osborne Serio is an 83 yo male admitted with acute COPD exacerbation. PMH: COPD, CAD, GERD, hyperlipidemia,tobacco abuse, bowel cancer on chemotherapy last dose was 3 weeks ago    PT Comments  General Comments: AxO x 3 very HOH who lives home with his Spouse.  Hx COPD but was NOT on supplemental oxygen prior to admit.  Pt in bed on 3 lts sats 94%.  Assisted OOB to amb in hallway.  General Gait Details: poor forward flexed posture, narrow BOS.  Short shuffled steps.  Amb without any AD this session present with slight instability.  Rec walker for community use.  Pt required 4 lts oxygen to achieve sats 90% with avg HR 80.  Dyspnea 3/4.  VC's on purse lip breathing.  Returned to 3 lts. Pt plans to D/C back home with Spouse.  Has yet to be able to wean back to RA.    If plan is discharge home, recommend the following: Assistance with cooking/housework;Assist for transportation;Help with stairs or ramp for entrance   Can travel by private vehicle        Equipment Recommendations  None recommended by PT    Recommendations for Other Services       Precautions / Restrictions Precautions Precautions: Fall Precaution Comments: monitor sats, HX COPD Restrictions Weight Bearing Restrictions Per Provider Order: No     Mobility  Bed Mobility Overal bed mobility: Modified Independent             General bed mobility comments: self able with increased time    Transfers Overall transfer level: Needs assistance Equipment used: None Transfers: Sit to/from Stand Sit to Stand: Contact guard assist, Supervision           General transfer comment: no AD, BUE assisting to power up, slightly unsteady.  Poor forward flexed posture.    Ambulation/Gait Ambulation/Gait assistance: Contact guard assist, Supervision Gait Distance (Feet): 95  Feet Assistive device: None Gait Pattern/deviations: Step-through pattern, Decreased stride length, Trunk flexed, Narrow base of support Gait velocity: decreased     General Gait Details: poor forward flexed posture, narrow BOS.  Short shuffled steps.  Amb without any AD this session present with slight instability.  Rec walker for community use.  Pt required 4 lts oxygen to achieve sats 90% with avg HR 80.  Dyspnea 3/4.  VC's on purse lip breathing.  Returned to 3 lts.   Stairs             Wheelchair Mobility     Tilt Bed    Modified Rankin (Stroke Patients Only)       Balance                                            Cognition Arousal: Alert   Overall Cognitive Status: Within Functional Limits for tasks assessed                                 General Comments: AxO x 3 very HOH who lives home with his Spouse.  Hx COPD but was NOT on supplemental oxygen prior to admit.        Exercises      General Comments  Pertinent Vitals/Pain Pain Assessment Pain Assessment: No/denies pain    Home Living                          Prior Function            PT Goals (current goals can now be found in the care plan section) Progress towards PT goals: Progressing toward goals    Frequency    Min 1X/week      PT Plan      Co-evaluation              AM-PAC PT "6 Clicks" Mobility   Outcome Measure  Help needed turning from your back to your side while in a flat bed without using bedrails?: A Little Help needed moving from lying on your back to sitting on the side of a flat bed without using bedrails?: A Little Help needed moving to and from a bed to a chair (including a wheelchair)?: A Little Help needed standing up from a chair using your arms (e.g., wheelchair or bedside chair)?: A Little Help needed to walk in hospital room?: A Little Help needed climbing 3-5 steps with a railing? : A Little 6  Click Score: 18    End of Session Equipment Utilized During Treatment: Gait belt Activity Tolerance: Patient limited by fatigue Patient left: in bed;with call bell/phone within reach;with family/visitor present Nurse Communication: Mobility status PT Visit Diagnosis: Other abnormalities of gait and mobility (R26.89)     Time: 1030-1045 PT Time Calculation (min) (ACUTE ONLY): 15 min  Charges:    $Gait Training: 8-22 mins PT General Charges $$ ACUTE PT VISIT: 1 Visit                     Felecia Shelling  PTA Acute  Rehabilitation Services Office M-F          [phone]

## 2023-03-15 NOTE — TOC Transition Note (Signed)
Transition of Care [facility]) - Discharge Note   Patient Details  Name: Austin [name] MRN: [id number] Date of Birth: 1940-09-25  [facility]) CM/SW Contact:  Otelia Santee, LCSW Phone Number: 03/15/2023, 3:39 PM   Clinical Narrative:    Pt with new home O2 requirement. Spoke with pt who is agreeable tom oxygen being ordered. O2 has been ordered through [employer] and travel tank will be delivered to pt's room prior to discharge.    Final next level of care: Home/Self Care     Patient Goals and CMS Choice            Discharge Placement                       Discharge Plan and Services Additional resources added to the After Visit Summary for                  DME Arranged: Oxygen DME Agency: [employer] Date DME Agency Contacted: 03/15/23 Time DME Agency Contacted: 1539 Representative spoke with at DME Agency: Mitch            Social Drivers of Health (SDOH) Interventions SDOH Screenings   Food Insecurity: No Food Insecurity (03/12/2023)  Housing: Low Risk  (03/12/2023)  Transportation Needs: No Transportation Needs (03/12/2023)  Utilities: Not At Risk (03/12/2023)  Financial Resource Strain: Low Risk  (07/18/2022)  Social Connections: Unknown (03/12/2023)  Tobacco Use: Medium Risk (03/12/2023)     Readmission Risk Interventions    03/14/2023    2:33 PM  Readmission Risk Prevention Plan  Transportation Screening Complete  PCP or Specialist Appt within 5-7 Days Complete  Home Care Screening Complete  Medication Review (RN CM) Complete

## 2023-03-15 NOTE — Plan of Care (Signed)

## 2023-03-15 NOTE — Progress Notes (Signed)
IP PROGRESS NOTE  Subjective:   Mr. Ide is well-known to me with a history of metastatic small bowel cancer, currently maintained off treatment.  He presented to the emergency room on 03/12/2023 with increased shortness of breath.  He was admitted for treatment of a COPD exacerbation.  He reports a 2-week history of progressive dyspnea prior to hospital admission.  The dyspnea is improved. No nausea/vomiting or abdominal pain.  He has a poor appetite.  His wife is at the bedside when I saw him at approximately 7 AM this morning.  Objective: Vital signs in last 24 hours: Blood pressure (!) 158/92, pulse 70, temperature 97.6 F (36.4 C), temperature source Oral, resp. rate 18, height 5\' 10"  (1.778 m), weight 102 lb 11.8 oz (46.6 kg), SpO2 97%.  Intake/Output from previous day: 01/29 0701 - 01/30 0700 In: -  Out: 300 [Urine:300]  Physical Exam:  HEENT: The tongue is dry with thick secretions, no thrush Lungs: Distant breath sounds, increased respiratory rate Cardiac: Regular rate and rhythm, distant heart sounds Abdomen: Nontender, no mass, no hepatosplenomegaly, no apparent ascites Extremities: No leg edema   Portacath/PICC-without erythema  Lab Results: Recent Labs    03/12/23 1918 03/13/23 0321  WBC 13.7* 7.7  HGB 12.8* 12.4*  HCT 39.7 38.3*  PLT 199 191    BMET Recent Labs    03/14/23 0447 03/15/23 0324  NA 138 137  K 3.7 3.8  CL 97* 102  CO2 32 31  GLUCOSE 100* 86  BUN 32* 27*  CREATININE 0.32* 0.59*  CALCIUM 9.2 8.8*    Lab Results  Component Value Date   CEA1 5.1 (H) 07/01/2022   CEA 6.15 (H) 02/13/2023    Studies/Results: No results found.  Medications: I have reviewed the patient's current medications.  Assessment/Plan: Small bowel carcinoma-stage IV (pT4,pN1,pM1 Admission 06/26/2022 with a small bowel obstruction CT abdomen/pelvis 06/26/2022-small bowel obstruction with transition in the lower abdomen/pelvis, irregular nodular densities in  the right lower lobe CT angiogram abdomen/pelvis 06/27/2022-circumferential masslike thickening at the terminal ileum, progressive peritoneal soft tissue at the right paracolic gutter, increase in a posterior lateral left bladder mass, resolution of small bowel dilation, increased distal esophageal wall thickening compared to October 2023 06/30/2022-exploratory laparotomy, right hemicolectomy, and peritoneal biopsy-firm mass at the distal ileum with peritoneal implants-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, tumor perforates the visceral peritoneum and invades the cecum, negative resection margins, 2/13 nodes, 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  No evidence of progressive disease. Cycle 4 FOLFOX 11/30/2022, oxaliplatin dose reduced Cycle 5 FOLFOX 12/19/2022, oxaliplatin dose reduced due to neutropenia, Neulasta Cycle 6 FOLFOX 01/02/2023, Neulasta held secondary to neutrophilia Cycle 7 FOLFOX 01/16/2023, Neulasta Cycle 8 FOLFOX 01/30/2023, Neulasta held secondary to neutrophilia CTs 02/12/2023: Slight increase in nodular omental thickening and ascites, stable pulmonary nodules-favor benign   2.  COPD 3.  CAD 4.  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  Enlarging bladder mass on CT 06/27/2022 6.  Mother with cervical cancer 7.  Port-A-Cath placement 10/02/2022 8.   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  Admission 03/12/2023 with a COPD flare     Mr. Cherry has metastatic bowel carcinoma with abdominal carcinomatosis.  He is now admitted with a COPD flare.  The current hospital admission appears unrelated to the small bowel carcinoma.  He has been maintained off of systemic therapy for the past month.  There is no clinical evidence for progression of small bowel carcinoma.  I discussed treatment options with Mr. Carrozza and his wife.  We discussed comfort/supportive care, "maintenance "therapy with Xeloda/bevacizumab, and changing to FOLFIRI/bevacizumab.  We reviewed potential toxicities associated with bevacizumab and irinotecan.  We discussed the chance of an allergic reaction, hypertension, bleeding, thromboembolic disease, bowel perforation, delayed wound healing, CNS toxicity, and nephrotoxicity with bevacizumab.  We reviewed the hematologic toxicity, nausea/vomiting, bleeding, infection, alopecia, and acute/delayed diarrhea seen with irinotecan.  We also discussed hospice care.  He qualifies for hospice based on the small bowel carcinoma diagnosis and COPD.  It is unclear whether his life limiting diagnosis will be the cancer or COPD.  We discussed CPR and ACLS.  He is undecided on CODE STATUS at present.  Recommendations: Continue management of COPD per the medical service, he will need outpatient follow-up with pulmonary medicine or primary care Outpatient follow-up will be scheduled at the Cancer center in 2-3 weeks to continue discussions regarding management of the metastatic small bowel carcinoma Please call oncology as needed      LOS: 2 days   Thornton Papas, MD   03/15/2023, 8:15 AM

## 2023-03-15 NOTE — Discharge Summary (Signed)
Physician Discharge Summary   Patient: Austin [name] MRN: [id number] DOB: February 11, 1941  Admit date:     03/12/2023  Discharge date: 03/15/23  Discharge Physician: Tyrone Nine   PCP: Center, [facility]   Recommendations at discharge:  Follow up with PCP and/or pulmonary after discharge for ongoing management of COPD. Starting on supplemental oxygen at discharge due to severe emphysema.  Follow up with oncology in 2-3 weeks to continue discussions regarding management of metastatic small bowel carcinoma.  Continue palliative care discussions going forward as patient has 2 life-limiting conditions and remains full code.  Consider repeat vitamin D testing in 8 weeks (supplement started as discussed below).   Discharge Diagnoses: Principal Problem:   COPD with acute exacerbation (HCC) Active Problems:   HLD (hyperlipidemia)   Dehydration   TOBACCO ABUSE   Essential hypertension   CAD (coronary artery disease)   GERD   Cecal cancer (HCC)   Small bowel carcinoma [facility])  Hospital Course: Austin [name] is an 83 y.o. 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 He was found to be newly hypoxemic with wheezing, admitted for AECOPD. CXR initially and follow up CTA chest showed no acute pulmonary findings. He has had no evidence of ongoing bronchospasm but remains hypoxemic, and has maximized benefit of inpatient management. He will be discharged with 4L supplemental oxygen and plans for outpatient pulmonary, PCP, and oncology follow up.   Assessment and Plan: Acute hypoxic respiratory failure due to AECOPD: May have COPD disease progression  less acutely and now requires oxygen regardless of acute episode.  - Continue controller medications  - Continue steroids. Wheezing resolved > 24 hours. - Empirically started on doxycycline at admission, PCT negative, no sputum, and no infiltrate on CXR nor CTA chest which also showed no PE, pulmonary edema, nor effusions.  - Continue supplemental oxygen to maintain normal respiratory effort and SpO2 >88%. Requires 4L with exertion. He and his spouse are requesting discharge on supplemental oxygen in lieu of additional inpatient treatments.    Severe protein calorie malnutrition: Reports some vomiting and diarrhea but primarily losing weight due to not eating because everything tastes poorly. Has required holding and dose-reducing chemotherapy in the past.  - This is the primary cause for concern regarding his limited life expectancy, poor prognosis.    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 Followed by Dr. Truett Perna as outpatient who has seen him this morning. Would be appropriate for palliative care consultation, though will continue discussions with Dr. Truett Perna in 2-3 weeks.    CAD s/p PCI:  - Continue plavix, statin, metoprolol (cardioselective BB)   HTN:  - Continue metoprolol, losartan   Neutrophilia: Has been due to neulasta in the past, resolved. Afebrile.    Vitamin D deficiency:  - Start weekly 50k unit supplement  Consultants: Oncology Procedures performed: None  Disposition: Home Diet recommendation:  Cardiac diet DISCHARGE MEDICATION: Allergies as of 03/15/2023       Reactions   Ramipril Cough        Medication List     TAKE these medications    albuterol 108 (90 Base) MCG/ACT inhaler Commonly known as: VENTOLIN HFA Inhale 2 puffs into the  lungs every 4 (four) hours as needed for wheezing or shortness of breath.   atorvastatin 40 MG tablet Commonly known as: LIPITOR Take 1 tablet (40 mg total) by mouth at bedtime. Please keep scheduled  appointment for future refills. Thank you. What changed: additional instructions   clopidogrel 75 MG tablet Commonly known as: PLAVIX Take 1 tablet (75 mg total) by mouth daily. Please keep scheduled appointment for future refills. Thank you. What changed: additional instructions   Ensure Plus Liqd Take 237 mLs by mouth 2 (two) times daily as needed (for supplementation).   lidocaine-prilocaine cream Commonly known as: EMLA Apply 1 Application topically as needed (Apply to port site 1 hour prior to appt, start using with second treatment).   losartan 25 MG tablet Commonly known as: COZAAR Take 1 tablet (25 mg total) by mouth daily. Please keep scheduled appointment for future refills. Thank you. What changed: additional instructions   metoprolol succinate 25 MG 24 hr tablet Commonly known as: TOPROL-XL TAKE 1 TABLET DAILY   mometasone-formoterol 100-5 MCG/ACT Aero Commonly known as: DULERA Inhale 2 puffs into the lungs 2 (two) times daily as needed for shortness of breath or wheezing.   nitroGLYCERIN 0.4 MG SL tablet Commonly known as: NITROSTAT Place 1 tablet (0.4 mg total) under the tongue as directed.   NON FORMULARY Take 2 capsules by mouth See admin instructions. Balance of [employer] Supplements- Take 2 capsules OF EACH FORMULATION by mouth once a day   NON FORMULARY Take 2 Scoops by mouth See admin instructions. Balance of Nature Fiber & SpiceT Supplement: Mix 2 scoopsful of powder into 16 ounces of water and drink by mouth once a day   ondansetron 8 MG tablet Commonly known as: ZOFRAN Take 4-8 mg by mouth every 8 (eight) hours as needed for nausea or vomiting.   polyethylene glycol 17 g packet Commonly known as: MIRALAX / GLYCOLAX Take 17 g by mouth daily as needed for moderate constipation or severe constipation (if not helped with colace).   predniSONE 20 MG tablet Commonly known as: DELTASONE Take 2 tablets (40 mg total) by mouth daily with  breakfast for 2 days. Start taking on: March 16, 2023   prochlorperazine 5 MG tablet Commonly known as: COMPAZINE Take 1 tablet (5 mg total) by mouth every 6 (six) hours as needed for nausea or vomiting.   Spiriva Respimat 2.5 MCG/ACT Aers Generic drug: Tiotropium Bromide Monohydrate Inhale 2 puffs into the lungs daily.   traMADol 50 MG tablet Commonly known as: ULTRAM Take 1 tablet (50 mg total) by mouth every 6 (six) hours as needed for moderate pain or severe pain.   triamcinolone ointment 0.1 % Commonly known as: KENALOG Apply 1 Application topically 2 (two) times daily as needed (for dryness- thin layer).   Vitamin D (Ergocalciferol) 1.25 MG (50000 UNIT) Caps capsule Commonly known as: DRISDOL Take 1 capsule (50,000 Units total) by mouth every 7 (seven) days for 8 doses. Start taking on: March 21, 2023               Durable Medical Equipment  (From admission, onward)           Start     Ordered   03/15/23 1532  DME Oxygen  Once       Question Answer Comment  Length of Need Lifetime   Mode or (Route) Nasal cannula   Liters per Minute 4   Frequency Continuous (stationary and portable oxygen unit needed)  Oxygen delivery system Gas      03/15/23 1534            Follow-up Information     Center, [facility]. Schedule an appointment as soon as possible for a visit.   Specialty: General Practice Contact information: [address] [place] [address] [phone]         Ladene Artist, MD Follow up.   Specialty: Oncology Contact information: [address] [place] [address] [phone]                Discharge Exam: Filed Weights   03/12/23 1113  Weight: 46.6 kg  BP (!) 152/92 (BP Location: Right Arm)   Pulse 78   Temp 98.1 F (36.7 C) (Oral)   Resp 20   Ht 5\' 10"  (1.778 m)   Wt 46.6 kg   SpO2 96%   BMI 14.74 kg/m   Gen: Frail emaciated male in no acute distress Pulm: Tachypneic with slightly  increased effort, speaking full sentences. Severely diminished without crackles or wheezes. CV: RRR, no MRG, distant, no edema GI: Soft, NT, ND, +BS Neuro: Alert and oriented. No new focal deficits. Ext: Warm, no deformities. Skin: No new rashes, lesions or ulcers on visualized skin. Right chest portacath is exquisitely well defined subcutaneously and nontender.   Condition at discharge: stable  The results of significant diagnostics from this hospitalization (including imaging, microbiology, ancillary and laboratory) are listed below for reference.   Imaging Studies: CT Angio Chest Pulmonary Embolism (PE) W or WO Contrast Result Date: 03/15/2023 CLINICAL DATA:  Shortness of breath and hypoxia. Concern for pulmonary embolism. EXAM: CT ANGIOGRAPHY CHEST WITH CONTRAST TECHNIQUE: Multidetector CT imaging of the chest was performed using the standard protocol during bolus administration of intravenous contrast. Multiplanar CT image reconstructions and MIPs were obtained to evaluate the vascular anatomy. RADIATION DOSE REDUCTION: This exam was performed according to the departmental dose-optimization program which includes automated exposure control, adjustment of the mA and/or kV according to patient size and/or use of iterative reconstruction technique. CONTRAST:  75mL OMNIPAQUE IOHEXOL 350 MG/ML SOLN COMPARISON:  Chest radiograph dated 03/12/2023. FINDINGS: Evaluation of this exam is limited due to respiratory motion. Cardiovascular: There is no cardiomegaly or pericardial effusion. There is 3 vessel coronary vascular calcification. Right-sided Port-A-Cath with tip in the right atrium. Moderate atherosclerotic calcification of the thoracic aorta. No aneurysmal dilatation or dissection. Evaluation of the pulmonary arteries is limited due to respiratory motion and suboptimal visualization of the peripheral branches. No pulmonary artery embolus identified. Mediastinum/Nodes: No hilar or mediastinal  adenopathy. The esophagus is grossly unremarkable. No mediastinal fluid collection. Lungs/Pleura: Severe emphysema. There are biapical subpleural scarring. Similar appearance of right apical subpleural nodular density measuring 9 mm in thickness most consistent with scarring. Linear and bandlike scarring in the left upper lobe. Small left upper lobe nodules measure up to 5 mm similar to prior CT. No new nodule. No consolidative changes. There is no pleural effusion or pneumothorax. The central airways are patent. Upper Abdomen: Small ascites. Musculoskeletal: Loss of subcutaneous fat and cachexia. Osteoporosis with degenerative changes. Compression fracture of T3 vertebra with near complete loss of vertebral body height anteriorly and anterior wedging, new since the prior CT, and acute appearing. Correlation with clinical exam and point tenderness recommended. There is resulting upper thoracic kyphosis. Acute fractures of the left T3 transverse process and lamina. No significant retropulsion. Review of the MIP images confirms the above findings. IMPRESSION: 1. No CT evidence of pulmonary  artery embolus. 2. Acute appearing compression fracture of T3 vertebra with near complete loss of vertebral body height anteriorly and anterior wedging. Acute fractures of the left T3 transverse process and lamina. Correlation with point tenderness recommended. 3. Aortic Atherosclerosis (ICD10-I70.0) and Emphysema (ICD10-J43.9). Electronically Signed   By: Elgie Collard M.D.   On: 03/15/2023 13:27   DG Chest Port 1 View Result Date: 03/12/2023 CLINICAL DATA:  Shortness of breath for 3 days. Labored breathing with hypoxemia. EXAM: PORTABLE CHEST 1 VIEW COMPARISON:  Radiographs 11/16/2021 and 04/21/2015.  CT 02/12/2023. FINDINGS: 1157 hours. Right IJ Port-A-Cath extends to the level of the mid right atrium. The heart size and mediastinal contours are stable. The lungs are hyperinflated but clear. No evidence of pleural effusion  or pneumothorax. No acute osseous findings are seen. Telemetry leads overlie the chest. IMPRESSION: No evidence of acute cardiopulmonary process. Chronic obstructive pulmonary disease. Electronically Signed   By: Carey Bullocks M.D.   On: 03/12/2023 12:17    Microbiology: Results for orders placed or performed during the hospital encounter of 03/12/23  Resp panel by RT-PCR (RSV, Flu A&B, Covid) Anterior Nasal Swab     Status: None   Collection Time: 03/12/23 12:19 PM   Specimen: Anterior Nasal Swab  Result Value Ref Range Status   SARS Coronavirus 2 by RT PCR NEGATIVE NEGATIVE Final    Comment: (NOTE) SARS-CoV-2 target nucleic acids are NOT DETECTED.  The SARS-CoV-2 RNA is generally detectable in upper respiratory specimens during the acute phase of infection. The lowest concentration of SARS-CoV-2 viral copies this assay can detect is 138 copies/mL. A negative result does not preclude SARS-Cov-2 infection and should not be used as the sole basis for treatment or other patient management decisions. A negative result may occur with  improper specimen collection/handling, submission of specimen other than nasopharyngeal swab, presence of viral mutation(s) within the areas targeted by this assay, and inadequate number of viral copies(<138 copies/mL). A negative result must be combined with clinical observations, patient history, and epidemiological information. The expected result is Negative.  Fact Sheet for Patients:  BloggerCourse.com  Fact Sheet for Healthcare Providers:  SeriousBroker.it  This test is no t yet approved or cleared by the [place] FDA and  has been authorized for detection and/or diagnosis of SARS-CoV-2 by FDA under an Emergency Use Authorization (EUA). This EUA will remain  in effect (meaning this test can be used) for the duration of the COVID-19 declaration under Section 564(b)(1) of the Act,  21 U.S.C.section 360bbb-3(b)(1), unless the authorization is terminated  or revoked sooner.       Influenza A by PCR NEGATIVE NEGATIVE Final   Influenza B by PCR NEGATIVE NEGATIVE Final    Comment: (NOTE) The Xpert Xpress SARS-CoV-2/FLU/RSV plus assay is intended as an aid in the diagnosis of influenza from Nasopharyngeal swab specimens and should not be used as a sole basis for treatment. Nasal washings and aspirates are unacceptable for Xpert Xpress SARS-CoV-2/FLU/RSV testing.  Fact Sheet for Patients: BloggerCourse.com  Fact Sheet for Healthcare Providers: SeriousBroker.it  This test is not yet approved or cleared by the [place] FDA and has been authorized for detection and/or diagnosis of SARS-CoV-2 by FDA under an Emergency Use Authorization (EUA). This EUA will remain in effect (meaning this test can be used) for the duration of the COVID-19 declaration under Section 564(b)(1) of the Act, 21 U.S.C. section 360bbb-3(b)(1), unless the authorization is terminated or revoked.     Resp Syncytial Virus by PCR  NEGATIVE NEGATIVE Final    Comment: (NOTE) Fact Sheet for Patients: BloggerCourse.com  Fact Sheet for Healthcare Providers: SeriousBroker.it  This test is not yet approved or cleared by the [place] FDA and has been authorized for detection and/or diagnosis of SARS-CoV-2 by FDA under an Emergency Use Authorization (EUA). This EUA will remain in effect (meaning this test can be used) for the duration of the COVID-19 declaration under Section 564(b)(1) of the Act, 21 U.S.C. section 360bbb-3(b)(1), unless the authorization is terminated or revoked.  Performed at [facility], [address], [place], [place] [address]     Labs: CBC: Recent Labs  Lab 03/12/23 1219 03/12/23 1918 03/13/23 0321  WBC 21.6* 13.7* 7.7  HGB 14.1 12.8*  12.4*  HCT 42.4 39.7 38.3*  MCV 101.7* 105.0* 104.4*  PLT 233 199 191   Basic Metabolic Panel: Recent Labs  Lab 03/12/23 1219 03/12/23 1918 03/13/23 0321 03/14/23 0447 03/15/23 0324  NA 141  --  139 138 137  K 3.8  --  3.6 3.7 3.8  CL 100  --  101 97* 102  CO2 33*  --  30 32 31  GLUCOSE 150*  --  198* 100* 86  BUN 29*  --  33* 32* 27*  CREATININE 0.66 0.72 0.69 0.32* 0.59*  CALCIUM 9.3  --  9.0 9.2 8.8*   Liver Function Tests: Recent Labs  Lab 03/12/23 1219 03/13/23 0321  AST 29 26  ALT 31 27  ALKPHOS 112 99  BILITOT 0.5 0.7  PROT 6.5 5.9*  ALBUMIN 4.0 3.2*   CBG: No results for input(s): "GLUCAP" in the last 168 hours.  Discharge time spent: greater than 30 minutes.  Signed: Tyrone Nine, MD [facility] 03/15/2023

## 2023-03-16 ENCOUNTER — Other Ambulatory Visit: Payer: Self-pay | Admitting: *Deleted

## 2023-03-16 LAB — ZINC: Zinc: 46 ug/dL (ref 44–115)

## 2023-03-16 NOTE — Progress Notes (Signed)
Per Truett Perna: Being d/c from hospital. Needs f/u with him in 1-2 weeks (no lab). Scheduler notified.

## 2023-03-21 ENCOUNTER — Telehealth: Payer: Self-pay

## 2023-03-21 NOTE — Telephone Encounter (Signed)
 [facility] CSW Progress Note  Clinical Child Psychotherapist contacted patient's spouse to introduce self / role, talk about advance directives, and support services. Patient and Spouse have an appointment to complete Will / POA documents late this day, CSW provided brief education on AD and encouraged patient's spouse to ensure those documents are completed as well. Patient spouse reported patient is not feeling to well and has upcoming appointment with provider. CSW scheduled follow up call with patient's spouse to provide additional support.   Lizbeth Sprague, LCSW Clinical Social Worker [facility]

## 2023-03-26 ENCOUNTER — Encounter: Payer: Self-pay | Admitting: Oncology

## 2023-03-26 ENCOUNTER — Other Ambulatory Visit (HOSPITAL_COMMUNITY): Payer: Self-pay

## 2023-03-26 ENCOUNTER — Inpatient Hospital Stay: Payer: Medicare (Managed Care) | Attending: Oncology | Admitting: Oncology

## 2023-03-26 ENCOUNTER — Telehealth: Payer: Self-pay | Admitting: Pharmacist

## 2023-03-26 VITALS — BP 116/86 | HR 83 | Temp 98.2°F | Resp 18 | Ht 70.0 in | Wt 90.6 lb

## 2023-03-26 DIAGNOSIS — C172 Malignant neoplasm of ileum: Secondary | ICD-10-CM | POA: Diagnosis present

## 2023-03-26 DIAGNOSIS — I2510 Atherosclerotic heart disease of native coronary artery without angina pectoris: Secondary | ICD-10-CM | POA: Insufficient documentation

## 2023-03-26 DIAGNOSIS — J449 Chronic obstructive pulmonary disease, unspecified: Secondary | ICD-10-CM | POA: Diagnosis not present

## 2023-03-26 DIAGNOSIS — Z5112 Encounter for antineoplastic immunotherapy: Secondary | ICD-10-CM | POA: Insufficient documentation

## 2023-03-26 DIAGNOSIS — C179 Malignant neoplasm of small intestine, unspecified: Secondary | ICD-10-CM

## 2023-03-26 MED ORDER — CAPECITABINE 500 MG PO TABS: ORAL_TABLET | ORAL | 0 refills | Status: DC

## 2023-03-26 MED ORDER — CAPECITABINE 500 MG PO TABS
ORAL_TABLET | ORAL | 0 refills | Status: DC
  Filled 2023-04-02: qty 70, 21d supply, fill #0

## 2023-03-26 NOTE — Progress Notes (Signed)
 [facility] OFFICE PROGRESS NOTE   Diagnosis: Small bowel carcinoma  INTERVAL HISTORY:   Austin [name] was discharged from the hospital 03/15/2023 after admission with a COPD exacerbation.  He reports improvement in dyspnea.  He has a good appetite.  No difficulty with bowel function.  He continues smoking approximately 3 cigarettes/day.  His wife died unexpectedly on Apr 01, 2023.  He is here today with his sons.  Objective:  Vital signs in last 24 hours:  Blood pressure 116/86, pulse 83, temperature 98.2 F (36.8 C), temperature source Temporal, resp. rate 18, height 5\' 10"  (1.778 m), weight 90 lb 9.6 oz (41.1 kg), SpO2 98%.   Resp: Distant breath sounds, no respiratory distress Cardio: Regular rate and rhythm GI: No apparent ascites, no mass, no hepatosplenomegaly, nontender Vascular: No leg edema  Portacath/PICC-without erythema  Lab Results:  Lab Results  Component Value Date   WBC 7.7 03/13/2023   HGB 12.4 (L) 03/13/2023   HCT 38.3 (L) 03/13/2023   MCV 104.4 (H) 03/13/2023   PLT 191 03/13/2023   NEUTROABS 2.2 02/13/2023    CMP  Lab Results  Component Value Date   NA 137 03/15/2023   K 3.8 03/15/2023   CL 102 03/15/2023   CO2 31 03/15/2023   GLUCOSE 86 03/15/2023   BUN 27 (H) 03/15/2023   CREATININE 0.59 (L) 03/15/2023   CALCIUM  8.8 (L) 03/15/2023   PROT 5.9 (L) 03/13/2023   ALBUMIN  3.2 (L) 03/13/2023   AST 26 03/13/2023   ALT 27 03/13/2023   ALKPHOS 99 03/13/2023   BILITOT 0.7 03/13/2023   GFRNONAA >60 03/15/2023   GFRAA  11/06/2008    >60        The eGFR has been calculated using the MDRD equation. This calculation has not been validated in all clinical situations. eGFR's persistently <60 mL/min signify possible Chronic Kidney Disease.    Lab Results  Component Value Date   CEA1 5.1 (H) 07/01/2022   CEA 6.15 (H) 02/13/2023   Medications: I have reviewed the patient's current medications.   Assessment/Plan: Small bowel  carcinoma-stage IV (pT4,pN1,pM1 Admission 06/26/2022 with a small bowel obstruction CT abdomen/pelvis 06/26/2022-small bowel obstruction with transition in the lower abdomen/pelvis, irregular nodular densities in the right lower lobe CT angiogram abdomen/pelvis 06/27/2022-circumferential masslike thickening at the terminal ileum, progressive peritoneal soft tissue at the right paracolic gutter, increase in a posterior lateral left bladder mass, resolution of small bowel dilation, increased distal esophageal wall thickening compared to October 2023 06/30/2022-exploratory laparotomy, right hemicolectomy, and peritoneal biopsy-firm mass at the distal ileum with peritoneal implants-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, tumor perforates the visceral peritoneum and invades the cecum, negative resection margins, 2/13 nodes, 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  No evidence of progressive disease. Cycle 4 FOLFOX 11/30/2022, oxaliplatin  dose reduced Cycle 5 FOLFOX 12/19/2022, oxaliplatin  dose reduced due to neutropenia, Neulasta  Cycle 6 FOLFOX 01/02/2023, Neulasta  held secondary to neutrophilia Cycle 7 FOLFOX 01/16/2023, Neulasta  Cycle 8 FOLFOX 01/30/2023, Neulasta  held secondary to neutrophilia CTs 02/12/2023: Slight increase in nodular omental thickening and ascites, stable pulmonary  nodules-favor benign   2.  COPD 3.  CAD 4.  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  Enlarging bladder mass on CT 06/27/2022 6.  Mother with cervical cancer 7.  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  Admission 03/12/2023 with a COPD flare       Disposition: [name] appears stable.  There is no clinical evidence for progression of the small bowel carcinoma.  He appears asymptomatic from the cancer unless he is anorexia is related.  He understands no therapy will be curative.  Goals of treating the cancer include improvement in cancer related symptom and increasing survival.  No therapy will be curative.  We discussed observation, capecitabine /bevacizumab , and FOLFIRI.  He understands the difficulty in measuring response to therapy given the lack of easily measured disease on CT.  [name] prefers beginning treatment as opposed to observation or hospice care.  He would like to begin a trial of Xeloda /Avastin .  We reviewed potential toxicities associated with Xeloda  including the chance of mucositis, diarrhea, rash, hyperpigmentation, and hand/foot syndrome.  We discussed the allergic reaction, hypertension, bleeding, thromboembolism, bowel perforation, delayed wound healing, nephrotoxicity, and CNS toxicity associated with bevacizumab .  He agrees to proceed.  A treatment plan was entered today.  He will return for an office visit on 04/13/2023 with the plan to begin treatment that day. We will work on obtaining treatment approval from the [place]. Coni Deep, MD  03/26/2023  11:08 AM

## 2023-03-26 NOTE — Progress Notes (Signed)
 DISCONTINUE OFF PATHWAY REGIMEN - Other   [id number]:mFOLFOX6 (Leucovorin  IV D1 + Fluorouracil  IV D1/CIV D1,2 + Oxaliplatin  IV D1) q14 Days:   A cycle is every 14 days:     Oxaliplatin       Leucovorin       Fluorouracil       Fluorouracil    **Always confirm dose/schedule in your pharmacy ordering system**  PRIOR TREATMENT: [id number] (Leucovorin  IV D1 + Fluorouracil  IV D1/CIV D1,2 + Oxaliplatin  IV D1) q14 Days  START OFF PATHWAY REGIMEN - Other   [id number]:Bevacizumab  7.5 mg/kg IV D1 + Capecitabine  1,250 mg/m2 PO BID D1-14 q21 Days:   A cycle is every 21 days:     Capecitabine       Bevacizumab -xxxx   **Always confirm dose/schedule in your pharmacy ordering system**  Patient Characteristics: Intent of Therapy: Non-Curative / Palliative Intent, Discussed with Patient

## 2023-03-26 NOTE — Telephone Encounter (Addendum)
 Per CMM when PA was attempted, "Drug is covered by current benefit plan. No further PA activity needed"  [employer] (primary) and [employer] (secondary) copay: $39.53  Of note, awaiting returned call from the [place] to determine if patient has referral from the [place] to be seen at [facility].

## 2023-03-26 NOTE — Telephone Encounter (Addendum)
 Clinical Pharmacist Practitioner Encounter   Received new prescription for Xeloda  (capecitabine ) for the treatment of stage IV small bowel carcinoma in conjunction with bevacizumab , planned duration until disease progression or unacceptable drug toxicity. Planned start on 04/12/23.   BMP from 03/15/23 assessed, no relevant lab abnormalities. Patient's CrCl ~61ml/min, his dose is appropriate for his renal impairment. Prescription dose and frequency assessed.   Current medication list in Epic reviewed, no DDIs with capecitabine  identified.   Evaluated chart and no patient barriers to medication adherence identified.   Prescription has been e-scribed to the [facility] for benefits analysis and approval.  Oral Oncology Clinic will continue to follow for insurance authorization, copayment issues, initial counseling and start date.  Patient agreed to treatment on 03/26/23 per MD documentation.  Loukas Antonson N. Silvester Reierson, PharmD, BCOP, CPP Hematology/Oncology Clinical Pharmacist [facility] Oral Chemotherapy Navigation [facility] [phone]  03/26/2023 2:06 PM

## 2023-04-02 ENCOUNTER — Other Ambulatory Visit: Payer: Self-pay | Admitting: Pharmacy Technician

## 2023-04-02 ENCOUNTER — Other Ambulatory Visit: Payer: Self-pay

## 2023-04-02 ENCOUNTER — Other Ambulatory Visit (HOSPITAL_COMMUNITY): Payer: Self-pay

## 2023-04-02 NOTE — Progress Notes (Signed)
Patient education documented in EPIC note on 04/02/23.

## 2023-04-02 NOTE — Progress Notes (Signed)
Specialty Pharmacy Initial Fill Coordination Note  Austin [name] is a 83 y.o. male contacted today regarding refills of specialty medication(s) Capecitabine (XELODA) .  Patient requested Delivery  on 04/05/23  to verified address [address] [facility] [place] [address]   Medication will be filled on 02/18.   Patient is aware of $39.53 copayment.   Patty Almedia Balls, CPhT Oncology Pharmacy Patient Advocate [facility] Direct Number: [phone] Fax: [phone]

## 2023-04-02 NOTE — Telephone Encounter (Signed)
Clinical Pharmacist Practitioner Encounter   [facility] (Specialty) will deliver medication to patient on 04/05/23. He knows not to start until 04/12/23.   Patient Education I spoke with patient for overview of new oral chemotherapy medication: Xeloda (capecitabine) for the treatment of stage IV small bowel carcinoma in conjunction with bevacizumab, planned duration until disease progression or unacceptable drug toxicity. Planned start on 04/12/23.    Counseled patient on administration, dosing, side effects, monitoring, drug-food interactions, safe handling, storage, and disposal. Patient will take 3 tablets (1500mg ) by mouth in AM and 2 tablets (1000mg ) in PM. Take with food. Take for 14 days, then hold for 7 days. Repeat every 21 days .  Side effects include but not limited to: diarrhea, hand-foot syndrome, mouth sores, edema, decreased wbc, fatigue, N/V Diarrhea: Patient knows to use loperamide as needed and call the office if they are having 4 or more loose stools per day. Hand-foot syndrome: Recommended the use of [place]der[place] Smooth Extra Care 20. Patient knows to report any skin changes they notice.  Mouth sores: Patient knows to request magic mouthwash if needed    Reviewed with patient importance of keeping a medication schedule and plan for any missed doses.  After discussion with patient no patient barriers to medication adherence identified.   [name] voiced understanding and appreciation. All questions answered. Medication handout provided.  Provided patient with Oral Chemotherapy Navigation Clinic phone number. Patient knows to call the office with questions or concerns. Oral Chemotherapy Navigation Clinic will continue to follow.  Remi Haggard, PharmD, BCOP, CPP Hematology/Oncology Clinical Pharmacist [facility] Oral Chemotherapy Navigation [facility] [phone]  04/02/2023 1:50 PM

## 2023-04-03 ENCOUNTER — Telehealth: Payer: Self-pay

## 2023-04-03 ENCOUNTER — Other Ambulatory Visit: Payer: Self-pay

## 2023-04-03 ENCOUNTER — Other Ambulatory Visit (HOSPITAL_COMMUNITY): Payer: Self-pay

## 2023-04-03 ENCOUNTER — Encounter: Payer: Self-pay | Admitting: Oncology

## 2023-04-03 NOTE — Telephone Encounter (Signed)
Left voicemail to notify patient's son that his FMLA documents had been completed and faxed back to company. Fax confirmation received. Copy of documents mailed to patient's address on file.

## 2023-04-03 NOTE — Progress Notes (Signed)
Patient was contacted via mychart that due to possible impending winter storm, medication will arrive on Wednesday 04/04/23

## 2023-04-06 ENCOUNTER — Other Ambulatory Visit: Payer: Self-pay | Admitting: Oncology

## 2023-04-12 ENCOUNTER — Inpatient Hospital Stay: Payer: Medicare (Managed Care)

## 2023-04-12 ENCOUNTER — Inpatient Hospital Stay (HOSPITAL_BASED_OUTPATIENT_CLINIC_OR_DEPARTMENT_OTHER): Payer: Medicare (Managed Care) | Admitting: Oncology

## 2023-04-12 VITALS — BP 129/77 | HR 78 | Temp 98.1°F | Resp 18 | Ht 70.0 in | Wt 93.9 lb

## 2023-04-12 VITALS — BP 153/89 | HR 53 | Resp 18

## 2023-04-12 DIAGNOSIS — C179 Malignant neoplasm of small intestine, unspecified: Secondary | ICD-10-CM

## 2023-04-12 DIAGNOSIS — Z5112 Encounter for antineoplastic immunotherapy: Secondary | ICD-10-CM | POA: Diagnosis not present

## 2023-04-12 DIAGNOSIS — Z95828 Presence of other vascular implants and grafts: Secondary | ICD-10-CM

## 2023-04-12 LAB — CBC WITH DIFFERENTIAL (CANCER CENTER ONLY)
Abs Immature Granulocytes: 0.04 10*3/uL (ref 0.00–0.07)
Basophils Absolute: 0 10*3/uL (ref 0.0–0.1)
Basophils Relative: 1 %
Eosinophils Absolute: 0.1 10*3/uL (ref 0.0–0.5)
Eosinophils Relative: 1 %
HCT: 39.6 % (ref 39.0–52.0)
Hemoglobin: 13.2 g/dL (ref 13.0–17.0)
Immature Granulocytes: 1 %
Lymphocytes Relative: 17 %
Lymphs Abs: 1.1 10*3/uL (ref 0.7–4.0)
MCH: 33.3 pg (ref 26.0–34.0)
MCHC: 33.3 g/dL (ref 30.0–36.0)
MCV: 100 fL (ref 80.0–100.0)
Monocytes Absolute: 0.5 10*3/uL (ref 0.1–1.0)
Monocytes Relative: 8 %
Neutro Abs: 4.7 10*3/uL (ref 1.7–7.7)
Neutrophils Relative %: 72 %
Platelet Count: 293 10*3/uL (ref 150–400)
RBC: 3.96 MIL/uL — ABNORMAL LOW (ref 4.22–5.81)
RDW: 13.5 % (ref 11.5–15.5)
WBC Count: 6.4 10*3/uL (ref 4.0–10.5)
nRBC: 0 % (ref 0.0–0.2)

## 2023-04-12 LAB — CMP (CANCER CENTER ONLY)
ALT: 22 U/L (ref 0–44)
AST: 15 U/L (ref 15–41)
Albumin: 3.7 g/dL (ref 3.5–5.0)
Alkaline Phosphatase: 86 U/L (ref 38–126)
Anion gap: 8 (ref 5–15)
BUN: 15 mg/dL (ref 8–23)
CO2: 29 mmol/L (ref 22–32)
Calcium: 9 mg/dL (ref 8.9–10.3)
Chloride: 102 mmol/L (ref 98–111)
Creatinine: 0.74 mg/dL (ref 0.61–1.24)
GFR, Estimated: >60 mL/min (ref >60)
Glucose, Bld: 116 mg/dL — ABNORMAL HIGH (ref 70–99)
Potassium: 4 mmol/L (ref 3.5–5.1)
Sodium: 139 mmol/L (ref 135–145)
Total Bilirubin: 0.5 mg/dL (ref 0.0–1.2)
Total Protein: 5.9 g/dL — ABNORMAL LOW (ref 6.5–8.1)

## 2023-04-12 LAB — TOTAL PROTEIN, URINE DIPSTICK: Protein, ur: NEGATIVE mg/dL

## 2023-04-12 LAB — CEA (ACCESS): CEA (CHCC): 6.18 ng/mL — ABNORMAL HIGH (ref 0.00–5.00)

## 2023-04-12 MED ORDER — SODIUM CHLORIDE 0.9% FLUSH
10.0000 mL | INTRAVENOUS | Status: DC | PRN
Start: 2023-04-12 — End: 2023-04-12
  Administered 2023-04-12: 10 mL via INTRAVENOUS

## 2023-04-12 MED ORDER — SODIUM CHLORIDE 0.9 % IV SOLN
7.5000 mg/kg | Freq: Once | INTRAVENOUS | Status: AC
  Administered 2023-04-12: 300 mg via INTRAVENOUS
  Filled 2023-04-12: qty 12

## 2023-04-12 MED ORDER — HEPARIN SOD (PORK) LOCK FLUSH 100 UNIT/ML IV SOLN: 500.0000 [IU] | Freq: Once | INTRAVENOUS | Status: DC

## 2023-04-12 MED ORDER — SODIUM CHLORIDE 0.9% FLUSH
10.0000 mL | INTRAVENOUS | Status: DC | PRN
  Administered 2023-04-12: 10 mL

## 2023-04-12 MED ORDER — SODIUM CHLORIDE 0.9 % IV SOLN
INTRAVENOUS | Status: DC
Start: 2023-04-12 — End: 2023-04-12

## 2023-04-12 MED ORDER — HEPARIN SOD (PORK) LOCK FLUSH 100 UNIT/ML IV SOLN
500.0000 [IU] | Freq: Once | INTRAVENOUS | Status: AC | PRN
  Administered 2023-04-12: 500 [IU]

## 2023-04-12 NOTE — Progress Notes (Signed)
 Patient seen by Dr. Thornton Papas today  Vitals are within treatment parameters:Yes   Labs are within treatment parameters: Yes   Treatment plan has been signed: Yes   Per physician team, Patient is ready for treatment and there are NO modifications to the treatment plan.

## 2023-04-12 NOTE — Patient Instructions (Signed)

## 2023-04-12 NOTE — Progress Notes (Signed)
 [facility] OFFICE PROGRESS NOTE   Diagnosis: Small bowel carcinoma  INTERVAL HISTORY:   Mr. Cradle returns as scheduled.  He reports malaise.  No other complaint.  Good appetite.  No difficulty with bowel function.  No pain.  Objective:  Vital signs in last 24 hours:  Blood pressure 129/77, pulse 78, temperature 98.1 F (36.7 C), temperature source Temporal, resp. rate 18, height 5\' 10"  (1.778 m), weight 93 lb 14.4 oz (42.6 kg), SpO2 98%.   Resp: Breath sounds, no respiratory distress Cardio: Regular rate and rhythm GI: Nontender, no mass, no apparent ascites, no hepatosplenomegaly Vascular: No leg edema  Portacath/PICC-without erythema  Lab Results:  Lab Results  Component Value Date   WBC 6.4 04/12/2023   HGB 13.2 04/12/2023   HCT 39.6 04/12/2023   MCV 100.0 04/12/2023   PLT 293 04/12/2023   NEUTROABS 4.7 04/12/2023    CMP  Lab Results  Component Value Date   NA 139 04/12/2023   K 4.0 04/12/2023   CL 102 04/12/2023   CO2 29 04/12/2023   GLUCOSE 116 (H) 04/12/2023   BUN 15 04/12/2023   CREATININE 0.74 04/12/2023   CALCIUM 9.0 04/12/2023   PROT 5.9 (L) 04/12/2023   ALBUMIN 3.7 04/12/2023   AST 15 04/12/2023   ALT 22 04/12/2023   ALKPHOS 86 04/12/2023   BILITOT 0.5 04/12/2023   GFRNONAA >60 04/12/2023   GFRAA  11/06/2008    >60        The eGFR has been calculated using the MDRD equation. This calculation has not been validated in all clinical situations. eGFR's persistently <60 mL/min signify possible Chronic Kidney Disease.    Lab Results  Component Value Date   CEA1 5.1 (H) 07/01/2022   CEA 6.18 (H) 04/12/2023     Medications: I have reviewed the patient's current medications.   Assessment/Plan: Small bowel carcinoma-stage IV (pT4,pN1,pM1 Admission 06/26/2022 with a small bowel obstruction CT abdomen/pelvis 06/26/2022-small bowel obstruction with transition in the lower abdomen/pelvis, irregular nodular densities in the  right lower lobe CT angiogram abdomen/pelvis 06/27/2022-circumferential masslike thickening at the terminal ileum, progressive peritoneal soft tissue at the right paracolic gutter, increase in a posterior lateral left bladder mass, resolution of small bowel dilation, increased distal esophageal wall thickening compared to October 2023 06/30/2022-exploratory laparotomy, right hemicolectomy, and peritoneal biopsy-firm mass at the distal ileum with peritoneal implants-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, tumor perforates the visceral peritoneum and invades the cecum, negative resection margins, 2/13 nodes, 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  No evidence of progressive disease. Cycle 4 FOLFOX 11/30/2022, oxaliplatin dose reduced Cycle 5 FOLFOX 12/19/2022, oxaliplatin dose reduced due to neutropenia, Neulasta Cycle 6 FOLFOX 01/02/2023, Neulasta held secondary to neutrophilia Cycle 7 FOLFOX 01/16/2023, Neulasta Cycle 8 FOLFOX 01/30/2023, Neulasta held secondary to neutrophilia CTs 02/12/2023: Slight increase in nodular omental thickening and ascites, stable pulmonary nodules-favor benign Cycle 1 Xeloda/Avastin 04/12/2023   2.  COPD 3.  CAD 4.  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  Enlarging bladder mass on CT 06/27/2022 6.  Mother with cervical cancer 7.   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  Admission 03/12/2023 with a COPD flare        Disposition: Austin [name] appears stable.  He has metastatic small bowel cancer.  The plan is to begin second line/maintenance treatment with low-dose/bevacizumab.  We again reviewed potential toxicities associated with these agents.  He agrees to proceed.  Mr. Chalfin will return for an office visit and bevacizumab in 3 weeks.  Thornton Papas, MD  04/12/2023  12:16 PM

## 2023-04-12 NOTE — Patient Instructions (Signed)
 [facility] A DEPT OF [facility]   Discharge Instructions: Thank you for choosing [facility] to provide your oncology and hematology care.   If you have a lab appointment with the Cancer Center, please go directly to the Cancer Center and check in at the registration area.   Wear comfortable clothing and clothing appropriate for easy access to any Portacath or PICC line.   We strive to give you quality time with your provider. You may need to reschedule your appointment if you arrive late (15 or more minutes).  Arriving late affects you and other patients whose appointments are after yours.  Also, if you miss three or more appointments without notifying the office, you may be dismissed from the clinic at the provider's discretion.      For prescription refill requests, have your pharmacy contact our office and allow 72 hours for refills to be completed.    Today you received the following chemotherapy and/or immunotherapy agents AVASTIN       To help prevent nausea and vomiting after your treatment, we encourage you to take your nausea medication as directed.  BELOW ARE SYMPTOMS THAT SHOULD BE REPORTED IMMEDIATELY: *FEVER GREATER THAN 100.4 F (38 C) OR HIGHER *CHILLS OR SWEATING *NAUSEA AND VOMITING THAT IS NOT CONTROLLED WITH YOUR NAUSEA MEDICATION *UNUSUAL SHORTNESS OF BREATH *UNUSUAL BRUISING OR BLEEDING *URINARY PROBLEMS (pain or burning when urinating, or frequent urination) *BOWEL PROBLEMS (unusual diarrhea, constipation, pain near the anus) TENDERNESS IN MOUTH AND THROAT WITH OR WITHOUT PRESENCE OF ULCERS (sore throat, sores in mouth, or a toothache) UNUSUAL RASH, SWELLING OR PAIN  UNUSUAL VAGINAL DISCHARGE OR ITCHING   Items with * indicate a potential emergency and should be followed up as soon as possible or go to the Emergency Department if any problems should occur.  Please show the CHEMOTHERAPY ALERT CARD or IMMUNOTHERAPY  ALERT CARD at check-in to the Emergency Department and triage nurse.  Should you have questions after your visit or need to cancel or reschedule your appointment, please contact [facility]  Dept: [phone]  and follow the prompts.  Office hours are 8:00 a.m. to 4:30 p.m. Monday - Friday. Please note that voicemails left after 4:00 p.m. may not be returned until the following business day.  We are closed weekends and major holidays. You have access to a nurse at all times for urgent questions. Please call the main number to the clinic Dept: [phone] and follow the prompts.   For any non-urgent questions, you may also contact your provider using MyChart. We now offer e-Visits for anyone 79 and older to request care online for non-urgent symptoms. For details visit [email or web address].   Also download the MyChart app! Go to the app store, search "MyChart", open the app, select [place], and log in with your MyChart username and password.  Bevacizumab Injection What is this medication? BEVACIZUMAB (be va SIZ yoo mab) treats some types of cancer. It works by blocking a protein that causes cancer cells to grow and multiply. This helps to slow or stop the spread of cancer cells. It is a monoclonal antibody. This medicine may be used for other purposes; ask your health care provider or pharmacist if you have questions. COMMON BRAND NAME(S): Alymsys, Avastin, MVASI, Rosaland Lao What should I tell my care team before I take this medication? They need to know if you have any  of these conditions: Blood clots Coughing up blood Having or recent surgery Heart failure High blood pressure History of a connection between 2 or more body parts that do not usually connect (fistula) History of a tear in your stomach or intestines Protein in your urine An unusual or allergic reaction to bevacizumab, other medications, foods, dyes, or  preservatives Pregnant or trying to get pregnant Breast-feeding How should I use this medication? This medication is injected into a vein. It is given by your care team in a hospital or clinic setting. Talk to your care team the use of this medication in children. Special care may be needed. Overdosage: If you think you have taken too much of this medicine contact a poison control center or emergency room at once. NOTE: This medicine is only for you. Do not share this medicine with others. What if I miss a dose? Keep appointments for follow-up doses. It is important not to miss your dose. Call your care team if you are unable to keep an appointment. What may interact with this medication? Interactions are not expected. This list may not describe all possible interactions. Give your health care provider a list of all the medicines, herbs, non-prescription drugs, or dietary supplements you use. Also tell them if you smoke, drink alcohol, or use illegal drugs. Some items may interact with your medicine. What should I watch for while using this medication? Your condition will be monitored carefully while you are receiving this medication. You may need blood work while taking this medication. This medication may make you feel generally unwell. This is not uncommon as chemotherapy can affect healthy cells as well as cancer cells. Report any side effects. Continue your course of treatment even though you feel ill unless your care team tells you to stop. This medication may increase your risk to bruise or bleed. Call your care team if you notice any unusual bleeding. Before having surgery, talk to your care team to make sure it is ok. This medication can increase the risk of poor healing of your surgical site or wound. You will need to stop this medication for 28 days before surgery. After surgery, wait at least 28 days before restarting this medication. Make sure the surgical site or wound is healed enough  before restarting this medication. Talk to your care team if questions. Talk to your care team if you may be pregnant. Serious birth defects can occur if you take this medication during pregnancy and for 6 months after the last dose. Contraception is recommended while taking this medication and for 6 months after the last dose. Your care team can help you find the option that works for you. Do not breastfeed while taking this medication and for 6 months after the last dose. This medication can cause infertility. Talk to your care team if you are concerned about your fertility. What side effects may I notice from receiving this medication? Side effects that you should report to your care team as soon as possible: Allergic reactions--skin rash, itching, hives, swelling of the face, lips, tongue, or throat Bleeding--bloody or black, tar-like stools, vomiting blood or brown material that looks like coffee grounds, red or dark brown urine, small red or purple spots on skin, unusual bruising or bleeding Blood clot--pain, swelling, or warmth in the leg, shortness of breath, chest pain Heart attack--pain or tightness in the chest, shoulders, arms, or jaw, nausea, shortness of breath, cold or clammy skin, feeling faint or lightheaded Heart failure--shortness of breath,  swelling of the ankles, feet, or hands, sudden weight gain, unusual weakness or fatigue Increase in blood pressure Infection--fever, chills, cough, sore throat, wounds that don't heal, pain or trouble when passing urine, general feeling of discomfort or being unwell Infusion reactions--chest pain, shortness of breath or trouble breathing, feeling faint or lightheaded Kidney injury--decrease in the amount of urine, swelling of the ankles, hands, or feet Stomach pain that is severe, does not go away, or gets worse Stroke--sudden numbness or weakness of the face, arm, or leg, trouble speaking, confusion, trouble walking, loss of balance or  coordination, dizziness, severe headache, change in vision Sudden and severe headache, confusion, change in vision, seizures, which may be signs of posterior reversible encephalopathy syndrome (PRES) Side effects that usually do not require medical attention (report to your care team if they continue or are bothersome): Back pain Change in taste Diarrhea Dry skin Increased tears Nosebleed This list may not describe all possible side effects. Call your doctor for medical advice about side effects. You may report side effects to FDA at 1-800-FDA-1088. Where should I keep my medication? This medication is given in a hospital or clinic. It will not be stored at home. NOTE: This sheet is a summary. It may not cover all possible information. If you have questions about this medicine, talk to your doctor, pharmacist, or health care provider.  2024 Elsevier/Gold Standard (2021-06-17 00:00:00)

## 2023-04-13 ENCOUNTER — Other Ambulatory Visit: Payer: Medicare (Managed Care)

## 2023-04-13 ENCOUNTER — Ambulatory Visit: Payer: Medicare (Managed Care) | Admitting: Oncology

## 2023-04-13 ENCOUNTER — Telehealth: Payer: Self-pay | Admitting: Emergency Medicine

## 2023-04-13 ENCOUNTER — Ambulatory Visit: Payer: Medicare (Managed Care)

## 2023-04-13 NOTE — Telephone Encounter (Signed)
 24 Hour Call Back 24 hour call back post 1st time Avastin infusion. Pt reports no problems. Patient reminded to call with any questions or concerns.

## 2023-04-24 ENCOUNTER — Other Ambulatory Visit: Payer: Self-pay

## 2023-04-24 ENCOUNTER — Other Ambulatory Visit: Payer: Self-pay | Admitting: Oncology

## 2023-04-24 DIAGNOSIS — C179 Malignant neoplasm of small intestine, unspecified: Secondary | ICD-10-CM

## 2023-04-24 MED ORDER — CAPECITABINE 500 MG PO TABS
ORAL_TABLET | ORAL | 0 refills | Status: DC
  Filled 2023-04-24: qty 70, 21d supply, fill #0

## 2023-04-24 NOTE — Progress Notes (Signed)
 Specialty Pharmacy Refill Coordination Note  Stclair Szymborski. is a 83 y.o. male contacted today regarding refills of specialty medication(s) Capecitabine (XELODA)   Patient requested Delivery   Delivery date: 04/30/23   Verified address: [address] [facility] [place] [address]   Medication will be filled on 04/27/23.

## 2023-04-24 NOTE — Progress Notes (Signed)
 Specialty Pharmacy Ongoing Clinical Assessment Note  Austin [name]. is a 83 y.o. male who is being followed by the specialty pharmacy service for RxSp Oncology   Patient's specialty medication(s) reviewed today: Capecitabine (XELODA)   Missed doses in the last 4 weeks: 0   Patient/Caregiver asked additional questions regarding taking Xeloda with water. Advised patient should take with water at least a few sips to a glass as tolerated and reiterated to take with food.  Therapeutic benefit summary: Unable to assess   Adverse events/side effects summary: Experienced adverse events/side effects (diarrhea - patient taking otc antidiarrheal and avoiding foods that he noticed caused diarrhea)   Patient's therapy is appropriate to: Continue    Goals Addressed             This Visit's Progress    Slow Disease Progression       Patient is unable to be assessed as therapy was recently initiated. Patient will maintain adherence          Follow up:  3 months  Otto Herb Specialty Pharmacist

## 2023-04-27 ENCOUNTER — Other Ambulatory Visit: Payer: Self-pay

## 2023-05-04 ENCOUNTER — Inpatient Hospital Stay (HOSPITAL_BASED_OUTPATIENT_CLINIC_OR_DEPARTMENT_OTHER): Payer: Medicare (Managed Care) | Admitting: Nurse Practitioner

## 2023-05-04 ENCOUNTER — Encounter: Payer: Self-pay | Admitting: Nurse Practitioner

## 2023-05-04 ENCOUNTER — Inpatient Hospital Stay: Payer: Medicare (Managed Care)

## 2023-05-04 ENCOUNTER — Inpatient Hospital Stay: Payer: Medicare (Managed Care) | Attending: Oncology

## 2023-05-04 VITALS — BP 150/84 | HR 86 | Temp 98.2°F | Resp 18 | Ht 70.0 in | Wt 91.5 lb

## 2023-05-04 VITALS — BP 176/83 | HR 81 | Temp 98.4°F | Resp 18

## 2023-05-04 DIAGNOSIS — Z95828 Presence of other vascular implants and grafts: Secondary | ICD-10-CM | POA: Insufficient documentation

## 2023-05-04 DIAGNOSIS — C179 Malignant neoplasm of small intestine, unspecified: Secondary | ICD-10-CM

## 2023-05-04 DIAGNOSIS — Z5112 Encounter for antineoplastic immunotherapy: Secondary | ICD-10-CM | POA: Insufficient documentation

## 2023-05-04 DIAGNOSIS — C180 Malignant neoplasm of cecum: Secondary | ICD-10-CM

## 2023-05-04 DIAGNOSIS — C172 Malignant neoplasm of ileum: Secondary | ICD-10-CM | POA: Diagnosis present

## 2023-05-04 LAB — CBC WITH DIFFERENTIAL (CANCER CENTER ONLY)
Abs Immature Granulocytes: 0.03 10*3/uL (ref 0.00–0.07)
Basophils Absolute: 0 10*3/uL (ref 0.0–0.1)
Basophils Relative: 0 %
Eosinophils Absolute: 0.1 10*3/uL (ref 0.0–0.5)
Eosinophils Relative: 1 %
HCT: 43.1 % (ref 39.0–52.0)
Hemoglobin: 14.4 g/dL (ref 13.0–17.0)
Immature Granulocytes: 0 %
Lymphocytes Relative: 12 %
Lymphs Abs: 1 10*3/uL (ref 0.7–4.0)
MCH: 33.3 pg (ref 26.0–34.0)
MCHC: 33.4 g/dL (ref 30.0–36.0)
MCV: 99.8 fL (ref 80.0–100.0)
Monocytes Absolute: 0.6 10*3/uL (ref 0.1–1.0)
Monocytes Relative: 7 %
Neutro Abs: 6.8 10*3/uL (ref 1.7–7.7)
Neutrophils Relative %: 80 %
Platelet Count: 206 10*3/uL (ref 150–400)
RBC: 4.32 MIL/uL (ref 4.22–5.81)
RDW: 15 % (ref 11.5–15.5)
WBC Count: 8.5 10*3/uL (ref 4.0–10.5)
nRBC: 0 % (ref 0.0–0.2)

## 2023-05-04 LAB — CMP (CANCER CENTER ONLY)
ALT: 18 U/L (ref 0–44)
AST: 19 U/L (ref 15–41)
Albumin: 4.1 g/dL (ref 3.5–5.0)
Alkaline Phosphatase: 96 U/L (ref 38–126)
Anion gap: 6 (ref 5–15)
BUN: 17 mg/dL (ref 8–23)
CO2: 32 mmol/L (ref 22–32)
Calcium: 9.6 mg/dL (ref 8.9–10.3)
Chloride: 101 mmol/L (ref 98–111)
Creatinine: 0.81 mg/dL (ref 0.61–1.24)
GFR, Estimated: >60 mL/min (ref >60)
Glucose, Bld: 93 mg/dL (ref 70–99)
Potassium: 4.2 mmol/L (ref 3.5–5.1)
Sodium: 139 mmol/L (ref 135–145)
Total Bilirubin: 0.7 mg/dL (ref 0.0–1.2)
Total Protein: 6.4 g/dL — ABNORMAL LOW (ref 6.5–8.1)

## 2023-05-04 MED ORDER — DIPHENOXYLATE-ATROPINE 2.5-0.025 MG PO TABS: 1.0000 | ORAL_TABLET | Freq: Four times a day (QID) | ORAL | 0 refills | Status: DC | PRN

## 2023-05-04 MED ORDER — ALTEPLASE 2 MG IJ SOLR
2.0000 mg | Freq: Once | INTRAMUSCULAR | Status: AC
  Administered 2023-05-04: 2 mg
  Filled 2023-05-04: qty 2

## 2023-05-04 MED ORDER — HEPARIN SOD (PORK) LOCK FLUSH 100 UNIT/ML IV SOLN: 500.0000 [IU] | Freq: Once | INTRAVENOUS | Status: DC | PRN

## 2023-05-04 MED ORDER — SODIUM CHLORIDE 0.9% FLUSH
10.0000 mL | INTRAVENOUS | Status: DC | PRN
Start: 2023-05-04 — End: 2023-05-04
  Administered 2023-05-04: 10 mL

## 2023-05-04 MED ORDER — SODIUM CHLORIDE 0.9% FLUSH
10.0000 mL | Freq: Once | INTRAVENOUS | Status: AC
  Administered 2023-05-04: 10 mL

## 2023-05-04 MED ORDER — SODIUM CHLORIDE 0.9 % IV SOLN: INTRAVENOUS | Status: DC

## 2023-05-04 MED ORDER — SODIUM CHLORIDE 0.9 % IV SOLN
7.5000 mg/kg | Freq: Once | INTRAVENOUS | Status: AC
  Administered 2023-05-04: 300 mg via INTRAVENOUS
  Filled 2023-05-04: qty 12

## 2023-05-04 NOTE — Progress Notes (Signed)
 Patient presents today for chemotherapy/immunotherapy infusion of MVASI. Patient is in satisfactory condition with no new complaints voiced.  Vital signs are stable.  Labs reviewed by Lonna Cobb NP during the office visit and all labs are within treatment parameters.  We will proceed with treatment per NP/MD orders.   Alteplase was administered during patients' port flush labs due to no blood return. Was able to aspirate of blood back and flush prior to treatment start.  Patient tolerated treatment well with no complaints voiced.  Patient left ambulatory in stable condition.  Vital signs stable at discharge.  Follow up as scheduled.

## 2023-05-04 NOTE — Progress Notes (Signed)
 Patient seen by Lonna Cobb NP today  Vitals are within treatment parameters:Yes   Labs are within treatment parameters: Yes   Treatment plan has been signed: Yes   Per physician team, Patient is ready for treatment and there are NO modifications to the treatment plan.

## 2023-05-04 NOTE — Patient Instructions (Signed)
 [facility] A DEPT OF [facility]  Discharge Instructions: Thank you for choosing [facility] to provide your oncology and hematology care.   If you have a lab appointment with the Cancer Center, please go directly to the Cancer Center and check in at the registration area.   Wear comfortable clothing and clothing appropriate for easy access to any Portacath or PICC line.   We strive to give you quality time with your provider. You may need to reschedule your appointment if you arrive late (15 or more minutes).  Arriving late affects you and other patients whose appointments are after yours.  Also, if you miss three or more appointments without notifying the office, you may be dismissed from the clinic at the provider's discretion.      For prescription refill requests, have your pharmacy contact our office and allow 72 hours for refills to be completed.    Today you received the following chemotherapy and/or immunotherapy agents MVASI.  Bevacizumab Injection What is this medication? BEVACIZUMAB (be va SIZ yoo mab) treats some types of cancer. It works by blocking a protein that causes cancer cells to grow and multiply. This helps to slow or stop the spread of cancer cells. It is a monoclonal antibody. This medicine may be used for other purposes; ask your health care provider or pharmacist if you have questions. COMMON BRAND NAME(S): Alymsys, Avastin, MVASI, Rosaland Lao What should I tell my care team before I take this medication? They need to know if you have any of these conditions: Blood clots Coughing up blood Having or recent surgery Heart failure High blood pressure History of a connection between 2 or more body parts that do not usually connect (fistula) History of a tear in your stomach or intestines Protein in your urine An unusual or allergic reaction to bevacizumab, other medications, foods, dyes, or  preservatives Pregnant or trying to get pregnant Breast-feeding How should I use this medication? This medication is injected into a vein. It is given by your care team in a hospital or clinic setting. Talk to your care team the use of this medication in children. Special care may be needed. Overdosage: If you think you have taken too much of this medicine contact a poison control center or emergency room at once. NOTE: This medicine is only for you. Do not share this medicine with others. What if I miss a dose? Keep appointments for follow-up doses. It is important not to miss your dose. Call your care team if you are unable to keep an appointment. What may interact with this medication? Interactions are not expected. This list may not describe all possible interactions. Give your health care provider a list of all the medicines, herbs, non-prescription drugs, or dietary supplements you use. Also tell them if you smoke, drink alcohol, or use illegal drugs. Some items may interact with your medicine. What should I watch for while using this medication? Your condition will be monitored carefully while you are receiving this medication. You may need blood work while taking this medication. This medication may make you feel generally unwell. This is not uncommon as chemotherapy can affect healthy cells as well as cancer cells. Report any side effects. Continue your course of treatment even though you feel ill unless your care team tells you to stop. This medication may increase your risk to bruise or bleed. Call your care team if you notice any unusual bleeding. Before having  surgery, talk to your care team to make sure it is ok. This medication can increase the risk of poor healing of your surgical site or wound. You will need to stop this medication for 28 days before surgery. After surgery, wait at least 28 days before restarting this medication. Make sure the surgical site or wound is healed enough  before restarting this medication. Talk to your care team if questions. Talk to your care team if you may be pregnant. Serious birth defects can occur if you take this medication during pregnancy and for 6 months after the last dose. Contraception is recommended while taking this medication and for 6 months after the last dose. Your care team can help you find the option that works for you. Do not breastfeed while taking this medication and for 6 months after the last dose. This medication can cause infertility. Talk to your care team if you are concerned about your fertility. What side effects may I notice from receiving this medication? Side effects that you should report to your care team as soon as possible: Allergic reactions--skin rash, itching, hives, swelling of the face, lips, tongue, or throat Bleeding--bloody or black, tar-like stools, vomiting blood or brown material that looks like coffee grounds, red or dark brown urine, small red or purple spots on skin, unusual bruising or bleeding Blood clot--pain, swelling, or warmth in the leg, shortness of breath, chest pain Heart attack--pain or tightness in the chest, shoulders, arms, or jaw, nausea, shortness of breath, cold or clammy skin, feeling faint or lightheaded Heart failure--shortness of breath, swelling of the ankles, feet, or hands, sudden weight gain, unusual weakness or fatigue Increase in blood pressure Infection--fever, chills, cough, sore throat, wounds that don't heal, pain or trouble when passing urine, general feeling of discomfort or being unwell Infusion reactions--chest pain, shortness of breath or trouble breathing, feeling faint or lightheaded Kidney injury--decrease in the amount of urine, swelling of the ankles, hands, or feet Stomach pain that is severe, does not go away, or gets worse Stroke--sudden numbness or weakness of the face, arm, or leg, trouble speaking, confusion, trouble walking, loss of balance or  coordination, dizziness, severe headache, change in vision Sudden and severe headache, confusion, change in vision, seizures, which may be signs of posterior reversible encephalopathy syndrome (PRES) Side effects that usually do not require medical attention (report to your care team if they continue or are bothersome): Back pain Change in taste Diarrhea Dry skin Increased tears Nosebleed This list may not describe all possible side effects. Call your doctor for medical advice about side effects. You may report side effects to FDA at 1-800-FDA-1088. Where should I keep my medication? This medication is given in a hospital or clinic. It will not be stored at home. NOTE: This sheet is a summary. It may not cover all possible information. If you have questions about this medicine, talk to your doctor, pharmacist, or health care provider.  2024 Elsevier/Gold Standard (2021-06-17 00:00:00)      To help prevent nausea and vomiting after your treatment, we encourage you to take your nausea medication as directed.  BELOW ARE SYMPTOMS THAT SHOULD BE REPORTED IMMEDIATELY: *FEVER GREATER THAN 100.4 F (38 C) OR HIGHER *CHILLS OR SWEATING *NAUSEA AND VOMITING THAT IS NOT CONTROLLED WITH YOUR NAUSEA MEDICATION *UNUSUAL SHORTNESS OF BREATH *UNUSUAL BRUISING OR BLEEDING *URINARY PROBLEMS (pain or burning when urinating, or frequent urination) *BOWEL PROBLEMS (unusual diarrhea, constipation, pain near the anus) TENDERNESS IN MOUTH AND THROAT WITH OR  WITHOUT PRESENCE OF ULCERS (sore throat, sores in mouth, or a toothache) UNUSUAL RASH, SWELLING OR PAIN  UNUSUAL VAGINAL DISCHARGE OR ITCHING   Items with * indicate a potential emergency and should be followed up as soon as possible or go to the Emergency Department if any problems should occur.  Please show the CHEMOTHERAPY ALERT CARD or IMMUNOTHERAPY ALERT CARD at check-in to the Emergency Department and triage nurse.  Should you have questions after  your visit or need to cancel or reschedule your appointment, please contact [facility]  Dept: [phone]  and follow the prompts.  Office hours are 8:00 a.m. to 4:30 p.m. Monday - Friday. Please note that voicemails left after 4:00 p.m. may not be returned until the following business day.  We are closed weekends and major holidays. You have access to a nurse at all times for urgent questions. Please call the main number to the clinic Dept: [phone] and follow the prompts.   For any non-urgent questions, you may also contact your provider using MyChart. We now offer e-Visits for anyone 51 and older to request care online for non-urgent symptoms. For details visit [email or web address].   Also download the MyChart app! Go to the app store, search "MyChart", open the app, select [facility], and log in with your MyChart username and password.

## 2023-05-04 NOTE — Progress Notes (Signed)
 [facility] OFFICE PROGRESS NOTE   Diagnosis: Small bowel carcinoma  INTERVAL HISTORY:   Austin [name] returns as scheduled.  He completed cycle 1 Xeloda/Avastin 04/12/2023.  He denies nausea/vomiting.  No mouth sores.  He has intermittent loose stools, no worse since beginning Xeloda.  He takes Lomotil if needed.  No hand or foot pain or redness.  He denies bleeding.  No symptom of thrombosis.  He has persistent numbness/tingling in the hands and feet.  This affects his balance.  Objective:  Vital signs in last 24 hours:  Blood pressure (!) 150/84, pulse 86, temperature 98.2 F (36.8 C), temperature source Temporal, resp. rate 18, height 5\' 10"  (1.778 m), weight 91 lb 8 oz (41.5 kg), SpO2 98%.    HEENT: No thrush or ulcers. Resp: Distant breath sounds.  No respiratory distress. Cardio: Regular rate and rhythm. GI: No hepatosplenomegaly. Vascular: No leg edema. Skin: Palms are dry appearing.  Feet with significant dry desquamation. Port-A-Cath without erythema.  Lab Results:  Lab Results  Component Value Date   WBC 8.5 05/04/2023   HGB 14.4 05/04/2023   HCT 43.1 05/04/2023   MCV 99.8 05/04/2023   PLT 206 05/04/2023   NEUTROABS 6.8 05/04/2023    Imaging:  No results found.  Medications: I have reviewed the patient's current medications.  Assessment/Plan: Small bowel carcinoma-stage IV (pT4,pN1,pM1 Admission 06/26/2022 with a small bowel obstruction CT abdomen/pelvis 06/26/2022-small bowel obstruction with transition in the lower abdomen/pelvis, irregular nodular densities in the right lower lobe CT angiogram abdomen/pelvis 06/27/2022-circumferential masslike thickening at the terminal ileum, progressive peritoneal soft tissue at the right paracolic gutter, increase in a posterior lateral left bladder mass, resolution of small bowel dilation, increased distal esophageal wall thickening compared to October 2023 06/30/2022-exploratory laparotomy, right  hemicolectomy, and peritoneal biopsy-firm mass at the distal ileum with peritoneal implants-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, tumor perforates the visceral peritoneum and invades the cecum, negative resection margins, 2/13 nodes, 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  No evidence of progressive disease. Cycle 4 FOLFOX 11/30/2022, oxaliplatin dose reduced Cycle 5 FOLFOX 12/19/2022, oxaliplatin dose reduced due to neutropenia, Neulasta Cycle 6 FOLFOX 01/02/2023, Neulasta held secondary to neutrophilia Cycle 7 FOLFOX 01/16/2023, Neulasta Cycle 8 FOLFOX 01/30/2023, Neulasta held secondary to neutrophilia CTs 02/12/2023: Slight increase in nodular omental thickening and ascites, stable pulmonary nodules-favor benign Cycle 1 Xeloda/Avastin 04/12/2023 Cycle 2 Xeloda/Avastin 05/03/2023, Avastin 05/04/2023   2.  COPD 3.  CAD 4.  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  Enlarging bladder mass on CT 06/27/2022 6.  Mother with cervical cancer 7.  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  Admission 03/12/2023 with a COPD flare    Disposition: Austin [name] appears stable.  He has completed 1 cycle of Xeloda/Avastin.  He seems to have tolerated well.  He reports the skin changes involving his feet are  chronic and not related to Xeloda.  He began cycle 2 Xeloda 05/03/2023.  He will receive Avastin today.  He is  transferring his care to the [place].  He anticipates this will occur prior to the next scheduled dose of Avastin.  He understands to contact his primary oncologist's office with progressive changes of hand-foot syndrome or other worrisome symptoms.  CBC and chemistry panel reviewed.  Labs adequate to proceed as above.  He would like to return for follow-up in 3 months.  We are available to see him sooner if he decides to transfer his care back to our office.  Patient seen with Dr. Truett Perna.  Lonna Cobb ANP/GNP-BC   05/04/2023  10:27 AM   This was a shared visit with Lonna Cobb.  Austin [name] tolerated the first cycle of Xeloda/Avastin well.  He has chronic dryness, skin thickening, and flaking at the hands and feet.  He plans to transfer his medical oncology care to the [place].  He would like to continue periodic follow-up here.  He will return in 3 months.  I am available to see him sooner as needed.  I was present for greater than 50% of today's visit.  I performed medical decision making.  Mancel Bale, MD

## 2023-05-05 ENCOUNTER — Other Ambulatory Visit: Payer: Self-pay

## 2023-05-14 ENCOUNTER — Other Ambulatory Visit (HOSPITAL_COMMUNITY): Payer: Self-pay

## 2023-05-17 ENCOUNTER — Other Ambulatory Visit: Payer: Self-pay

## 2023-05-17 ENCOUNTER — Other Ambulatory Visit (HOSPITAL_COMMUNITY): Payer: Self-pay

## 2023-05-18 ENCOUNTER — Other Ambulatory Visit: Payer: Self-pay

## 2023-05-19 ENCOUNTER — Other Ambulatory Visit: Payer: Self-pay

## 2023-05-20 ENCOUNTER — Other Ambulatory Visit: Payer: Self-pay | Admitting: Oncology

## 2023-05-21 ENCOUNTER — Telehealth: Payer: Self-pay | Admitting: Pharmacy Technician

## 2023-05-21 ENCOUNTER — Other Ambulatory Visit: Payer: Self-pay

## 2023-05-21 ENCOUNTER — Other Ambulatory Visit: Payer: Self-pay | Admitting: Oncology

## 2023-05-21 DIAGNOSIS — C179 Malignant neoplasm of small intestine, unspecified: Secondary | ICD-10-CM

## 2023-05-21 NOTE — Progress Notes (Signed)
 Specialty Pharmacy Refill Coordination Note  Austin [name]. is a 83 y.o. male contacted today regarding refills of specialty medication(s) Capecitabine (XELODA)   Patient requested Delivery   Delivery date: 05/23/23   Verified address: [address] [facility] [place]  [place] [address]   Medication will be filled on 04.08.25.   This fill date is pending response to refill request from provider. Patient is aware and if they have not received fill by intended date they must follow up with pharmacy.

## 2023-05-21 NOTE — Telephone Encounter (Signed)
?   Transfer care to [facility]

## 2023-05-21 NOTE — Progress Notes (Signed)
 Refill denied - patient is transitioning care to [facility] and oncology clinic will no longer prescribe. Clinic SPAA attempted to reach patient regarding transition of care but had to lvm. Please direct patient to Patty for further assistance if she is available.

## 2023-05-21 NOTE — Telephone Encounter (Signed)
 Oral Oncology Patient Advocate Encounter  Attempted to contact patient regarding his Xeloda prescription. No answer - LVM to call me back so we can discuss further.   He has been filling this med at our specialty pharmacy [facility]). However, per NP note from visit on 05/04/2023, patient stated that he will be transferring care to the [place].  I want to touch base with patient to make sure he understands that his medication will now need to come from the [place] and that he will be 'disenrolled' from the Oncology specialty program here at [facility].   Patty Almedia Balls, CPhT Oncology Pharmacy Patient Advocate [facility] Direct Number: [phone] Fax: [phone]

## 2023-05-22 ENCOUNTER — Other Ambulatory Visit: Payer: Self-pay | Admitting: Pharmacy Technician

## 2023-05-22 ENCOUNTER — Encounter: Payer: Self-pay | Admitting: Oncology

## 2023-05-22 NOTE — Progress Notes (Signed)
 Oral Oncology Patient Advocate Encounter  Patient has been 'disenrolled' from the program due to transferring care to the [place].  Patty Almedia Balls, CPhT Oncology Pharmacy Patient Advocate [facility] Direct Number: [phone] Fax: [phone]

## 2023-05-22 NOTE — Telephone Encounter (Signed)
 Oral Oncology Patient Advocate Encounter  Spoke with patient's son Austin [name], he stated that his father understands that he will be 'disenrolled' from [facility] and that he will need to get medication from the [place] since he is transferring care.  I will 'disenroll' patient from [facility].  Patty Almedia Balls, CPhT Oncology Pharmacy Patient Advocate [facility] Direct Number: [phone] Fax: [phone]

## 2023-05-24 ENCOUNTER — Ambulatory Visit: Payer: Medicare (Managed Care) | Attending: Cardiovascular Disease | Admitting: Cardiovascular Disease

## 2023-05-24 NOTE — Progress Notes (Deleted)
 No chief complaint on file.   History of Present Illness: 83 yo male with history of CAD, HTN, HLD and COPD who is being seen today for cardiac follow up. 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 A Taxus DES was placed in the mid RCA. A Cypher DES was placed in OM1. A Taxus DES was placed in the proximal Circumflex. He has had no cardiac catheterization since 2005. Exercise stress test July 2018 at the [facility] without ischemia. We have discussed updating his echo but he does not wish to do this.   He is here today for follow up. The patient denies any chest pain, dyspnea, palpitations, lower extremity edema, orthopnea, PND, dizziness, near syncope or syncope.    Primary Care Physician: [facility]  Past Medical History:  Diagnosis Date   CAD (coronary artery disease)    COPD (chronic obstructive pulmonary disease) (HCC)    Dyspnea on exertion    GERD (gastroesophageal reflux disease)    Hyperlipidemia    MI (myocardial infarction) (HCC)    Rectal bleeding    SBO (small bowel obstruction) (HCC)    Sept '23   Tobacco abuse    Unspecified essential hypertension     Past Surgical History:  Procedure Laterality Date   CORONARY ANGIOPLASTY WITH STENT PLACEMENT     Drug eluting stent placed to the first obtuse marginal with a 2.58 mm cypher drug eluting stent. Circumflex was treated with 3.0 x 18 mm TAXUS drug-eluting stent on 07/17/03   ENTEROSCOPY N/A 11/07/2021   Procedure: ENTEROSCOPY;  Surgeon: Charlott Rakes, MD;  Location: [facility] ENDOSCOPY;  Service: Gastroenterology;  Laterality: N/A;   EYE SURGERY  1971   bilateral   IR IMAGING GUIDED PORT INSERTION  10/02/2022   LAPAROSCOPY N/A 06/30/2022   Procedure: LAPAROSCOPY DIAGNOSTIC;  Surgeon: Manus Rudd, MD;  Location: [facility] OR;  Service: General;  Laterality: N/A;   LAPAROTOMY N/A 06/30/2022   Procedure: EXPLORATORY LAPAROTOMY, RIGHT HEMICOLECTOMY, PERITONEAL BIOPSY;  Surgeon: Manus Rudd, MD;  Location: [facility]  OR;  Service: General;  Laterality: N/A;   PTCA     And drug-eluting stent to the RCA   RECTAL SURGERY  2008   [name] did this surgery    Current Outpatient Medications  Medication Sig Dispense Refill   albuterol (VENTOLIN HFA) 108 (90 Base) MCG/ACT inhaler Inhale 2 puffs into the lungs every 4 (four) hours as needed for wheezing or shortness of breath.     atorvastatin (LIPITOR) 40 MG tablet Take 1 tablet (40 mg total) by mouth at bedtime. Please keep scheduled appointment for future refills. Thank you. (Patient taking differently: Take 40 mg by mouth at bedtime.) 90 tablet 0   capecitabine (XELODA) 500 MG tablet Take 3 tablets (1500mg ) by mouth in AM and 2 tablets (1000mg ) in PM. Take with food. Take for 14 days, then hold for 7 days. Repeat every 21 days. Start on 05/04/23 after MD visit 70 tablet 0   clopidogrel (PLAVIX) 75 MG tablet Take 1 tablet (75 mg total) by mouth daily. Please keep scheduled appointment for future refills. Thank you. (Patient taking differently: Take 75 mg by mouth daily.) 90 tablet 0   diphenoxylate-atropine (LOMOTIL) 2.5-0.025 MG tablet Take 1 tablet by mouth 4 (four) times daily as needed for diarrhea or loose stools. 30 tablet 0   lidocaine-prilocaine (EMLA) cream Apply 1 Application topically as needed (Apply to port site 1 hour prior to appt, start using with second treatment). 30  g 0   losartan (COZAAR) 25 MG tablet Take 1 tablet (25 mg total) by mouth daily. Please keep scheduled appointment for future refills. Thank you. (Patient taking differently: Take 25 mg by mouth daily.) 90 tablet 0   metoprolol succinate (TOPROL-XL) 25 MG 24 hr tablet TAKE 1 TABLET DAILY 30 tablet 0   mometasone-formoterol (DULERA) 100-5 MCG/ACT AERO Inhale 2 puffs into the lungs 2 (two) times daily as needed for shortness of breath or wheezing.     nitroGLYCERIN (NITROSTAT) 0.4 MG SL tablet Place 1 tablet (0.4 mg total) under the tongue as directed. (Patient not taking: Reported on  03/26/2023) 25 tablet 3   NON FORMULARY Take 2 capsules by mouth See admin instructions. Balance of [employer] Veg[employer] Supplements- Take 2 capsules OF EACH FORMULATION by mouth once a day     NON FORMULARY Take 2 Scoops by mouth See admin instructions. Balance of Nature Fiber & SpiceT Supplement: Mix 2 scoopsful of powder into 16 ounces of water and drink by mouth once a day     ondansetron (ZOFRAN) 8 MG tablet Take 4-8 mg by mouth every 8 (eight) hours as needed for nausea or vomiting. (Patient not taking: Reported on 03/26/2023)     polyethylene glycol (MIRALAX / GLYCOLAX) 17 g packet Take 17 g by mouth daily as needed for moderate constipation or severe constipation (if not helped with colace). (Patient not taking: Reported on 03/26/2023) 14 each 0   prochlorperazine (COMPAZINE) 5 MG tablet Take 1 tablet (5 mg total) by mouth every 6 (six) hours as needed for nausea or vomiting. (Patient not taking: Reported on 03/26/2023) 30 tablet 1   SPIRIVA RESPIMAT 2.5 MCG/ACT AERS Inhale 2 puffs into the lungs daily.     triamcinolone ointment (KENALOG) 0.1 % Apply 1 Application topically 2 (two) times daily as needed (for dryness- thin layer).     No current facility-administered medications for this visit.    Allergies  Allergen Reactions   Ramipril Cough    Social History   Socioeconomic History   Marital status: Married    Spouse name: Not on file   Number of children: Not on file   Years of education: Not on file   Highest education level: Not on file  Occupational History   Occupation: Retired Publishing rights manager: RETIRED   Occupation: Runs a cardiac rehab  Tobacco Use   Smoking status: Former    Current packs/day: 1.00    Types: Cigarettes   Smokeless tobacco: Never   Tobacco comments:    Smoked for about 40 years, smokes cigarette every once in a while, maybe a pack a year.  Vaping Use   Vaping status: Never Used  Substance and Sexual Activity   Alcohol use: Not  Currently   Drug use: No   Sexual activity: Not on file  Other Topics Concern   Not on file  Social History Narrative   Lives in [place] with his wife   [place] veteran   Exercises 3 times a week at the [employer]   Social Drivers of [employer]: Low Risk  (07/18/2022)   Overall Financial Resource Strain (CARDIA)    Difficulty of Paying Living Expenses: Not hard at all  Food Insecurity: No Food Insecurity (03/12/2023)   Hunger Vital Sign    Worried About Running Out of Food in the Last Year: Never true    Ran Out of Food in the Last Year: Never  true  Transportation Needs: No Transportation Needs (03/12/2023)   PRAPARE - Administrator, Civil Service (Medical): No    Lack of Transportation (Non-Medical): No  Physical Activity: Not on file  Stress: Not on file  Social Connections: Unknown (03/12/2023)   Social Connection and Isolation Panel [NHANES]    Frequency of Communication with Friends and Family: More than three times a week    Frequency of Social Gatherings with Friends and Family: Not on file    Attends Religious Services: More than 4 times per year    Active Member of [employer] or Organizations: Patient declined    Attends Banker Meetings: Patient declined    Marital Status: Married  Catering manager Violence: Not At Risk (03/12/2023)   Humiliation, Afraid, Rape, and Kick questionnaire    Fear of Current or Ex-Partner: No    Emotionally Abused: No    Physically Abused: No    Sexually Abused: No    Family History  Problem Relation Age of Onset   Cancer Mother        cervical   Lung disease Father        Black lung    Review of Systems:  As stated in the HPI and otherwise negative.   There were no vitals taken for this visit.  No BP recorded.  {Refresh Note OR Click here to enter BP  :1}***    Physical Examination: General: Well developed, well nourished, NAD  HEENT: OP clear, mucus membranes moist  SKIN:  warm, dry. No rashes. Neuro: No focal deficits  Musculoskeletal: Muscle strength 5/5 all ext  Psychiatric: Mood and affect normal  Neck: No JVD, no carotid bruits, no thyromegaly, no lymphadenopathy.  Lungs:Clear bilaterally, no wheezes, rhonci, crackles Cardiovascular: Regular rate and rhythm. No murmurs, gallops or rubs. Abdomen:Soft. Bowel sounds present. Non-tender.  Extremities: No lower extremity edema. Pulses are 2 + in the bilateral DP/PT.  EKG:  EKG is *** ordered today. The ekg ordered today demonstrates   Recent Labs: 06/27/2022: TSH 1.698 07/02/2022: Magnesium 2.1 03/12/2023: B Natriuretic Peptide 155.9 05/04/2023: ALT 18; BUN 17; Creatinine 0.81; Hemoglobin 14.4; Platelet Count 206; Potassium 4.2; Sodium 139   Lipid Panel    Component Value Date/Time   CHOL 142 01/05/2014 0919   TRIG 150.0 (H) 01/05/2014 0919   HDL 32.80 (L) 01/05/2014 0919   CHOLHDL 4 01/05/2014 0919   VLDL 30.0 01/05/2014 0919   LDLCALC 79 01/05/2014 0919     Wt Readings from Last 3 Encounters:  05/04/23 41.5 kg  04/12/23 42.6 kg  03/26/23 41.1 kg    Assessment and Plan:   1. CAD without angina: No chest pain. Continue Plavix, statin and beta blocker.      2. HYPERTENSION: BP is controlled. Continue Toprol and Losartan. ***     3. TOBACCO ABUSE: He stopped smoking  4. HYPERLIPIDEMIA: Lipids followed at the [place]. I cannot see the most recent results. Well controlled per the pt. Continue statin.      Labs/ tests ordered today include:  No orders of the defined types were placed in this encounter.  Disposition:   F/U with me in 12 months  Signed, Verne Carrow, MD 05/24/2023 6:10 AM    [facility] [address] [employer], [place], [place]  [address] Phone: [phone]; Fax: [phone]

## 2023-05-25 ENCOUNTER — Encounter: Payer: Self-pay | Admitting: Cardiovascular Disease

## 2023-06-01 ENCOUNTER — Encounter: Payer: Self-pay | Admitting: Oncology

## 2023-06-01 ENCOUNTER — Other Ambulatory Visit (HOSPITAL_COMMUNITY): Payer: Self-pay

## 2023-06-01 DIAGNOSIS — C179 Malignant neoplasm of small intestine, unspecified: Secondary | ICD-10-CM

## 2023-06-05 ENCOUNTER — Ambulatory Visit (HOSPITAL_BASED_OUTPATIENT_CLINIC_OR_DEPARTMENT_OTHER): Admission: RE | Admit: 2023-06-05 | Source: Ambulatory Visit

## 2023-06-05 ENCOUNTER — Encounter (HOSPITAL_BASED_OUTPATIENT_CLINIC_OR_DEPARTMENT_OTHER): Payer: Self-pay

## 2023-06-06 ENCOUNTER — Other Ambulatory Visit: Payer: Self-pay | Admitting: Cardiovascular Disease

## 2023-06-10 ENCOUNTER — Other Ambulatory Visit: Payer: Self-pay | Admitting: Oncology

## 2023-06-15 ENCOUNTER — Ambulatory Visit (HOSPITAL_BASED_OUTPATIENT_CLINIC_OR_DEPARTMENT_OTHER)
Admission: RE | Admit: 2023-06-15 | Discharge: 2023-06-15 | Disposition: A | Discharge status: Home / Self Care | Payer: Medicare (Managed Care) | Source: Ambulatory Visit

## 2023-06-15 DIAGNOSIS — C179 Malignant neoplasm of small intestine, unspecified: Secondary | ICD-10-CM | POA: Diagnosis present

## 2023-06-15 MED ORDER — IOHEXOL 300 MG/ML  SOLN
100.0000 mL | Freq: Once | INTRAMUSCULAR | Status: AC | PRN
  Administered 2023-06-15: 100 mL via INTRAVENOUS

## 2023-07-12 ENCOUNTER — Other Ambulatory Visit (HOSPITAL_COMMUNITY): Payer: Self-pay

## 2023-07-23 ENCOUNTER — Telehealth: Payer: Self-pay

## 2023-07-23 NOTE — Telephone Encounter (Signed)
 The patient's son called to inform us  that the patient passed away at home on 22-Jul-2023.

## 2023-08-03 ENCOUNTER — Ambulatory Visit: Payer: Medicare (Managed Care) | Admitting: Oncology

## 2023-08-14 DEATH — deceased

## 2023-08-23 ENCOUNTER — Encounter: Payer: Self-pay | Admitting: Oncology

## 2023-10-01 ENCOUNTER — Encounter: Payer: Self-pay | Admitting: Oncology

## 2023-10-20 ENCOUNTER — Other Ambulatory Visit: Payer: Self-pay
